# Patient Record
Sex: Female | Born: 1985 | Race: Black or African American | Hispanic: No | Marital: Single | Smoking: Current every day smoker
Health system: Southern US, Community
[De-identification: ages and names within clinical notes are randomized; demographics above are authoritative.]

## PROBLEM LIST (undated history)

## (undated) DIAGNOSIS — Z8619 Personal history of other infectious and parasitic diseases: Secondary | ICD-10-CM

## (undated) DIAGNOSIS — IMO0002 Reserved for concepts with insufficient information to code with codable children: Secondary | ICD-10-CM

## (undated) DIAGNOSIS — M549 Dorsalgia, unspecified: Secondary | ICD-10-CM

## (undated) DIAGNOSIS — F419 Anxiety disorder, unspecified: Secondary | ICD-10-CM

## (undated) DIAGNOSIS — F99 Mental disorder, not otherwise specified: Secondary | ICD-10-CM

## (undated) DIAGNOSIS — Z8742 Personal history of other diseases of the female genital tract: Secondary | ICD-10-CM

## (undated) DIAGNOSIS — N3941 Urge incontinence: Secondary | ICD-10-CM

## (undated) DIAGNOSIS — Z3490 Encounter for supervision of normal pregnancy, unspecified, unspecified trimester: Secondary | ICD-10-CM

## (undated) DIAGNOSIS — Z8709 Personal history of other diseases of the respiratory system: Secondary | ICD-10-CM

## (undated) DIAGNOSIS — O039 Complete or unspecified spontaneous abortion without complication: Secondary | ICD-10-CM

## (undated) DIAGNOSIS — R112 Nausea with vomiting, unspecified: Secondary | ICD-10-CM

## (undated) DIAGNOSIS — IMO0001 Reserved for inherently not codable concepts without codable children: Secondary | ICD-10-CM

## (undated) DIAGNOSIS — O219 Vomiting of pregnancy, unspecified: Secondary | ICD-10-CM

## (undated) DIAGNOSIS — J45909 Unspecified asthma, uncomplicated: Secondary | ICD-10-CM

## (undated) DIAGNOSIS — R87629 Unspecified abnormal cytological findings in specimens from vagina: Secondary | ICD-10-CM

## (undated) HISTORY — DX: Unspecified abnormal cytological findings in specimens from vagina: R87.629

## (undated) HISTORY — DX: Unspecified asthma, uncomplicated: J45.909

## (undated) HISTORY — PX: MOUTH SURGERY: SHX715

## (undated) HISTORY — DX: Personal history of other diseases of the respiratory system: Z87.09

## (undated) HISTORY — DX: Personal history of other diseases of the female genital tract: Z87.42

## (undated) HISTORY — DX: Reserved for concepts with insufficient information to code with codable children: IMO0002

## (undated) HISTORY — DX: Mental disorder, not otherwise specified: F99

## (undated) HISTORY — DX: Encounter for supervision of normal pregnancy, unspecified, unspecified trimester: Z34.90

## (undated) HISTORY — DX: Anxiety disorder, unspecified: F41.9

## (undated) HISTORY — DX: Personal history of other infectious and parasitic diseases: Z86.19

## (undated) HISTORY — DX: Urge incontinence: N39.41

## (undated) HISTORY — DX: Vomiting of pregnancy, unspecified: O21.9

## (undated) HISTORY — DX: Dorsalgia, unspecified: M54.9

## (undated) HISTORY — PX: OTHER SURGICAL HISTORY: SHX169

## (undated) HISTORY — DX: Nausea with vomiting, unspecified: R11.2

---
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
## 2006-10-15 ENCOUNTER — Emergency Department (HOSPITAL_COMMUNITY): Admission: EM | Admit: 2006-10-15 | Discharge: 2006-10-16 | Payer: Self-pay | Admitting: Emergency Medicine

## 2007-05-09 ENCOUNTER — Emergency Department (HOSPITAL_COMMUNITY): Admission: EM | Admit: 2007-05-09 | Discharge: 2007-05-09 | Payer: Self-pay | Admitting: Emergency Medicine

## 2007-05-11 ENCOUNTER — Emergency Department (HOSPITAL_COMMUNITY): Admission: EM | Admit: 2007-05-11 | Discharge: 2007-05-11 | Payer: Self-pay | Admitting: Emergency Medicine

## 2007-06-02 ENCOUNTER — Emergency Department (HOSPITAL_COMMUNITY): Admission: EM | Admit: 2007-06-02 | Discharge: 2007-06-02 | Payer: Self-pay | Admitting: Emergency Medicine

## 2007-07-31 ENCOUNTER — Other Ambulatory Visit: Admission: RE | Admit: 2007-07-31 | Discharge: 2007-07-31 | Payer: Self-pay | Admitting: Obstetrics and Gynecology

## 2007-11-24 ENCOUNTER — Emergency Department (HOSPITAL_COMMUNITY): Admission: EM | Admit: 2007-11-24 | Discharge: 2007-11-24 | Payer: Self-pay | Admitting: Emergency Medicine

## 2008-03-13 ENCOUNTER — Emergency Department (HOSPITAL_COMMUNITY): Admission: EM | Admit: 2008-03-13 | Discharge: 2008-03-13 | Payer: Self-pay | Admitting: Emergency Medicine

## 2009-03-30 ENCOUNTER — Other Ambulatory Visit: Admission: RE | Admit: 2009-03-30 | Discharge: 2009-03-30 | Payer: Self-pay | Admitting: Obstetrics and Gynecology

## 2010-03-01 ENCOUNTER — Emergency Department (HOSPITAL_COMMUNITY)
Admission: EM | Admit: 2010-03-01 | Discharge: 2010-03-01 | Disposition: A | Discharge status: Home / Self Care | Payer: Medicaid Other | Attending: Emergency Medicine | Admitting: Emergency Medicine

## 2010-03-01 ENCOUNTER — Emergency Department (HOSPITAL_COMMUNITY)
Admission: EM | Admit: 2010-03-01 | Discharge: 2010-03-02 | Disposition: A | Discharge status: Home / Self Care | Payer: Medicaid Other | Attending: Emergency Medicine | Admitting: Emergency Medicine

## 2010-03-01 DIAGNOSIS — R5381 Other malaise: Secondary | ICD-10-CM | POA: Insufficient documentation

## 2010-03-01 DIAGNOSIS — M545 Low back pain, unspecified: Secondary | ICD-10-CM | POA: Insufficient documentation

## 2010-03-01 DIAGNOSIS — R5383 Other fatigue: Secondary | ICD-10-CM | POA: Insufficient documentation

## 2010-03-01 LAB — URINALYSIS, ROUTINE W REFLEX MICROSCOPIC
Bilirubin Urine: NEGATIVE
Hgb urine dipstick: NEGATIVE
Ketones, ur: NEGATIVE mg/dL
Nitrite: NEGATIVE
Protein, ur: NEGATIVE mg/dL
Specific Gravity, Urine: >1.03 — ABNORMAL HIGH (ref 1.005–1.030)
Urine Glucose, Fasting: NEGATIVE mg/dL
Urobilinogen, UA: 0.2 mg/dL (ref 0.0–1.0)
pH: 6 (ref 5.0–8.0)

## 2010-03-01 LAB — PREGNANCY, URINE: Preg Test, Ur: NEGATIVE

## 2010-03-02 LAB — URINALYSIS, ROUTINE W REFLEX MICROSCOPIC
Bilirubin Urine: NEGATIVE
Nitrite: NEGATIVE
Protein, ur: NEGATIVE mg/dL
Urobilinogen, UA: 0.2 mg/dL (ref 0.0–1.0)

## 2010-03-02 LAB — PREGNANCY, URINE: Preg Test, Ur: NEGATIVE

## 2010-08-18 ENCOUNTER — Encounter: Payer: Self-pay | Admitting: *Deleted

## 2010-08-18 ENCOUNTER — Emergency Department (HOSPITAL_COMMUNITY)
Admission: EM | Admit: 2010-08-18 | Discharge: 2010-08-19 | Disposition: A | Discharge status: Home / Self Care | Payer: Self-pay | Attending: Emergency Medicine | Admitting: Emergency Medicine

## 2010-08-18 DIAGNOSIS — N939 Abnormal uterine and vaginal bleeding, unspecified: Secondary | ICD-10-CM

## 2010-08-18 DIAGNOSIS — N760 Acute vaginitis: Secondary | ICD-10-CM | POA: Insufficient documentation

## 2010-08-18 DIAGNOSIS — N898 Other specified noninflammatory disorders of vagina: Secondary | ICD-10-CM | POA: Insufficient documentation

## 2010-08-18 DIAGNOSIS — N390 Urinary tract infection, site not specified: Secondary | ICD-10-CM

## 2010-08-18 DIAGNOSIS — B9689 Other specified bacterial agents as the cause of diseases classified elsewhere: Secondary | ICD-10-CM

## 2010-08-18 DIAGNOSIS — F17200 Nicotine dependence, unspecified, uncomplicated: Secondary | ICD-10-CM | POA: Insufficient documentation

## 2010-08-18 DIAGNOSIS — A499 Bacterial infection, unspecified: Secondary | ICD-10-CM | POA: Insufficient documentation

## 2010-08-18 LAB — POCT I-STAT, CHEM 8
BUN: 4 mg/dL — ABNORMAL LOW (ref 6–23)
Creatinine, Ser: 0.6 mg/dL (ref 0.50–1.10)
Glucose, Bld: 88 mg/dL (ref 70–99)
Hemoglobin: 12.6 g/dL (ref 12.0–15.0)
Potassium: 3.4 mEq/L — ABNORMAL LOW (ref 3.5–5.1)

## 2010-08-18 LAB — URINE MICROSCOPIC-ADD ON

## 2010-08-18 LAB — URINALYSIS, ROUTINE W REFLEX MICROSCOPIC
Glucose, UA: NEGATIVE mg/dL
Leukocytes, UA: NEGATIVE
Specific Gravity, Urine: 1.02 (ref 1.005–1.030)
Urobilinogen, UA: 0.2 mg/dL (ref 0.0–1.0)

## 2010-08-18 LAB — PREGNANCY, URINE: Preg Test, Ur: NEGATIVE

## 2010-08-18 NOTE — ED Provider Notes (Signed)
History     Chief Complaint  Patient presents with  . Vaginal Bleeding   HPI  Pt was seen at 2250.  Per pt, c/o gradual onset and persistence of constant vaginal bleeding x2 weeks.  Has been assoc with lower pelvic "cramping" pain.  Has been "sometimes" taking OTC motrin.  Has not seen her usual OB/GYN at [facility] for same.  Denies N/V/D, no rash, no back pain, no fevers, no syncope/near syncope.   History reviewed. No pertinent past medical history.  History reviewed. No pertinent past surgical history.  No family history on file.  History  Substance Use Topics  . Smoking status: Current Everyday Smoker -- 0.5 packs/day    Types: Cigarettes  . Smokeless tobacco: Not on file  . Alcohol Use: No    OB History    Grav Para Term Preterm Abortions TAB SAB Ect Mult Living                  Review of Systems ROS: Statement: All systems negative except as marked or noted in the HPI; Constitutional: Negative for fever and chills. ; ; Eyes: Negative for eye pain and discharge. ; ; ENMT: Negative for ear pain, hoarseness, nasal congestion, sinus pressure and sore throat. ; ; Cardiovascular: Negative for chest pain, palpitations, diaphoresis, dyspnea and peripheral edema. ; ; Respiratory: Negative for cough, wheezing and stridor. ; ; Gastrointestinal: Negative for nausea, vomiting, diarrhea and abdominal pain. ; ; Genitourinary: Negative for dysuria, flank pain and hematuria. +vaginal bleeding and pelvic cramping ; ; Musculoskeletal: Negative for back pain and neck pain. ; ; Skin: Negative for rash and skin lesion. ; ; Neuro: Negative for headache, lightheadedness and neck stiffness. ;    Physical Exam  BP 125/65  Pulse 82  Temp(Src) 98.5 F (36.9 C) (Oral)  Resp 18  Ht 5\' 2"  (1.575 m)  Wt 165 lb (74.844 kg)  BMI 30.18 kg/m2  SpO2 100%  LMP 08/04/2010  Physical Exam 2300: Physical examination:  Nursing notes reviewed; Vital signs and O2 SAT reviewed;  Constitutional: Well  developed, Well nourished, Well hydrated, In no acute distress; Head:  Normocephalic, atraumatic; Eyes: EOMI, PERRL, No scleral icterus; ENMT: Mouth and pharynx normal, Mucous membranes moist; Neck: Supple, Full range of motion, No lymphadenopathy; Cardiovascular: Regular rate and rhythm, No murmur, rub, or gallop; Respiratory: Breath sounds clear & equal bilaterally, No rales, rhonchi, wheezes, or rub, Normal respiratory effort/excursion; Chest: Nontender, Movement normal; Abdomen: Soft, Nontender, Nondistended, Normal bowel sounds; Genitourinary: No CVA tenderness; Pelvic exam performed with permission of pt and female ED tech assist during exam.  External genitalia w/o lesions. Vaginal vault without discharge, +dark blood in vault.  Cervix w/o lesions, not friable, GC/chlam and wet prep obtained and sent to lab.  Bimanual exam w/o CMT, uterine or adenexal tenderness. Extremities: Pulses normal, No tenderness, No edema, No calf edema or asymmetry.; Neuro: AA&Ox3, Major CN grossly intact.  No gross focal motor or sensory deficits in extremities.; Skin: Color normal, Warm, Dry.   ED Course  Procedures  MDM MDM Reviewed: vitals and nursing note Interpretation: labs   Results for orders placed during the hospital encounter of 08/18/10  URINALYSIS, ROUTINE W REFLEX MICROSCOPIC      Component Value Range   Color, Urine YELLOW  YELLOW    Appearance HAZY (*) CLEAR    Specific Gravity, Urine 1.020  1.005 - 1.030    pH 7.0  5.0 - 8.0    Glucose, UA NEGATIVE  NEGATIVE (mg/dL)   Hgb urine dipstick TRACE (*) NEGATIVE    Bilirubin Urine NEGATIVE  NEGATIVE    Ketones, ur NEGATIVE  NEGATIVE (mg/dL)   Protein, ur NEGATIVE  NEGATIVE (mg/dL)   Urobilinogen, UA 0.2  0.0 - 1.0 (mg/dL)   Nitrite NEGATIVE  NEGATIVE    Leukocytes, UA NEGATIVE  NEGATIVE   PREGNANCY, URINE      Component Value Range   Preg Test, Ur NEGATIVE    WET PREP, GENITAL      Component Value Range   Yeast, Wet Prep NONE SEEN  NONE  SEEN    Trich, Wet Prep NONE SEEN  NONE SEEN    Clue Cells, Wet Prep FEW (*) NONE SEEN    WBC, Wet Prep HPF POC FEW (*) NONE SEEN   POCT I-STAT, CHEM 8      Component Value Range   Sodium 141  135 - 145 (mEq/L)   Potassium 3.4 (*) 3.5 - 5.1 (mEq/L)   Chloride 107  96 - 112 (mEq/L)   BUN 4 (*) 6 - 23 (mg/dL)   Creatinine, Ser 6.44  0.50 - 1.10 (mg/dL)   Glucose, Bld 88  70 - 99 (mg/dL)   Calcium, Ion 0.34  7.42 - 1.32 (mmol/L)   TCO2 23  0 - 100 (mmol/L)   Hemoglobin 12.6  12.0 - 15.0 (g/dL)   HCT 59.5  63.8 - 75.6 (%)  URINE MICROSCOPIC-ADD ON      Component Value Range   Squamous Epithelial / LPF FEW (*) RARE    WBC, UA 0-2  <3 (WBC/hpf)   RBC / HPF 3-6  <3 (RBC/hpf)   Bacteria, UA MANY (*) RARE    Urine-Other MUCOUS PRESENT       1:50 AM:  +BV, UTI.  GC/chlam pending.  Wants to go home now.  Dx testing d/w pt.  Questions answered.  Verb understanding, agreeable to d/c home with outpt f/u.    Aseneth Hack Allison Quarry, DO 08/19/10 1656

## 2010-08-18 NOTE — ED Notes (Signed)
C/o vaginal bleeding and lower abd cramping; reports bleeding heavy x 2 weeks; states is using a pad q56min

## 2010-08-19 LAB — WET PREP, GENITAL: Yeast Wet Prep HPF POC: NONE SEEN

## 2010-08-19 MED ORDER — METRONIDAZOLE 500 MG PO TABS: 500.0000 mg | ORAL_TABLET | Freq: Two times a day (BID) | ORAL | Status: AC

## 2010-08-19 MED ORDER — IBUPROFEN 400 MG PO TABS
400.0000 mg | ORAL_TABLET | Freq: Once | ORAL | Status: AC
  Administered 2010-08-19: 400 mg via ORAL
  Filled 2010-08-19 (×2): qty 1

## 2010-08-19 MED ORDER — CEPHALEXIN 500 MG PO CAPS: 500.0000 mg | ORAL_CAPSULE | Freq: Four times a day (QID) | ORAL | Status: AC

## 2010-08-20 LAB — GC/CHLAMYDIA PROBE AMP, GENITAL
Chlamydia, DNA Probe: NEGATIVE
GC Probe Amp, Genital: NEGATIVE

## 2010-10-18 LAB — DIFFERENTIAL
Basophils Relative: 1
Eosinophils Absolute: 0.2
Eosinophils Relative: 3
Lymphs Abs: 2.3
Neutrophils Relative %: 49

## 2010-10-18 LAB — URINALYSIS, ROUTINE W REFLEX MICROSCOPIC
Bilirubin Urine: NEGATIVE
Ketones, ur: 40 — AB
Nitrite: NEGATIVE
Protein, ur: NEGATIVE
Urobilinogen, UA: 0.2
pH: 5.5

## 2010-10-18 LAB — CBC
HCT: 35.9 — ABNORMAL LOW
MCHC: 34.9
MCV: 87.2
Platelets: 234
RDW: 13.4
WBC: 6.2

## 2010-10-18 LAB — BASIC METABOLIC PANEL
BUN: 5 — ABNORMAL LOW
CO2: 22
Chloride: 103
Creatinine, Ser: 0.52
Glucose, Bld: 92
Potassium: 3.6

## 2010-10-18 LAB — PREGNANCY, URINE: Preg Test, Ur: POSITIVE

## 2010-10-18 LAB — URINE MICROSCOPIC-ADD ON

## 2010-11-25 ENCOUNTER — Encounter (HOSPITAL_COMMUNITY): Payer: Self-pay | Admitting: *Deleted

## 2010-11-25 ENCOUNTER — Emergency Department (HOSPITAL_COMMUNITY)
Admission: EM | Admit: 2010-11-25 | Discharge: 2010-11-25 | Disposition: A | Discharge status: Home / Self Care | Payer: Medicaid Other | Attending: Emergency Medicine | Admitting: Emergency Medicine

## 2010-11-25 DIAGNOSIS — J3489 Other specified disorders of nose and nasal sinuses: Secondary | ICD-10-CM | POA: Insufficient documentation

## 2010-11-25 DIAGNOSIS — S46919A Strain of unspecified muscle, fascia and tendon at shoulder and upper arm level, unspecified arm, initial encounter: Secondary | ICD-10-CM

## 2010-11-25 DIAGNOSIS — F17200 Nicotine dependence, unspecified, uncomplicated: Secondary | ICD-10-CM | POA: Insufficient documentation

## 2010-11-25 DIAGNOSIS — IMO0002 Reserved for concepts with insufficient information to code with codable children: Secondary | ICD-10-CM | POA: Insufficient documentation

## 2010-11-25 MED ORDER — CARISOPRODOL 350 MG PO TABS: 350.0000 mg | ORAL_TABLET | Freq: Three times a day (TID) | ORAL | Status: AC

## 2010-11-25 MED ORDER — HYDROCODONE-ACETAMINOPHEN 5-325 MG PO TABS: 1.0000 | ORAL_TABLET | ORAL | Status: AC | PRN

## 2010-11-25 NOTE — ED Notes (Signed)
Pt a/ox4. Resp even and unlabored. NAD at this time. D/C instructions and Rx x 2 reviewed with pt. Pt verbalized understanding. Sling applied to left arm/shoulder. Pt ambulated to lobby to wait for her ride. Pt ambulated with steady gate.
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
## 2010-11-25 NOTE — ED Provider Notes (Signed)
Evaluation and management procedures were performed by the mid-level provider (PA/NP/CNM) under my supervision/collaboration. I was present and available during the ED course. Lynita Groseclose Y.   Gavin Pound. Oletta Lamas, MD 11/25/10 [phone]
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
## 2011-04-15 ENCOUNTER — Encounter (HOSPITAL_COMMUNITY): Payer: Self-pay | Admitting: Emergency Medicine

## 2011-04-15 ENCOUNTER — Emergency Department (HOSPITAL_COMMUNITY)
Admission: EM | Admit: 2011-04-15 | Discharge: 2011-04-15 | Discharge status: Against Medical Advice | Payer: Self-pay | Attending: Emergency Medicine | Admitting: Emergency Medicine

## 2011-04-15 ENCOUNTER — Emergency Department (HOSPITAL_COMMUNITY)
Admission: EM | Admit: 2011-04-15 | Discharge: 2011-04-15 | Disposition: A | Discharge status: Home / Self Care | Payer: Medicaid Other | Attending: Emergency Medicine | Admitting: Emergency Medicine

## 2011-04-15 ENCOUNTER — Encounter (HOSPITAL_COMMUNITY): Payer: Self-pay | Admitting: *Deleted

## 2011-04-15 DIAGNOSIS — K029 Dental caries, unspecified: Secondary | ICD-10-CM | POA: Insufficient documentation

## 2011-04-15 DIAGNOSIS — F17200 Nicotine dependence, unspecified, uncomplicated: Secondary | ICD-10-CM | POA: Insufficient documentation

## 2011-04-15 DIAGNOSIS — K0889 Other specified disorders of teeth and supporting structures: Secondary | ICD-10-CM

## 2011-04-15 DIAGNOSIS — K047 Periapical abscess without sinus: Secondary | ICD-10-CM | POA: Insufficient documentation

## 2011-04-15 MED ORDER — HYDROCODONE-ACETAMINOPHEN 5-325 MG PO TABS: ORAL_TABLET | ORAL | Status: AC

## 2011-04-15 MED ORDER — PENICILLIN V POTASSIUM 500 MG PO TABS: 500.0000 mg | ORAL_TABLET | Freq: Four times a day (QID) | ORAL | Status: AC

## 2011-04-15 NOTE — ED Notes (Signed)
Pt states left for work purposes however is here now to be treated for her tooth ache. Pt has noted swelling in rt lower jaw with several dental caries noted. Pt denies fever.

## 2011-04-15 NOTE — ED Provider Notes (Signed)
History     CSN: [id number]  Arrival date & time 04/15/11  1518   First MD Initiated Contact with Patient 04/15/11 1524      Chief Complaint  Patient presents with  . Dental Pain    (Consider location/radiation/quality/duration/timing/severity/associated sxs/prior treatment) HPI Comments: Patient comes emergency department with complaint of pain and swelling to her right lower bicuspids and surrounding gums. She states that she woke this morning with swelling to her face. She is concerned that she is developing a dental abscess. She came to the emergency department earlier today but left prior to being seen by the physician. She states she does have a dentist to followup with. She denies fever vomiting difficulty swallowing or breathing.  Patient is a 26 y.o. female presenting with tooth pain. The history is provided by the patient. No language interpreter was used.  Dental PainThe primary symptoms include mouth pain. Primary symptoms do not include dental injury, oral bleeding, oral lesions, headaches, fever, sore throat, angioedema or cough. The symptoms began 6 to 12 hours ago. The symptoms are worsening. The symptoms are new. The symptoms occur constantly.  Additional symptoms include: dental sensitivity to temperature, gum swelling, gum tenderness and facial swelling. Additional symptoms do not include: purulent gums, trismus, jaw pain, trouble swallowing, drooling and swollen glands. Medical issues include: smoking and periodontal disease.    History reviewed. No pertinent past medical history.  Past Surgical History  Procedure Date  . Mouth surgery     History reviewed. No pertinent family history.  History  Substance Use Topics  . Smoking status: Current Everyday Smoker -- 0.5 packs/day    Types: Cigarettes  . Smokeless tobacco: Not on file  . Alcohol Use: Yes    OB History    Grav Para Term Preterm Abortions TAB SAB Ect Mult Living                  Review of  Systems  Constitutional: Negative for fever.  HENT: Positive for facial swelling and dental problem. Negative for sore throat, drooling, mouth sores, trouble swallowing and neck pain.   Respiratory: Negative for cough.   Skin: Negative.   Neurological: Negative for headaches.  Hematological: Negative for adenopathy.  All other systems reviewed and are negative.    Allergies  Review of patient's allergies indicates no known allergies.  Home Medications  No current outpatient prescriptions on file.  LMP 04/14/2011  Physical Exam  Nursing note and vitals reviewed. Constitutional: She is oriented to person, place, and time. She appears well-developed and well-nourished. No distress.  HENT:  Head: Normocephalic and atraumatic. No trismus in the jaw.  Mouth/Throat: Uvula is midline, oropharynx is clear and moist and mucous membranes are normal. No oral lesions. Dental abscesses and dental caries present. No uvula swelling or lacerations.         Dental caries with mild swelling and tenderness of the surrounding gums. Mild adjacent swelling of the face. No trismus, airway is patent.  Neck: Normal range of motion. Neck supple.  Cardiovascular: Normal rate, regular rhythm and normal heart sounds.   Pulmonary/Chest: Effort normal and breath sounds normal. No respiratory distress.  Lymphadenopathy:    She has no cervical adenopathy.  Neurological: She is alert and oriented to person, place, and time. She exhibits normal muscle tone. Coordination normal.  Skin: Skin is warm and dry.    ED Course  Procedures (including critical care time)       MDM    Vital signs  are stable. Patient is nontoxic appearing. Mild facial swelling with probable early dental abscess formation. I will begin patient on antibiotics and a short course of pain medication she agrees to close followup with her dentist  Patient / Family / Caregiver understand and agree with initial ED impression and plan with  expectations set for ED visit. Pt stable in ED with no significant deterioration in condition. Pt feels improved after observation and/or treatment in ED.        [name], [place] 04/15/11 1538

## 2011-04-15 NOTE — ED Notes (Signed)
Pt returned to Ed for dental abscess pain to rt lower front jaw.

## 2011-04-15 NOTE — Discharge Instructions (Signed)
Dental Pain  A tooth ache may be caused by cavities (tooth decay). Cavities expose the nerve of the tooth to air and hot or cold temperatures. It may come from an infection or abscess (also called a boil or furuncle) around your tooth. It is also often caused by dental caries (tooth decay). This causes the pain you are having.  DIAGNOSIS   Your caregiver can diagnose this problem by exam.  TREATMENT   · If caused by an infection, it may be treated with medications which kill germs (antibiotics) and pain medications as prescribed by your caregiver. Take medications as directed.  · Only take over-the-counter or prescription medicines for pain, discomfort, or fever as directed by your caregiver.  · Whether the tooth ache today is caused by infection or dental disease, you should see your dentist as soon as possible for further care.  SEEK MEDICAL CARE IF:  The exam and treatment you received today has been provided on an emergency basis only. This is not a substitute for complete medical or dental care. If your problem worsens or new problems (symptoms) appear, and you are unable to meet with your dentist, call or return to this location.  SEEK IMMEDIATE MEDICAL CARE IF:   · You have a fever.  · You develop redness and swelling of your face, jaw, or neck.  · You are unable to open your mouth.  · You have severe pain uncontrolled by pain medicine.  MAKE SURE YOU:   · Understand these instructions.  · Will watch your condition.  · Will get help right away if you are not doing well or get worse.  Document Released: 01/09/2005 Document Revised: 12/29/2010 Document Reviewed: 08/28/2007  [employer]® Patient Information ©2012 [employer].

## 2011-04-15 NOTE — ED Notes (Signed)
Pt left without being seen stating she needed to get to work.

## 2011-04-15 NOTE — ED Notes (Signed)
Pt c/o mouth pain since this morning.

## 2011-04-17 NOTE — ED Provider Notes (Signed)
Medical screening examination/treatment/procedure(s) were performed by non-physician practitioner and as supervising physician I was immediately available for consultation/collaboration.  [name], MD 04/17/11 [phone]

## 2011-04-18 ENCOUNTER — Emergency Department (HOSPITAL_COMMUNITY)
Admission: EM | Admit: 2011-04-18 | Discharge: 2011-04-19 | Disposition: A | Discharge status: Home / Self Care | Payer: Medicaid Other | Attending: Emergency Medicine | Admitting: Emergency Medicine

## 2011-04-18 ENCOUNTER — Encounter (HOSPITAL_COMMUNITY): Payer: Self-pay | Admitting: *Deleted

## 2011-04-18 DIAGNOSIS — K0889 Other specified disorders of teeth and supporting structures: Secondary | ICD-10-CM

## 2011-04-18 DIAGNOSIS — K047 Periapical abscess without sinus: Secondary | ICD-10-CM | POA: Insufficient documentation

## 2011-04-18 DIAGNOSIS — F17200 Nicotine dependence, unspecified, uncomplicated: Secondary | ICD-10-CM | POA: Insufficient documentation

## 2011-04-18 NOTE — ED Notes (Signed)
Pt reports her dental abcess broke open earlier tonight.  States that "I don't know if I swallowed some or not, but now I'm feeling nauseated."  Reports that she had been taking her antibiotics as directed.

## 2011-04-19 MED ORDER — ONDANSETRON 4 MG PO TBDP
4.0000 mg | ORAL_TABLET | Freq: Once | ORAL | Status: AC
  Administered 2011-04-19: 4 mg via ORAL
  Filled 2011-04-19: qty 1

## 2011-04-19 MED ORDER — OXYCODONE-ACETAMINOPHEN 5-325 MG PO TABS
2.0000 | ORAL_TABLET | Freq: Once | ORAL | Status: AC
  Administered 2011-04-19: 2 via ORAL
  Filled 2011-04-19: qty 2

## 2011-04-19 MED ORDER — PROMETHAZINE HCL 25 MG PO TABS: 25.0000 mg | ORAL_TABLET | Freq: Four times a day (QID) | ORAL | Status: DC | PRN

## 2011-04-19 NOTE — ED Notes (Signed)
Left in c/o mother for transport home; a&ox4; in no distress. 

## 2011-04-19 NOTE — Discharge Instructions (Signed)
Follow up with the dentist as planned °

## 2011-04-20 NOTE — ED Provider Notes (Signed)
History     CSN: [id number]  Arrival date & time 04/18/11  2310   First MD Initiated Contact with Patient 04/19/11 0125      No chief complaint on file.   (Consider location/radiation/quality/duration/timing/severity/associated sxs/prior treatment) Patient is a 26 y.o. female presenting with tooth pain. The history is provided by the patient (pt complains of painful right lower tooth). No language interpreter was used.  Dental PainThe primary symptoms include mouth pain. Primary symptoms do not include headaches or cough. The symptoms began more than 1 week ago. The symptoms are worsening. The symptoms are chronic. The symptoms occur constantly.  Additional symptoms include: gum swelling. Additional symptoms do not include: fatigue. Medical issues do not include: alcohol problem.    History reviewed. No pertinent past medical history.  Past Surgical History  Procedure Date  . Mouth surgery     History reviewed. No pertinent family history.  History  Substance Use Topics  . Smoking status: Current Everyday Smoker -- 0.5 packs/day    Types: Cigarettes  . Smokeless tobacco: Not on file  . Alcohol Use: Yes    OB History    Grav Para Term Preterm Abortions TAB SAB Ect Mult Living                  Review of Systems  Constitutional: Negative for fatigue.  HENT: Negative for congestion, sinus pressure and ear discharge.        Toothache  Eyes: Negative for discharge.  Respiratory: Negative for cough.   Cardiovascular: Negative for chest pain.  Gastrointestinal: Negative for abdominal pain and diarrhea.  Genitourinary: Negative for frequency and hematuria.  Musculoskeletal: Negative for back pain.  Skin: Negative for rash.  Neurological: Negative for seizures and headaches.  Hematological: Negative.   Psychiatric/Behavioral: Negative for hallucinations.    Allergies  Review of patient's allergies indicates no known allergies.  Home Medications   Current Outpatient  Rx  Name Route Sig Dispense Refill  . HYDROCODONE-ACETAMINOPHEN 5-325 MG PO TABS  Take one-two tabs po q 4-6 hrs prn pain 20 tablet 0  . PENICILLIN V POTASSIUM 500 MG PO TABS Oral Take 1 tablet (500 mg total) by mouth 4 (four) times daily. 40 tablet 0  . PROMETHAZINE HCL 25 MG PO TABS Oral Take 1 tablet (25 mg total) by mouth every 6 (six) hours as needed for nausea. 15 tablet 0    BP 111/73  Pulse 84  Temp 97.5 F (36.4 C)  Resp 18  Ht 5\' 2"  (1.575 m)  Wt 156 lb (70.761 kg)  BMI 28.53 kg/m2  SpO2 100%  LMP 04/14/2011  Physical Exam  Constitutional: She is oriented to person, place, and time. She appears well-developed.  HENT:  Head: Normocephalic.       Tender abscess tooth right lower  Eyes: Conjunctivae are normal.  Neck: No tracheal deviation present.  Cardiovascular:  No murmur heard. Musculoskeletal: Normal range of motion.  Neurological: She is oriented to person, place, and time.  Skin: Skin is warm.  Psychiatric: She has a normal mood and affect.    ED Course  Procedures (including critical care time)  Labs Reviewed - No data to display No results found.   1. Toothache       MDM  Abscess tooth,  Pt to follow up with dentist and continue her antibiotic        Benny Lennert, MD 04/20/11 [phone]

## 2011-06-05 ENCOUNTER — Encounter (HOSPITAL_COMMUNITY): Payer: Self-pay | Admitting: *Deleted

## 2011-06-05 ENCOUNTER — Emergency Department (HOSPITAL_COMMUNITY)
Admission: EM | Admit: 2011-06-05 | Discharge: 2011-06-05 | Disposition: A | Discharge status: Home / Self Care | Payer: Medicaid Other | Attending: Emergency Medicine | Admitting: Emergency Medicine

## 2011-06-05 DIAGNOSIS — F17200 Nicotine dependence, unspecified, uncomplicated: Secondary | ICD-10-CM | POA: Insufficient documentation

## 2011-06-05 DIAGNOSIS — H6690 Otitis media, unspecified, unspecified ear: Secondary | ICD-10-CM | POA: Insufficient documentation

## 2011-06-05 DIAGNOSIS — H6691 Otitis media, unspecified, right ear: Secondary | ICD-10-CM

## 2011-06-05 DIAGNOSIS — J029 Acute pharyngitis, unspecified: Secondary | ICD-10-CM

## 2011-06-05 MED ORDER — ANTIPYRINE-BENZOCAINE 5.4-1.4 % OT SOLN
3.0000 [drp] | Freq: Once | OTIC | Status: AC
  Administered 2011-06-05: 3 [drp] via OTIC
  Filled 2011-06-05: qty 10

## 2011-06-05 MED ORDER — IBUPROFEN 800 MG PO TABS
800.0000 mg | ORAL_TABLET | Freq: Once | ORAL | Status: AC
  Administered 2011-06-05: 800 mg via ORAL
  Filled 2011-06-05: qty 1

## 2011-06-05 MED ORDER — AMOXICILLIN 500 MG PO CAPS: 500.0000 mg | ORAL_CAPSULE | Freq: Three times a day (TID) | ORAL | Status: AC

## 2011-06-05 MED ORDER — HYDROCODONE-ACETAMINOPHEN 5-325 MG PO TABS
1.0000 | ORAL_TABLET | Freq: Once | ORAL | Status: AC
  Administered 2011-06-05: 1 via ORAL
  Filled 2011-06-05: qty 1

## 2011-06-05 MED ORDER — HYDROCODONE-ACETAMINOPHEN 5-325 MG PO TABS: 1.0000 | ORAL_TABLET | Freq: Four times a day (QID) | ORAL | Status: AC | PRN

## 2011-06-05 MED ORDER — AMOXICILLIN 250 MG PO CAPS
500.0000 mg | ORAL_CAPSULE | Freq: Once | ORAL | Status: AC
Start: 2011-06-05 — End: 2011-06-05
  Administered 2011-06-05: 500 mg via ORAL
  Filled 2011-06-05: qty 2

## 2011-06-05 NOTE — ED Notes (Signed)
Right earache with sore throat and fever since Thursday per pt, denies any hx of ear infection

## 2011-06-05 NOTE — Discharge Instructions (Signed)
Otitis Media, Adult A middle ear infection is an infection in the space behind the eardrum. It often happens along with a cold. It is caused by a germ that starts growing in that space. Your neck may feel puffy (swollen) on the side of the ear infection. HOME CARE  Take your medicine as told. Finish it even if you start to feel better.   Nose medicine (nasal decongestant) may help the tube that connects the ear and throat (eustachian tube) drain better. It may also help with discomfort.   Follow up with your doctor in 10 to 14 days or as told by your doctor. This is to make sure the infection is gone.  GET HELP RIGHT AWAY IF:   You do not start to feel better in 2 to 3 days.   You have pain that is not helped with medicine.   You cannot use the medicine as told.   You feel worse instead of better.   You develop puffiness, redness, or pain around the ear.   You get a stiff neck.  MAKE SURE YOU:   Understand these instructions.   Will watch your condition.   Will get help right away if you are not doing well or get worse.  Document Released: 06/28/2007 Document Revised: 12/29/2010 Document Reviewed: 06/28/2007 [facility] Patient Information 2012 [place], [place].   Take the antibiotic and pain medicine as directed.  Take ibuprofen 800 mg every 8 hrs with food.  Follow up with your MD at the health dept.

## 2011-06-05 NOTE — ED Notes (Signed)
Rt earache with sore throat.

## 2011-06-05 NOTE — ED Provider Notes (Signed)
History     CSN: [id number]  Arrival date & time 06/05/11  1732   First MD Initiated Contact with Patient 06/05/11 2015      Chief Complaint  Patient presents with  . Otalgia    (Consider location/radiation/quality/duration/timing/severity/associated sxs/prior treatment) Patient is a 26 y.o. female presenting with ear pain. The history is provided by the patient. No language interpreter was used.  Otalgia This is a new problem. Episode onset: 4 days ago. There is pain in the right ear. The problem occurs constantly. The problem has not changed since onset.There has been no fever. The pain is severe. Associated symptoms include sore throat. Pertinent negatives include no ear discharge, no hearing loss, no vomiting and no cough. Her past medical history does not include chronic ear infection, hearing loss or tympanostomy tube.    History reviewed. No pertinent past medical history.  Past Surgical History  Procedure Date  . Mouth surgery     History reviewed. No pertinent family history.  History  Substance Use Topics  . Smoking status: Current Everyday Smoker -- 0.5 packs/day    Types: Cigarettes  . Smokeless tobacco: Not on file  . Alcohol Use: Yes    OB History    Grav Para Term Preterm Abortions TAB SAB Ect Mult Living                  Review of Systems  HENT: Positive for ear pain and sore throat. Negative for hearing loss and ear discharge.   Respiratory: Negative for cough.   Gastrointestinal: Negative for vomiting.  All other systems reviewed and are negative.    Allergies  Review of patient's allergies indicates no known allergies.  Home Medications   Current Outpatient Rx  Name Route Sig Dispense Refill  . AMOXICILLIN 500 MG PO CAPS Oral Take 1 capsule (500 mg total) by mouth 3 (three) times daily. 30 capsule 0  . HYDROCODONE-ACETAMINOPHEN 5-325 MG PO TABS Oral Take 1 tablet by mouth every 6 (six) hours as needed for pain. 20 tablet 0    BP 130/82   Pulse 86  Temp(Src) 99.2 F (37.3 C) (Oral)  Resp 20  Ht 5\' 2"  (1.575 m)  Wt 152 lb 6.4 oz (69.128 kg)  BMI 27.87 kg/m2  SpO2 100%  LMP 06/05/2011  Physical Exam  Nursing note and vitals reviewed. Constitutional: She is oriented to person, place, and time. She appears well-developed and well-nourished. No distress.  HENT:  Head: Normocephalic and atraumatic. No trismus in the jaw.  Right Ear: Hearing, external ear and ear canal normal. No drainage. Tympanic membrane is erythematous and bulging. Tympanic membrane is not perforated. No hemotympanum. No decreased hearing is noted.  Left Ear: Hearing, tympanic membrane, external ear and ear canal normal.  Nose: Nose normal.  Mouth/Throat: Uvula is midline and mucous membranes are normal. No uvula swelling. Posterior oropharyngeal erythema present. No oropharyngeal exudate, posterior oropharyngeal edema or tonsillar abscesses.  Eyes: EOM are normal. Right eye exhibits no discharge. Left eye exhibits no discharge.  Neck: Normal range of motion.  Cardiovascular: Normal rate, regular rhythm and normal heart sounds.   Pulmonary/Chest: Effort normal and breath sounds normal.  Abdominal: Soft. She exhibits no distension. There is no tenderness.  Musculoskeletal: Normal range of motion.  Lymphadenopathy:    She has cervical adenopathy.  Neurological: She is alert and oriented to person, place, and time.  Skin: Skin is warm and dry.  Psychiatric: She has a normal mood and affect. Judgment normal.  ED Course  Procedures (including critical care time)  Labs Reviewed - No data to display No results found.   1. Right otitis media   2. Pharyngitis       MDM  rx-amoxicillin 500 mg TID, 30 rx-hydrocodone, 12 Ibuprofen 800 mg TID   auralgan otic        Worthy Rancher, PA 06/05/11 2122

## 2011-06-08 NOTE — ED Provider Notes (Signed)
Medical screening examination/treatment/procedure(s) were performed by non-physician practitioner and as supervising physician I was immediately available for consultation/collaboration.  Alistair Senft, MD 06/08/11 0716 

## 2011-08-24 ENCOUNTER — Encounter (HOSPITAL_COMMUNITY): Payer: Self-pay | Admitting: *Deleted

## 2011-08-24 ENCOUNTER — Emergency Department (HOSPITAL_COMMUNITY)
Admission: EM | Admit: 2011-08-24 | Discharge: 2011-08-24 | Disposition: A | Discharge status: Home / Self Care | Payer: Medicaid Other | Attending: Emergency Medicine | Admitting: Emergency Medicine

## 2011-08-24 DIAGNOSIS — Z711 Person with feared health complaint in whom no diagnosis is made: Secondary | ICD-10-CM

## 2011-08-24 DIAGNOSIS — R350 Frequency of micturition: Secondary | ICD-10-CM | POA: Insufficient documentation

## 2011-08-24 DIAGNOSIS — F17200 Nicotine dependence, unspecified, uncomplicated: Secondary | ICD-10-CM | POA: Insufficient documentation

## 2011-08-24 LAB — URINALYSIS, ROUTINE W REFLEX MICROSCOPIC
Ketones, ur: NEGATIVE mg/dL
Leukocytes, UA: NEGATIVE
Nitrite: NEGATIVE
Protein, ur: NEGATIVE mg/dL
pH: 6.5 (ref 5.0–8.0)

## 2011-08-24 NOTE — ED Notes (Signed)
Pt here for urinary freq, several days, also wants pregnancy test, also may have ear infection and yeast infection

## 2011-08-24 NOTE — ED Notes (Addendum)
Urinary frequency for 2 weeks, no dysruia, no vag d/c.  No fever or chills, no N/V.  Thinks she may have a yeast infection.also.  Pain lt ear.  Pt only spotted in June,  LMP in May, Has not taken a preg test.
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
## 2011-08-26 NOTE — ED Provider Notes (Signed)
Medical screening examination/treatment/procedure(s) were performed by non-physician practitioner and as supervising physician I was immediately available for consultation/collaboration.  Cayenne Breault T Jencarlos Nicolson, MD 08/26/11 1542 

## 2011-09-17 ENCOUNTER — Emergency Department (HOSPITAL_COMMUNITY): Payer: Medicaid Other

## 2011-09-17 ENCOUNTER — Emergency Department (HOSPITAL_COMMUNITY)
Admission: EM | Admit: 2011-09-17 | Discharge: 2011-09-17 | Disposition: A | Discharge status: Home / Self Care | Payer: Medicaid Other | Attending: Emergency Medicine | Admitting: Emergency Medicine

## 2011-09-17 ENCOUNTER — Encounter (HOSPITAL_COMMUNITY): Payer: Self-pay | Admitting: Emergency Medicine

## 2011-09-17 DIAGNOSIS — B349 Viral infection, unspecified: Secondary | ICD-10-CM

## 2011-09-17 DIAGNOSIS — R05 Cough: Secondary | ICD-10-CM | POA: Insufficient documentation

## 2011-09-17 DIAGNOSIS — S1093XA Contusion of unspecified part of neck, initial encounter: Secondary | ICD-10-CM | POA: Insufficient documentation

## 2011-09-17 DIAGNOSIS — J342 Deviated nasal septum: Secondary | ICD-10-CM

## 2011-09-17 DIAGNOSIS — K047 Periapical abscess without sinus: Secondary | ICD-10-CM | POA: Insufficient documentation

## 2011-09-17 DIAGNOSIS — B9789 Other viral agents as the cause of diseases classified elsewhere: Secondary | ICD-10-CM | POA: Insufficient documentation

## 2011-09-17 DIAGNOSIS — S0003XA Contusion of scalp, initial encounter: Secondary | ICD-10-CM | POA: Insufficient documentation

## 2011-09-17 DIAGNOSIS — R51 Headache: Secondary | ICD-10-CM | POA: Insufficient documentation

## 2011-09-17 DIAGNOSIS — F17200 Nicotine dependence, unspecified, uncomplicated: Secondary | ICD-10-CM | POA: Insufficient documentation

## 2011-09-17 DIAGNOSIS — R059 Cough, unspecified: Secondary | ICD-10-CM | POA: Insufficient documentation

## 2011-09-17 DIAGNOSIS — S0083XA Contusion of other part of head, initial encounter: Secondary | ICD-10-CM

## 2011-09-17 LAB — COMPREHENSIVE METABOLIC PANEL WITH GFR
ALT: 11 U/L (ref 0–35)
AST: 14 U/L (ref 0–37)
Albumin: 3.8 g/dL (ref 3.5–5.2)
Alkaline Phosphatase: 60 U/L (ref 39–117)
BUN: 5 mg/dL — ABNORMAL LOW (ref 6–23)
CO2: 26 meq/L (ref 19–32)
Calcium: 9.2 mg/dL (ref 8.4–10.5)
Chloride: 99 meq/L (ref 96–112)
Creatinine, Ser: 0.65 mg/dL (ref 0.50–1.10)
GFR calc Af Amer: >90 mL/min
GFR calc non Af Amer: >90 mL/min
Glucose, Bld: 97 mg/dL (ref 70–99)
Potassium: 3.4 meq/L — ABNORMAL LOW (ref 3.5–5.1)
Sodium: 133 meq/L — ABNORMAL LOW (ref 135–145)
Total Bilirubin: 0.2 mg/dL — ABNORMAL LOW (ref 0.3–1.2)
Total Protein: 7.1 g/dL (ref 6.0–8.3)

## 2011-09-17 LAB — URINALYSIS, ROUTINE W REFLEX MICROSCOPIC
Bilirubin Urine: NEGATIVE
Hgb urine dipstick: NEGATIVE
Protein, ur: NEGATIVE mg/dL
Urobilinogen, UA: 0.2 mg/dL (ref 0.0–1.0)

## 2011-09-17 LAB — CBC WITH DIFFERENTIAL/PLATELET
Basophils Absolute: 0 10*3/uL (ref 0.0–0.1)
Eosinophils Absolute: 0.5 10*3/uL (ref 0.0–0.7)
Eosinophils Relative: 8 % — ABNORMAL HIGH (ref 0–5)
MCH: 29.8 pg (ref 26.0–34.0)
MCV: 88.6 fL (ref 78.0–100.0)
Platelets: 195 10*3/uL (ref 150–400)
RDW: 13.1 % (ref 11.5–15.5)

## 2011-09-17 MED ORDER — SODIUM CHLORIDE 0.9 % IV SOLN: INTRAVENOUS | Status: DC

## 2011-09-17 MED ORDER — SODIUM CHLORIDE 0.9 % IV BOLUS (SEPSIS)
1000.0000 mL | Freq: Once | INTRAVENOUS | Status: AC
  Administered 2011-09-17: 1000 mL via INTRAVENOUS

## 2011-09-17 MED ORDER — ONDANSETRON HCL 4 MG/2ML IJ SOLN
4.0000 mg | Freq: Once | INTRAMUSCULAR | Status: AC
  Administered 2011-09-17: 4 mg via INTRAVENOUS
  Filled 2011-09-17: qty 2

## 2011-09-17 MED ORDER — HYDROMORPHONE HCL PF 1 MG/ML IJ SOLN
1.0000 mg | Freq: Once | INTRAMUSCULAR | Status: AC
  Administered 2011-09-17: 1 mg via INTRAVENOUS
  Filled 2011-09-17: qty 1

## 2011-09-17 MED ORDER — PENICILLIN V POTASSIUM 500 MG PO TABS: 500.0000 mg | ORAL_TABLET | Freq: Four times a day (QID) | ORAL | Status: AC

## 2011-09-17 MED ORDER — HYDROCODONE-ACETAMINOPHEN 5-325 MG PO TABS: 1.0000 | ORAL_TABLET | Freq: Four times a day (QID) | ORAL | Status: AC | PRN

## 2011-09-17 MED ORDER — NAPROXEN 500 MG PO TABS: 500.0000 mg | ORAL_TABLET | Freq: Two times a day (BID) | ORAL | Status: DC

## 2011-09-17 MED ORDER — PROMETHAZINE HCL 25 MG PO TABS: 25.0000 mg | ORAL_TABLET | Freq: Four times a day (QID) | ORAL | Status: DC | PRN

## 2011-09-17 NOTE — ED Notes (Signed)
Patient with c/o generalized body aches, fever. C/o feeling weak. +cough/nasal congestion
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
## 2011-10-24 ENCOUNTER — Other Ambulatory Visit: Payer: Self-pay | Admitting: Obstetrics & Gynecology

## 2011-10-24 ENCOUNTER — Other Ambulatory Visit (HOSPITAL_COMMUNITY)
Admission: RE | Admit: 2011-10-24 | Discharge: 2011-10-24 | Disposition: A | Discharge status: Home / Self Care | Payer: Medicaid Other | Source: Ambulatory Visit | Attending: Obstetrics & Gynecology | Admitting: Obstetrics & Gynecology

## 2011-10-24 DIAGNOSIS — IMO0002 Reserved for concepts with insufficient information to code with codable children: Secondary | ICD-10-CM

## 2011-10-24 DIAGNOSIS — Z01419 Encounter for gynecological examination (general) (routine) without abnormal findings: Secondary | ICD-10-CM | POA: Insufficient documentation

## 2011-10-24 HISTORY — DX: Reserved for concepts with insufficient information to code with codable children: IMO0002

## 2011-12-30 ENCOUNTER — Emergency Department (HOSPITAL_COMMUNITY)
Admission: EM | Admit: 2011-12-30 | Discharge: 2011-12-30 | Disposition: A | Discharge status: Home / Self Care | Payer: Medicaid Other | Attending: Emergency Medicine | Admitting: Emergency Medicine

## 2011-12-30 ENCOUNTER — Emergency Department (HOSPITAL_COMMUNITY): Payer: Medicaid Other

## 2011-12-30 ENCOUNTER — Encounter (HOSPITAL_COMMUNITY): Payer: Self-pay | Admitting: *Deleted

## 2011-12-30 DIAGNOSIS — M2550 Pain in unspecified joint: Secondary | ICD-10-CM | POA: Insufficient documentation

## 2011-12-30 DIAGNOSIS — IMO0002 Reserved for concepts with insufficient information to code with codable children: Secondary | ICD-10-CM | POA: Insufficient documentation

## 2011-12-30 DIAGNOSIS — S92909A Unspecified fracture of unspecified foot, initial encounter for closed fracture: Secondary | ICD-10-CM

## 2011-12-30 DIAGNOSIS — S92309A Fracture of unspecified metatarsal bone(s), unspecified foot, initial encounter for closed fracture: Secondary | ICD-10-CM | POA: Insufficient documentation

## 2011-12-30 DIAGNOSIS — Y9289 Other specified places as the place of occurrence of the external cause: Secondary | ICD-10-CM | POA: Insufficient documentation

## 2011-12-30 DIAGNOSIS — F17200 Nicotine dependence, unspecified, uncomplicated: Secondary | ICD-10-CM | POA: Insufficient documentation

## 2011-12-30 DIAGNOSIS — Y9389 Activity, other specified: Secondary | ICD-10-CM | POA: Insufficient documentation

## 2011-12-30 MED ORDER — HYDROCODONE-ACETAMINOPHEN 5-325 MG PO TABS
2.0000 | ORAL_TABLET | Freq: Once | ORAL | Status: AC
  Administered 2011-12-30: 1 via ORAL
  Filled 2011-12-30: qty 2

## 2011-12-30 MED ORDER — ONDANSETRON HCL 4 MG PO TABS
4.0000 mg | ORAL_TABLET | Freq: Once | ORAL | Status: AC
  Administered 2011-12-30: 4 mg via ORAL
  Filled 2011-12-30: qty 1

## 2011-12-30 MED ORDER — MELOXICAM 7.5 MG PO TABS: ORAL_TABLET | ORAL | Status: DC

## 2011-12-30 MED ORDER — HYDROCODONE-ACETAMINOPHEN 7.5-325 MG PO TABS: 1.0000 | ORAL_TABLET | ORAL | Status: DC | PRN

## 2011-12-30 MED ORDER — KETOROLAC TROMETHAMINE 10 MG PO TABS
10.0000 mg | ORAL_TABLET | Freq: Once | ORAL | Status: AC
  Administered 2011-12-30: 10 mg via ORAL
  Filled 2011-12-30: qty 1

## 2011-12-30 NOTE — ED Notes (Signed)
Pt continues to talk on cell phone while nurse in room to review d/c instructions
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
## 2011-12-30 NOTE — ED Notes (Signed)
Pt states that she stepped on porch wrong last night, pain started today and not last night, no swelling noted to top of left foot where pt points to where pain is at

## 2011-12-30 NOTE — ED Notes (Signed)
H. Bryant, PA at bedside. 

## 2011-12-30 NOTE — ED Provider Notes (Signed)
Medical screening examination/treatment/procedure(s) were performed by non-physician practitioner and as supervising physician I was immediately available for consultation/collaboration.   Shequilla Goodgame, MD 12/30/11 2319 
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
## 2012-02-21 ENCOUNTER — Emergency Department (HOSPITAL_COMMUNITY)
Admission: EM | Admit: 2012-02-21 | Discharge: 2012-02-21 | Disposition: A | Discharge status: Home / Self Care | Payer: Medicaid Other | Attending: Emergency Medicine | Admitting: Emergency Medicine

## 2012-02-21 ENCOUNTER — Encounter (HOSPITAL_COMMUNITY): Payer: Self-pay | Admitting: Emergency Medicine

## 2012-02-21 DIAGNOSIS — F17200 Nicotine dependence, unspecified, uncomplicated: Secondary | ICD-10-CM | POA: Insufficient documentation

## 2012-02-21 DIAGNOSIS — L02419 Cutaneous abscess of limb, unspecified: Secondary | ICD-10-CM | POA: Insufficient documentation

## 2012-02-21 DIAGNOSIS — L02416 Cutaneous abscess of left lower limb: Secondary | ICD-10-CM

## 2012-02-21 DIAGNOSIS — L509 Urticaria, unspecified: Secondary | ICD-10-CM | POA: Insufficient documentation

## 2012-02-21 MED ORDER — SULFAMETHOXAZOLE-TRIMETHOPRIM 800-160 MG PO TABS: 1.0000 | ORAL_TABLET | Freq: Two times a day (BID) | ORAL | Status: DC

## 2012-02-21 MED ORDER — SULFAMETHOXAZOLE-TMP DS 800-160 MG PO TABS
1.0000 | ORAL_TABLET | Freq: Once | ORAL | Status: AC
  Administered 2012-02-21: 1 via ORAL
  Filled 2012-02-21: qty 1

## 2012-02-21 MED ORDER — DIPHENHYDRAMINE HCL 25 MG PO CAPS
50.0000 mg | ORAL_CAPSULE | Freq: Once | ORAL | Status: AC
  Administered 2012-02-21: 50 mg via ORAL
  Filled 2012-02-21: qty 2

## 2012-02-21 MED ORDER — PREDNISONE 20 MG PO TABS: 60.0000 mg | ORAL_TABLET | Freq: Every day | ORAL | Status: DC

## 2012-02-21 MED ORDER — FAMOTIDINE 20 MG PO TABS
20.0000 mg | ORAL_TABLET | Freq: Once | ORAL | Status: AC
  Administered 2012-02-21: 20 mg via ORAL
  Filled 2012-02-21: qty 1

## 2012-02-21 MED ORDER — PREDNISONE 50 MG PO TABS
60.0000 mg | ORAL_TABLET | Freq: Once | ORAL | Status: AC
  Administered 2012-02-21: 60 mg via ORAL
  Filled 2012-02-21: qty 1

## 2012-02-21 MED ORDER — DIPHENHYDRAMINE HCL 25 MG PO TABS: 50.0000 mg | ORAL_TABLET | Freq: Four times a day (QID) | ORAL | Status: DC

## 2012-02-21 MED ORDER — HYDROCODONE-ACETAMINOPHEN 5-325 MG PO TABS: 1.0000 | ORAL_TABLET | ORAL | Status: AC | PRN

## 2012-02-21 MED ORDER — FAMOTIDINE 40 MG PO TABS: 40.0000 mg | ORAL_TABLET | Freq: Two times a day (BID) | ORAL | Status: DC

## 2012-02-21 NOTE — ED Notes (Signed)
Pt abscess on left calf dressed with telfa and gauze wrap. Pt tolerated well. nad noted. No active bleeding noted to site.

## 2012-02-21 NOTE — Discharge Instructions (Signed)
Abscess An abscess is an infected area that contains a collection of pus and debris.It can occur in almost any part of the body. An abscess is also known as a furuncle or boil. CAUSES  An abscess occurs when tissue gets infected. This can occur from blockage of oil or sweat glands, infection of hair follicles, or a minor injury to the skin. As the body tries to fight the infection, pus collects in the area and creates pressure under the skin. This pressure causes pain. People with weakened immune systems have difficulty fighting infections and get certain abscesses more often.  SYMPTOMS Usually an abscess develops on the skin and becomes a painful mass that is red, warm, and tender. If the abscess forms under the skin, you may feel a moveable soft area under the skin. Some abscesses break open (rupture) on their own, but most will continue to get worse without care. The infection can spread deeper into the body and eventually into the bloodstream, causing you to feel ill.  DIAGNOSIS  Your caregiver will take your medical history and perform a physical exam. A sample of fluid may also be taken from the abscess to determine what is causing your infection. TREATMENT  Your caregiver may prescribe antibiotic medicines to fight the infection. However, taking antibiotics alone usually does not cure an abscess. Your caregiver may need to make a small cut (incision) in the abscess to drain the pus. In some cases, gauze is packed into the abscess to reduce pain and to continue draining the area. HOME CARE INSTRUCTIONS   Only take over-the-counter or prescription medicines for pain, discomfort, or fever as directed by your caregiver.  If you were prescribed antibiotics, take them as directed. Finish them even if you start to feel better.  If gauze is used, follow your caregiver's directions for changing the gauze.  To avoid spreading the infection:  Keep your draining abscess covered with a  bandage.  Wash your hands well.  Do not share personal care items, towels, or whirlpools with others.  Avoid skin contact with others.  Keep your skin and clothes clean around the abscess.  Keep all follow-up appointments as directed by your caregiver. SEEK MEDICAL CARE IF:   You have increased pain, swelling, redness, fluid drainage, or bleeding.  You have muscle aches, chills, or a general ill feeling.  You have a fever. MAKE SURE YOU:   Understand these instructions.  Will watch your condition.  Will get help right away if you are not doing well or get worse. Document Released: 10/19/2004 Document Revised: 07/11/2011 Document Reviewed: 03/24/2011 [facility] Patient Information 2013 [place], [place]. Hives Hives are itchy, red, puffy (swollen) areas of the skin. Hives can change in size and location on your body. Hives can come and go for hours, days, or weeks. Hives do not spread from person to person (noncontagious). Scratching, exercise, and stress can make your hives worse. HOME CARE  Avoid things that cause your hives (triggers).  Take antihistamine medicines as told by your doctor. Do not drive while taking an antihistamine.  Take any other medicines for itching as told by your doctor.  Wear loose-fitting clothing.  Keep all doctor visits as told. GET HELP RIGHT AWAY IF:   You have a fever.  Your tongue or lips are puffy.  You have trouble breathing or swallowing.  You feel tightness in the throat or chest.  You have belly (abdominal) pain.  You have lasting or severe itching that is not helped  by medicine.  You have painful or puffy joints. These problems may be the first sign of a life-threatening allergic reaction. Call your local emergency services (911 in [place] MAKE SURE YOU:   Understand these instructions.  Will watch your condition.  Will get help right away if you are not doing well or get worse. Document Released: 10/19/2007 Document  Revised: 07/11/2011 Document Reviewed: 04/04/2011 [facility] Patient Information 2013 [place], [place].   Take one more dose of Benadryl and Pepcid this evening.  Take your next dose of Bactrim and your next dose of prednisone tomorrow morning.  Stop using hydrogen peroxide on your abscess wound, instead use mild soap and water wash twice daily and Pat dry and keep this wound covered.  You should have his wound rechecked in one week, return here for this recheck if you are unable to be seen by another provider.  Also as discussed return immediately for any signs of worsening hives including shortness of breath, throat, or lip swelling.

## 2012-02-21 NOTE — ED Provider Notes (Signed)
History     CSN: [id number]  Arrival date & time 02/21/12  1501   First MD Initiated Contact with Patient 02/21/12 1527      Chief Complaint  Patient presents with  . Rash    (Consider location/radiation/quality/duration/timing/severity/associated sxs/prior treatment) HPI Comments: [name] is a 26 y.o. Female presenting with a 24-hour history of generalized pruritic rash.  She is concerned about possible reaction to strawberries as she has been eating a lot of strawberries over the past several days, that has eaten strawberries on other occasions as frequently as one week ago without similar response.  She describes raised "welts" which are itchy and come and go in the areas locations.  She denies shortness of breath, wheezing or swelling of her lips or,.  However she does have a burning sensation on the inside of her lower lip. She has taken Benadryl with temporary relief of itching, last dose taken last night.  Additionally, concerned with an abscess on her left lower leg which spontaneously drained a week ago but continues to be open, oozing and tender.  She is using hydrogen peroxide to keep the area clean.  She denies fevers and chills and has had no red streaking, swelling or radiation of pain and has used no other medications for this infection.     The history is provided by the patient.    History reviewed. No pertinent past medical history.  Past Surgical History  Procedure Date  . Mouth surgery     History reviewed. No pertinent family history.  History  Substance Use Topics  . Smoking status: Current Every Day Smoker -- 0.5 packs/day    Types: Cigarettes  . Smokeless tobacco: Not on file  . Alcohol Use: Yes     Comment: occ    OB History    Grav Para Term Preterm Abortions TAB SAB Ect Mult Living                  Review of Systems  Constitutional: Negative for fever.  HENT: Negative for congestion, sore throat, trouble swallowing, neck pain and  voice change.   Eyes: Negative.   Respiratory: Negative for chest tightness, shortness of breath, wheezing and stridor.   Cardiovascular: Negative for chest pain.  Gastrointestinal: Negative for nausea and abdominal pain.  Genitourinary: Negative.   Musculoskeletal: Negative for joint swelling and arthralgias.  Skin: Positive for rash and wound.  Neurological: Negative for dizziness, weakness, light-headedness, numbness and headaches.  Hematological: Negative.   Psychiatric/Behavioral: Negative.     Allergies  Latex  Home Medications   Current Outpatient Rx  Name  Route  Sig  Dispense  Refill  . DIPHENHYDRAMINE HCL 25 MG PO TABS   Oral   Take 25 mg by mouth once as needed. *Taken at bedtime once as needed for rash/sleep*           BP 130/84  Pulse 94  Temp 98 F (36.7 C) (Oral)  Resp 20  Ht 5\' 2"  (1.575 m)  Wt 161 lb 2 oz (73.086 kg)  BMI 29.47 kg/m2  SpO2 100%  LMP 02/14/2012  Physical Exam  Constitutional: She appears well-developed and well-nourished.  Non-toxic appearance. No distress.  HENT:  Head: Normocephalic.  Neck: Neck supple.  Cardiovascular: Normal rate.   Pulmonary/Chest: Effort normal. She has no wheezes.  Musculoskeletal: Normal range of motion. She exhibits no edema.  Skin: Rash noted. Rash is urticarial.        2 cm ulceration left  lateral lower leg with granulation tissue present .  Tender to palpation surrounding this ulceration, there is no surrounding erythema, fluctuance, but there is mild induration about 0.5 cm immediately surrounding the ulcer site.      ED Course  Procedures (including critical care time)  Labs Reviewed - No data to display No results found.   1. Hives   2. Abscess of leg, left     3:53 PM Patient was medicated with prednisone 60 mg, Benadryl 50 mg and Pepcid 20 mg by mouth.  She was also given Bactrim as her first dose of antibiotic for her healing abscess site.  MDM  Patient with urticaria, possibly  allergic reaction to strawberries as patient can identify no other new exposures either food or chemical.  No wheezing, stridor facial or mouth edema concerning for angioedema.  Patient placed on Benadryl Pepcid and prednisone for the next 3 days.  She was also prescribed Bactrim for the abscess and encouraged mild soap and water wash and 2 DC using peroxide.  Patient to get rechecked in one week regarding the abscess, return here immediately for any worsening rash including facial mouth tongue or lip swelling or shortness of breath which patient understands to return immediately 4.        Burgess Amor, [place] 02/21/12 1555

## 2012-02-21 NOTE — ED Notes (Signed)
Pt c/o rash since yesterday. All over. Pt has redness/welts generalized over body. Pt c/o pain/itching. Denies new foods, meds, soaps

## 2012-02-21 NOTE — ED Provider Notes (Signed)
Medical screening examination/treatment/procedure(s) were performed by non-physician practitioner and as supervising physician I was immediately available for consultation/collaboration.   Josephina Melcher L Uzziel Russey, MD 02/21/12 2037 

## 2012-03-01 ENCOUNTER — Encounter (HOSPITAL_COMMUNITY): Payer: Self-pay | Admitting: *Deleted

## 2012-03-01 ENCOUNTER — Emergency Department (HOSPITAL_COMMUNITY)
Admission: EM | Admit: 2012-03-01 | Discharge: 2012-03-01 | Disposition: A | Discharge status: Home / Self Care | Payer: Medicaid Other | Attending: Emergency Medicine | Admitting: Emergency Medicine

## 2012-03-01 DIAGNOSIS — W19XXXA Unspecified fall, initial encounter: Secondary | ICD-10-CM

## 2012-03-01 DIAGNOSIS — W108XXA Fall (on) (from) other stairs and steps, initial encounter: Secondary | ICD-10-CM | POA: Insufficient documentation

## 2012-03-01 DIAGNOSIS — S0990XA Unspecified injury of head, initial encounter: Secondary | ICD-10-CM | POA: Insufficient documentation

## 2012-03-01 DIAGNOSIS — S3981XA Other specified injuries of abdomen, initial encounter: Secondary | ICD-10-CM | POA: Insufficient documentation

## 2012-03-01 DIAGNOSIS — S5010XA Contusion of unspecified forearm, initial encounter: Secondary | ICD-10-CM | POA: Insufficient documentation

## 2012-03-01 DIAGNOSIS — T07XXXA Unspecified multiple injuries, initial encounter: Secondary | ICD-10-CM

## 2012-03-01 DIAGNOSIS — Y939 Activity, unspecified: Secondary | ICD-10-CM | POA: Insufficient documentation

## 2012-03-01 DIAGNOSIS — Y929 Unspecified place or not applicable: Secondary | ICD-10-CM | POA: Insufficient documentation

## 2012-03-01 DIAGNOSIS — IMO0002 Reserved for concepts with insufficient information to code with codable children: Secondary | ICD-10-CM | POA: Insufficient documentation

## 2012-03-01 DIAGNOSIS — F17200 Nicotine dependence, unspecified, uncomplicated: Secondary | ICD-10-CM | POA: Insufficient documentation

## 2012-03-01 MED ORDER — TETANUS-DIPHTH-ACELL PERTUSSIS 5-2.5-18.5 LF-MCG/0.5 IM SUSP
0.5000 mL | Freq: Once | INTRAMUSCULAR | Status: AC
  Administered 2012-03-01: 0.5 mL via INTRAMUSCULAR
  Filled 2012-03-01: qty 0.5

## 2012-03-01 NOTE — ED Provider Notes (Signed)
History     CSN: [id number]  Arrival date & time 03/01/12  1903   First MD Initiated Contact with Patient 03/01/12 1926      Chief Complaint  Patient presents with  . Fall    (Consider location/radiation/quality/duration/timing/severity/associated sxs/prior treatment) HPI Comments: [name] is a 27 y.o. Female who was injured earlier this morning, when she tripped down some steps. She injured both knees, her left arm, and her upper abdomen. Since then, she has not taken any medicines. She took a nap, and decided to come in later. She denies head injury, neck pain, back pain, nausea, vomiting, weakness, or dizziness. She has increased pain, with movement and walking. There are no other modifying factors  Patient is a 27 y.o. female presenting with fall. The history is provided by the patient.  Fall    History reviewed. No pertinent past medical history.  Past Surgical History  Procedure Date  . Mouth surgery     History reviewed. No pertinent family history.  History  Substance Use Topics  . Smoking status: Current Every Day Smoker -- 0.5 packs/day    Types: Cigarettes  . Smokeless tobacco: Not on file  . Alcohol Use: Yes     Comment: occ    OB History    Grav Para Term Preterm Abortions TAB SAB Ect Mult Living                  Review of Systems  All other systems reviewed and are negative.    Allergies  Latex  Home Medications   Current Outpatient Rx  Name  Route  Sig  Dispense  Refill  . HYDROCODONE-ACETAMINOPHEN 5-325 MG PO TABS   Oral   Take 1 tablet by mouth every 4 (four) hours as needed for pain.   15 tablet   0   . SULFAMETHOXAZOLE-TRIMETHOPRIM 800-160 MG PO TABS   Oral   Take 1 tablet by mouth 2 (two) times daily.   20 tablet   0     BP 120/71  Pulse 96  Temp 98.2 F (36.8 C) (Oral)  Resp 20  Ht 5\' 4"  (1.626 m)  Wt 161 lb 9 oz (73.284 kg)  BMI 27.73 kg/m2  SpO2 100%  LMP 02/14/2012  Physical Exam  Nursing note and  vitals reviewed. Constitutional: She is oriented to person, place, and time. She appears well-developed and well-nourished.  HENT:  Head: Normocephalic and atraumatic.  Eyes: Conjunctivae normal and EOM are normal. Pupils are equal, round, and reactive to light.  Neck: Normal range of motion and phonation normal. Neck supple.  Cardiovascular: Normal rate, regular rhythm and intact distal pulses.   Pulmonary/Chest: Effort normal and breath sounds normal. She exhibits no tenderness.       No crepitation chest wall  Abdominal: Soft. She exhibits no distension. There is no tenderness. There is no guarding.       No bruising of the abdominal wall  Musculoskeletal: She exhibits no edema.       Bruising both anterior knees, left upper anterior shin, left medial forearm. There are mild areas of swelling, with the bruising. She has somewhat diminished range of motion in the left knee, but normal range of motion left elbow, and right knee. There are no large joint deformities. No spine tenderness.  Neurological: She is alert and oriented to person, place, and time. She has normal strength. She exhibits normal muscle tone.  Skin: Skin is warm and dry.  Superficial abrasions, nonbleeding, both anterior knees. There are no skin lacerations.  Psychiatric: She has a normal mood and affect. Her behavior is normal. Judgment and thought content normal.    ED Course  Procedures (including critical care time)  Tdap given.      1. Fall   2. Contusion, multiple sites       MDM  Contusions and abrasions, secondary to accidental fall. Patient stable for discharge. Imaging is not indicated.      Plan: Home Medications- Advil; Home Treatments- ICE, wound care; Recommended follow up- PCP of choice prn  Flint Melter, MD 03/01/12 2023

## 2012-03-01 NOTE — ED Notes (Signed)
Fell down 3 steps, contusion to lt arm , abrasions to both knees, abd pain,, says she struck her head also.  No LOC No neck pain

## 2012-03-01 NOTE — ED Notes (Signed)
In with Dr Effie Shy for exam.

## 2012-05-16 ENCOUNTER — Telehealth: Payer: Self-pay | Admitting: Adult Health

## 2012-05-16 NOTE — Telephone Encounter (Signed)
Called pt. And informed her the pap results from 10/24/11, her pap came back LSIL. She was sent 2 letters with out response. An appointment was made for May 28, 2012 with Dr. Despina Hidden for colposcopy.

## 2012-05-27 ENCOUNTER — Encounter: Payer: Self-pay | Admitting: *Deleted

## 2012-05-27 DIAGNOSIS — IMO0002 Reserved for concepts with insufficient information to code with codable children: Secondary | ICD-10-CM

## 2012-05-27 DIAGNOSIS — Z8619 Personal history of other infectious and parasitic diseases: Secondary | ICD-10-CM | POA: Insufficient documentation

## 2012-05-27 DIAGNOSIS — Z8709 Personal history of other diseases of the respiratory system: Secondary | ICD-10-CM

## 2012-05-28 ENCOUNTER — Encounter: Payer: Self-pay | Admitting: Obstetrics & Gynecology

## 2012-06-07 ENCOUNTER — Encounter: Payer: Self-pay | Admitting: Obstetrics & Gynecology

## 2012-06-27 ENCOUNTER — Ambulatory Visit (INDEPENDENT_AMBULATORY_CARE_PROVIDER_SITE_OTHER): Payer: Medicaid Other | Admitting: Obstetrics & Gynecology

## 2012-06-27 ENCOUNTER — Encounter: Payer: Self-pay | Admitting: Obstetrics & Gynecology

## 2012-06-27 VITALS — BP 120/80 | Wt 170.0 lb

## 2012-06-27 DIAGNOSIS — N870 Mild cervical dysplasia: Secondary | ICD-10-CM

## 2012-06-27 NOTE — Progress Notes (Signed)
Patient ID: [name], female   DOB: April 28, 1985, 27 y.o.   MRN: [id number] Pap LSIL  Colposcopy HPV atypia No mosaicism No punctation  Recommend Pap and HPV in November

## 2012-07-11 ENCOUNTER — Encounter (HOSPITAL_COMMUNITY): Payer: Self-pay

## 2012-07-11 ENCOUNTER — Emergency Department (HOSPITAL_COMMUNITY)
Admission: EM | Admit: 2012-07-11 | Discharge: 2012-07-11 | Disposition: A | Discharge status: Home / Self Care | Payer: Medicaid Other | Attending: Emergency Medicine | Admitting: Emergency Medicine

## 2012-07-11 DIAGNOSIS — L0231 Cutaneous abscess of buttock: Secondary | ICD-10-CM | POA: Insufficient documentation

## 2012-07-11 DIAGNOSIS — Z8709 Personal history of other diseases of the respiratory system: Secondary | ICD-10-CM | POA: Insufficient documentation

## 2012-07-11 DIAGNOSIS — Z91040 Latex allergy status: Secondary | ICD-10-CM | POA: Insufficient documentation

## 2012-07-11 DIAGNOSIS — F17200 Nicotine dependence, unspecified, uncomplicated: Secondary | ICD-10-CM | POA: Insufficient documentation

## 2012-07-11 DIAGNOSIS — Z8619 Personal history of other infectious and parasitic diseases: Secondary | ICD-10-CM | POA: Insufficient documentation

## 2012-07-11 MED ORDER — LIDOCAINE HCL (PF) 2 % IJ SOLN
10.0000 mL | Freq: Once | INTRAMUSCULAR | Status: DC
  Filled 2012-07-11: qty 10

## 2012-07-11 MED ORDER — HYDROCODONE-ACETAMINOPHEN 5-325 MG PO TABS: 1.0000 | ORAL_TABLET | ORAL | Status: DC | PRN

## 2012-07-11 MED ORDER — SULFAMETHOXAZOLE-TRIMETHOPRIM 800-160 MG PO TABS: 1.0000 | ORAL_TABLET | Freq: Two times a day (BID) | ORAL | Status: AC

## 2012-07-11 NOTE — ED Provider Notes (Signed)
History     CSN: [id number]  Arrival date & time 07/11/12  1512   First MD Initiated Contact with Patient 07/11/12 1519      No chief complaint on file.   (Consider location/radiation/quality/duration/timing/severity/associated sxs/prior treatment) HPI Comments: [name] is a 27 y.o. Female presenting with abscess to her right buttock which has been present for the past several days.  She has pain with palpation and with sitting.  There has been no drainage from the site and she denies nausea, vomiting,  Fevers or chills.  She has taken ibuprofen without relief of pain.  She has used warm compresses without relief and has found no alleviators.     The history is provided by the patient.    Past Medical History  Diagnosis Date  . History of chlamydia   . History of bronchitis   . Abnormal pap 10/2011  . History of trichomoniasis     Past Surgical History  Procedure Laterality Date  . Mouth surgery      Family History  Problem Relation Age of Onset  . Cancer Maternal Aunt     breast  . Hypertension Other   . Diabetes Other     History  Substance Use Topics  . Smoking status: Current Every Day Smoker -- 0.50 packs/day    Types: Cigarettes  . Smokeless tobacco: Not on file  . Alcohol Use: Yes     Comment: occ    OB History   Grav Para Term Preterm Abortions TAB SAB Ect Mult Living   1 1 1              Review of Systems  Constitutional: Negative for fever and chills.  HENT: Negative for facial swelling.   Respiratory: Negative for shortness of breath and wheezing.   Skin: Positive for color change and wound.  Neurological: Negative for numbness.    Allergies  Latex  Home Medications   Current Outpatient Rx  Name  Route  Sig  Dispense  Refill  . ibuprofen (ADVIL,MOTRIN) 200 MG tablet   Oral   Take 200 mg by mouth every 6 (six) hours as needed for pain.         Marland Kitchen HYDROcodone-acetaminophen (NORCO/VICODIN) 5-325 MG per tablet   Oral  Take 1 tablet by mouth every 4 (four) hours as needed for pain.   15 tablet   0   . sulfamethoxazole-trimethoprim (BACTRIM DS,SEPTRA DS) 800-160 MG per tablet   Oral   Take 1 tablet by mouth 2 (two) times daily.   20 tablet   0     BP 118/72  Pulse 105  Temp(Src) 98 F (36.7 C) (Oral)  Resp 20  Ht 5\' 2"  (1.575 m)  Wt 164 lb 1 oz (74.418 kg)  BMI 30 kg/m2  SpO2 99%  LMP 12/03/2011  Physical Exam  Constitutional: She appears well-developed and well-nourished. No distress.  HENT:  Head: Normocephalic.  Neck: Neck supple.  Cardiovascular: Normal rate.   Pulmonary/Chest: Effort normal. She has no wheezes.  Musculoskeletal: Normal range of motion. She exhibits no edema.  Skin: There is erythema.  2 cm induration right medial buttock with central yellow eschar,  No drainage,  No fluctuance.      ED Course  Procedures (including critical care time)  INCISION AND DRAINAGE Performed by: Burgess Amor Consent: Verbal consent obtained. Risks and benefits: risks, benefits and alternatives were discussed Type: abscess  Body area: right buttock  Anesthesia: local infiltration  Incision was made  with a scalpel.  Local anesthetic: lidocaine 2% without epinephrine  Anesthetic total: 2 ml  Complexity: complex Unable to blunt dissect - pt did not tolerate procedure well.    Drainage: purulent  Drainage amount: moderate  Packing material: none Patient tolerance: Patient did not tolerate the numbing or the attempts to open the abscess, she kept moving off the bed.  After the second lidocaine injection,  There was spontaneous drainage from the central punctum.  Pt deferred any further opening of the site.  Gentle pressure applied with moderate drainage from the abscess site.   Labs Reviewed - No data to display No results found.   1. Abscess of buttock, right       MDM  Bactrim,  Hydrocodone prescribed. Pt to start warm soaks tonight.  Encouraged recheck if not  improving over the next few days.  Pt understands abscess is not opened adequately, but warm soaks and abx may resolve this.  She understands and will return if needed.        Burgess Amor, PA-C 07/11/12 1757

## 2012-07-11 NOTE — ED Notes (Signed)
Left in c/o family for transport home; a&ox4; in no distress.  Instructions reviewed and f/u information provided - verbalizes understanding.

## 2012-07-11 NOTE — ED Notes (Signed)
Pt reports abscess to her upper rt thigh areas since yesterday.  Pt denies any drainage from the area but does report pain, swelling, and redness.

## 2012-07-11 NOTE — ED Notes (Signed)
Urine pregnancy negative  

## 2012-07-12 NOTE — ED Provider Notes (Signed)
Medical screening examination/treatment/procedure(s) were performed by non-physician practitioner and as supervising physician I was immediately available for consultation/collaboration.    Ahni Bradwell D Andreyah Natividad, MD 07/12/12 0953 

## 2012-10-25 ENCOUNTER — Telehealth: Payer: Self-pay | Admitting: Adult Health

## 2012-10-28 NOTE — Telephone Encounter (Signed)
No answer

## 2012-10-30 ENCOUNTER — Telehealth: Payer: Self-pay | Admitting: *Deleted

## 2012-10-30 ENCOUNTER — Other Ambulatory Visit: Payer: Self-pay | Admitting: Obstetrics & Gynecology

## 2012-10-30 ENCOUNTER — Ambulatory Visit (INDEPENDENT_AMBULATORY_CARE_PROVIDER_SITE_OTHER): Payer: Medicaid Other | Admitting: Adult Health

## 2012-10-30 VITALS — BP 114/64 | Wt 166.0 lb

## 2012-10-30 DIAGNOSIS — Z3202 Encounter for pregnancy test, result negative: Secondary | ICD-10-CM

## 2012-10-30 DIAGNOSIS — O3680X0 Pregnancy with inconclusive fetal viability, not applicable or unspecified: Secondary | ICD-10-CM

## 2012-10-30 DIAGNOSIS — Z3201 Encounter for pregnancy test, result positive: Secondary | ICD-10-CM

## 2012-10-30 MED ORDER — PROMETHAZINE HCL 25 MG PO TABS: 25.0000 mg | ORAL_TABLET | Freq: Four times a day (QID) | ORAL | Status: DC | PRN

## 2012-10-30 NOTE — Telephone Encounter (Signed)
Pt positive pregnancy test today, requesting meds for nausea and vomiting.

## 2012-10-30 NOTE — Telephone Encounter (Signed)
Left message I called phenergan in to [facility] [employer]

## 2012-10-30 NOTE — Progress Notes (Signed)
Patient ID: [name], female   DOB: 01/10/1986, 27 y.o.   MRN: [id number] Positive pregnancy test today, pt requesting meds for nausea and vomiting. Appointment made for tomorrow for New OB ultrasound.

## 2012-10-31 ENCOUNTER — Other Ambulatory Visit: Payer: Medicaid Other

## 2012-11-01 ENCOUNTER — Encounter (INDEPENDENT_AMBULATORY_CARE_PROVIDER_SITE_OTHER): Payer: Self-pay

## 2012-11-01 ENCOUNTER — Ambulatory Visit (INDEPENDENT_AMBULATORY_CARE_PROVIDER_SITE_OTHER): Payer: Medicaid Other

## 2012-11-01 DIAGNOSIS — O3680X0 Pregnancy with inconclusive fetal viability, not applicable or unspecified: Secondary | ICD-10-CM

## 2012-11-01 NOTE — Progress Notes (Signed)
U/S(10+0wks)-single IUP with +FCA noted, CRL c/w LMP dates, cx long and closed, Rt ovarian cyst noted-3.9cm, simple, Lt ovary appears wnl, no free fluid noted, FHR-164 bpm

## 2012-11-03 ENCOUNTER — Encounter (HOSPITAL_COMMUNITY): Payer: Self-pay | Admitting: Emergency Medicine

## 2012-11-03 ENCOUNTER — Emergency Department (HOSPITAL_COMMUNITY)
Admission: EM | Admit: 2012-11-03 | Discharge: 2012-11-03 | Disposition: A | Discharge status: Home / Self Care | Payer: Medicaid Other | Attending: Emergency Medicine | Admitting: Emergency Medicine

## 2012-11-03 DIAGNOSIS — M545 Low back pain, unspecified: Secondary | ICD-10-CM | POA: Insufficient documentation

## 2012-11-03 DIAGNOSIS — Z8709 Personal history of other diseases of the respiratory system: Secondary | ICD-10-CM | POA: Insufficient documentation

## 2012-11-03 DIAGNOSIS — Z8619 Personal history of other infectious and parasitic diseases: Secondary | ICD-10-CM | POA: Insufficient documentation

## 2012-11-03 DIAGNOSIS — R109 Unspecified abdominal pain: Secondary | ICD-10-CM

## 2012-11-03 DIAGNOSIS — Z91040 Latex allergy status: Secondary | ICD-10-CM | POA: Insufficient documentation

## 2012-11-03 DIAGNOSIS — R110 Nausea: Secondary | ICD-10-CM | POA: Insufficient documentation

## 2012-11-03 DIAGNOSIS — F17200 Nicotine dependence, unspecified, uncomplicated: Secondary | ICD-10-CM | POA: Insufficient documentation

## 2012-11-03 HISTORY — DX: Reserved for inherently not codable concepts without codable children: IMO0001

## 2012-11-03 HISTORY — DX: Complete or unspecified spontaneous abortion without complication: O03.9

## 2012-11-03 LAB — CBC
HCT: 33.4 % — ABNORMAL LOW (ref 36.0–46.0)
Hemoglobin: 11.4 g/dL — ABNORMAL LOW (ref 12.0–15.0)
MCV: 88.8 fL (ref 78.0–100.0)
RBC: 3.76 MIL/uL — ABNORMAL LOW (ref 3.87–5.11)
WBC: 9.7 10*3/uL (ref 4.0–10.5)

## 2012-11-03 LAB — URINALYSIS, ROUTINE W REFLEX MICROSCOPIC
Bilirubin Urine: NEGATIVE
Hgb urine dipstick: NEGATIVE
Nitrite: NEGATIVE
Specific Gravity, Urine: >1.03 — ABNORMAL HIGH (ref 1.005–1.030)
pH: 6.5 (ref 5.0–8.0)

## 2012-11-03 MED ORDER — OXYCODONE-ACETAMINOPHEN 5-325 MG PO TABS: 1.0000 | ORAL_TABLET | ORAL | Status: DC | PRN

## 2012-11-03 MED ORDER — ONDANSETRON 8 MG PO TBDP
8.0000 mg | ORAL_TABLET | Freq: Once | ORAL | Status: AC
  Administered 2012-11-03: 8 mg via ORAL
  Filled 2012-11-03: qty 1

## 2012-11-03 MED ORDER — OXYCODONE-ACETAMINOPHEN 5-325 MG PO TABS
1.0000 | ORAL_TABLET | Freq: Once | ORAL | Status: AC
  Administered 2012-11-03: 1 via ORAL
  Filled 2012-11-03: qty 1

## 2012-11-03 NOTE — ED Notes (Signed)
Pt reporting lower back and abdominal pain.  Denies bleeding at this time.

## 2012-11-03 NOTE — ED Notes (Signed)
Pt had an abortion on Friday. Pt c/o lower back pain and lower abdominal pain. Pt denies any urinary symptoms, vaginal odor, n/v or vaginal bleeding. Pt was given an antibiotic but says she wasn't given any pain meds.

## 2012-11-03 NOTE — Discharge Instructions (Signed)
Abdominal Pain °Many things can cause belly (abdominal) pain. Most times, the belly pain is not dangerous. The amount of belly pain does not tell how serious the problem may be. Many cases of belly pain can be watched and treated at home. °HOME CARE  °· Do not take medicines that help you go poop (laxatives) unless told to by your doctor. °· Only take medicine as told by your doctor. °· Eat or drink as told by your doctor. Your doctor will tell you if you should be on a special diet. °GET HELP RIGHT AWAY IF:  °· The pain does not go away. °· You have a fever. °· You keep throwing up (vomiting). °· The pain changes and is only in the right or left part of the belly. °· You have bloody or tarry looking poop. °MAKE SURE YOU:  °· Understand these instructions. °· Will watch your condition. °· Will get help right away if you are not doing well or get worse. °Document Released: 06/28/2007 Document Revised: 04/03/2011 Document Reviewed: 01/25/2009 °[employer]® Patient Information ©2014 [employer]. ° °

## 2012-11-03 NOTE — ED Provider Notes (Signed)
CSN: [id number]     Arrival date & time 11/03/12  2138 History   First MD Initiated Contact with Patient 11/03/12 2146    This chart was scribed for Mckennon Zwart Rowe Robert, a non-physician practitioner working with Vida Roller, MD by Lewanda Rife, ED Scribe. This patient was seen in room APA04/APA04 and the patient's care was started at 9:50 PM     Chief Complaint  Patient presents with  . Back Pain  . Abdominal Pain   (Consider location/radiation/quality/duration/timing/severity/associated sxs/prior Treatment) The history is provided by the patient. No language interpreter was used.   HPI Comments: [name] is a 27 y.o. female who presents to the Emergency Department complaining of constant lower abdominal pain and low back onset since D& C procedure 2 days ago. Reports having procedure at [facility] medical with Dr. Tenny Craw. Reports she was [redacted] weeks pregnant at the time. Describes pain as cramping similar to menses. Reports associated mild nausea, which she attributes to the Cipro. Denies any aggravating or alleviating factors. Denies associated fever, emesis, decreased appetite, vaginal bleeding, vaginal spotting, chills, vaginal discharge, diarrhea, and dysuria. Reports taking Cipro and has one tablet left to take tomorrow. Reports she has a follow up appointment in 4-6 weeks with [place]. Denies PMHx of tubal pregnancies, miscarriages or previous abortions  G0 P1 A1     Past Medical History  Diagnosis Date  . History of chlamydia   . History of bronchitis   . Abnormal pap 10/2011  . History of trichomoniasis   . Abortion    Past Surgical History  Procedure Laterality Date  . Mouth surgery     Family History  Problem Relation Age of Onset  . Cancer Maternal Aunt     breast  . Hypertension Other   . Diabetes Other    History  Substance Use Topics  . Smoking status: Current Every Day Smoker -- 0.50 packs/day    Types: Cigarettes  . Smokeless  tobacco: Not on file  . Alcohol Use: Yes     Comment: occ   OB History   Grav Para Term Preterm Abortions TAB SAB Ect Mult Living   2 1 1             Review of Systems  Constitutional: Negative for fever, chills, activity change and appetite change.  Gastrointestinal: Positive for nausea and abdominal pain. Negative for vomiting and abdominal distention.  Genitourinary: Negative for dysuria, frequency, flank pain, vaginal bleeding, vaginal discharge, difficulty urinating and vaginal pain.  Musculoskeletal: Positive for back pain.  Skin: Negative for rash.  Neurological: Negative for syncope and weakness.  All other systems reviewed and are negative.  A complete 10 system review of systems was obtained and all systems are negative except as noted in the HPI and PMHx.     Allergies  Latex  Home Medications   Current Outpatient Rx  Name  Route  Sig  Dispense  Refill  . HYDROcodone-acetaminophen (NORCO/VICODIN) 5-325 MG per tablet   Oral   Take 1 tablet by mouth every 4 (four) hours as needed for pain.   15 tablet   0   . ibuprofen (ADVIL,MOTRIN) 200 MG tablet   Oral   Take 200 mg by mouth every 6 (six) hours as needed for pain.         . promethazine (PHENERGAN) 25 MG tablet   Oral   Take 1 tablet (25 mg total) by mouth every 6 (six) hours as needed for nausea.  30 tablet   1    BP 131/72  Pulse 96  Temp(Src) 98 F (36.7 C) (Oral)  Resp 24  Ht 5\' 3"  (1.6 m)  Wt 172 lb 6.4 oz (78.2 kg)  BMI 30.55 kg/m2  SpO2 100%  LMP 08/23/2012  Breastfeeding? Unknown Physical Exam  Nursing note and vitals reviewed. Constitutional: She is oriented to person, place, and time. She appears well-developed and well-nourished. No distress.  HENT:  Head: Normocephalic and atraumatic.  Mouth/Throat: Oropharynx is clear and moist.  Neck: Normal range of motion. Neck supple.  Cardiovascular: Normal rate, regular rhythm, normal heart sounds and intact distal pulses.   No murmur  heard. Pulmonary/Chest: Effort normal and breath sounds normal. No respiratory distress. She exhibits no tenderness.  Abdominal: Soft. She exhibits no distension and no mass. There is tenderness. There is no rebound and no guarding.  Diffuse mild TTP of lower abdomen. No guarding or rebound tenderness.  No CVA tenderness   Genitourinary: Uterus normal. Uterus is not enlarged and not tender. Right adnexum displays no mass and no tenderness. Left adnexum displays no mass and no tenderness. No tenderness or bleeding around the vagina. No vaginal discharge found.  No adnexal tenderness or masses on bimanual exam, no vaginal bleeding or CMT.  Uterus is non-tender   Musculoskeletal: Normal range of motion. She exhibits tenderness.       Lumbar back: She exhibits tenderness. She exhibits normal range of motion, no bony tenderness and no swelling.       Back:  Diffuse ttp of the lumbar paraspinal muscles.  No spinal tenderness on exam.  Dp pulses brisk, distal sensation intact.  No LE edema.  Lymphadenopathy:    She has no cervical adenopathy.  Neurological: She is alert and oriented to person, place, and time. No sensory deficit. She exhibits normal muscle tone. Coordination and gait normal.  Reflex Scores:      Patellar reflexes are 2+ on the right side and 2+ on the left side.      Achilles reflexes are 2+ on the right side and 2+ on the left side. Skin: Skin is warm and dry.    ED Course  Procedures (including critical care time) COORDINATION OF CARE:  Nursing notes reviewed. Vital signs reviewed. Initial pt interview and examination performed.   9:58 PM-Discussed work up plan with pt at bedside, which includes UA and CBC . Pt agrees with plan.   Treatment plan initiated: Medications  oxyCODONE-acetaminophen (PERCOCET/ROXICET) 5-325 MG per tablet 1 tablet (not administered)  ondansetron (ZOFRAN-ODT) disintegrating tablet 8 mg (not administered)     Initial diagnostic testing ordered.     Labs Review Results for orders placed during the hospital encounter of 11/03/12  CBC      Result Value Range   WBC 9.7  4.0 - 10.5 K/uL   RBC 3.76 (*) 3.87 - 5.11 MIL/uL   Hemoglobin 11.4 (*) 12.0 - 15.0 g/dL   HCT 16.1 (*) 09.6 - 04.5 %   MCV 88.8  78.0 - 100.0 fL   MCH 30.3  26.0 - 34.0 pg   MCHC 34.1  30.0 - 36.0 g/dL   RDW 40.9  81.1 - 91.4 %   Platelets 208  150 - 400 K/uL  URINALYSIS, ROUTINE W REFLEX MICROSCOPIC      Result Value Range   Color, Urine YELLOW  YELLOW   APPearance CLEAR  CLEAR   Specific Gravity, Urine >1.030 (*) 1.005 - 1.030   pH 6.5  5.0 -  8.0   Glucose, UA NEGATIVE  NEGATIVE mg/dL   Hgb urine dipstick NEGATIVE  NEGATIVE   Bilirubin Urine NEGATIVE  NEGATIVE   Ketones, ur NEGATIVE  NEGATIVE mg/dL   Protein, ur NEGATIVE  NEGATIVE mg/dL   Urobilinogen, UA 1.0  0.0 - 1.0 mg/dL   Nitrite NEGATIVE  NEGATIVE   Leukocytes, UA NEGATIVE  NEGATIVE   Imaging Review No results found.    MDM   On recheck , patient is feeling better, reading a book.  I have discussed patient hx and labs with the EDP, Dr. Fleet Contras and with the patient.  Pt agrees to 48 hr f/u with [facility]. Low clinical suspicion for retained products or possible infectious process at this time.  Pt agrees to return here if sx's worsen.  VSS.  She appears stable for discharge.    I personally performed the services described in this documentation, which was scribed in my presence. The recorded information has been reviewed and is accurate.    Margrit Minner L. Trisha Mangle, PA-C 11/03/12 2333

## 2012-11-04 NOTE — ED Provider Notes (Signed)
Medical screening examination/treatment/procedure(s) were performed by non-physician practitioner and as supervising physician I was immediately available for consultation/collaboration.    Kayla Weekes D Mykelle Cockerell, MD 11/04/12 1514 

## 2012-11-05 ENCOUNTER — Ambulatory Visit (INDEPENDENT_AMBULATORY_CARE_PROVIDER_SITE_OTHER): Payer: Medicaid Other | Admitting: Obstetrics & Gynecology

## 2012-11-05 ENCOUNTER — Encounter: Payer: Self-pay | Admitting: Obstetrics & Gynecology

## 2012-11-05 VITALS — BP 108/60 | Temp 98.0°F | Wt 166.0 lb

## 2012-11-05 DIAGNOSIS — O0380 Unspecified complication following complete or unspecified spontaneous abortion: Secondary | ICD-10-CM

## 2012-11-05 DIAGNOSIS — M545 Low back pain: Secondary | ICD-10-CM

## 2012-11-05 DIAGNOSIS — R109 Unspecified abdominal pain: Secondary | ICD-10-CM

## 2012-11-05 MED ORDER — OXYCODONE-ACETAMINOPHEN 5-325 MG PO TABS: 1.0000 | ORAL_TABLET | ORAL | Status: DC | PRN

## 2012-11-05 MED ORDER — CIPROFLOXACIN HCL 500 MG PO TABS: 500.0000 mg | ORAL_TABLET | Freq: Two times a day (BID) | ORAL | Status: DC

## 2012-11-05 MED ORDER — CYCLOBENZAPRINE HCL 10 MG PO TABS: 10.0000 mg | ORAL_TABLET | Freq: Three times a day (TID) | ORAL | Status: DC | PRN

## 2012-11-05 NOTE — Progress Notes (Addendum)
Subjective:     Patient ID: [name], female   DOB: [birth year], 27 y.o.   MRN: [id number]  HPI 27 year female presents for follow up.  Patient had an elective D&C on 10/10 (she was [redacted] weeks pregnant at the time).  Following the procedure, she was placed on cipro.  On 10/12, she developed severe lower abdominal pain and lower back pain.  She then went to the ED for further evaluation.  CBC and UA were unremarkable at that time.  She was discharged home to complete her course of cipro and was given Percocet for pain.  Today, patient reports continued lower abdominal pain and low back pain, 10/10 in severity.  She reports subjective fever and chills at home.  She also notes vaginal bleeding which began last night.  Additionally, she notes constipation. She has been treating her pain with Percocet and NSAID's.  Review of Systems Per HPI    Objective:   Physical Exam Filed Vitals:   11/05/12 1438  BP: 108/60  Temp: 98 F (36.7 C)  Exam: General: appears in mild distress secondary to pain. Abdomen: soft, nondistended. Tender to palpation in the lower abdomen below the umbilicus.  No guarding or rebound.  Back: Lumbar region very tender to palpation bilaterally.  Pelvic Exam:        External: normal female genitalia without lesions or masses        Vagina: normal without lesions or masses. Blood noted in vaginal vault.        Cervix: normal without lesions or masses.  No cervical motion tenderness.         Adnexa: normal bimanual exam without masses or fullness        Uterus: normal by palpation. Extremities: no LE edema.     Assessment/Plan     27 year old [id number] presents for ED follow up regarding abdominal and back pain that developed after undergoing D&C. - Patient has significant back pain (greater than abdominal) - No clear evidence of underlying infection. - Likely lumbar muscle spasm.  Will give additional percocet, Doxycycline, and Flexeril.

## 2012-11-06 ENCOUNTER — Emergency Department (HOSPITAL_COMMUNITY)
Admission: EM | Admit: 2012-11-06 | Discharge: 2012-11-06 | Disposition: A | Discharge status: Home / Self Care | Payer: Medicaid Other | Attending: Emergency Medicine | Admitting: Emergency Medicine

## 2012-11-06 ENCOUNTER — Emergency Department (HOSPITAL_COMMUNITY): Payer: Medicaid Other

## 2012-11-06 ENCOUNTER — Encounter (HOSPITAL_COMMUNITY): Payer: Self-pay | Admitting: Emergency Medicine

## 2012-11-06 DIAGNOSIS — N719 Inflammatory disease of uterus, unspecified: Secondary | ICD-10-CM

## 2012-11-06 DIAGNOSIS — Z8709 Personal history of other diseases of the respiratory system: Secondary | ICD-10-CM | POA: Insufficient documentation

## 2012-11-06 DIAGNOSIS — N898 Other specified noninflammatory disorders of vagina: Secondary | ICD-10-CM | POA: Insufficient documentation

## 2012-11-06 DIAGNOSIS — F17200 Nicotine dependence, unspecified, uncomplicated: Secondary | ICD-10-CM | POA: Insufficient documentation

## 2012-11-06 DIAGNOSIS — N809 Endometriosis, unspecified: Secondary | ICD-10-CM | POA: Insufficient documentation

## 2012-11-06 DIAGNOSIS — Z8619 Personal history of other infectious and parasitic diseases: Secondary | ICD-10-CM | POA: Insufficient documentation

## 2012-11-06 DIAGNOSIS — R109 Unspecified abdominal pain: Secondary | ICD-10-CM | POA: Insufficient documentation

## 2012-11-06 LAB — CBC WITH DIFFERENTIAL/PLATELET
Basophils Absolute: 0 K/uL (ref 0.0–0.1)
Basophils Relative: 0 % (ref 0–1)
Eosinophils Absolute: 0.4 K/uL (ref 0.0–0.7)
Eosinophils Relative: 6 % — ABNORMAL HIGH (ref 0–5)
HCT: 32.5 % — ABNORMAL LOW (ref 36.0–46.0)
Hemoglobin: 10.9 g/dL — ABNORMAL LOW (ref 12.0–15.0)
Lymphocytes Relative: 31 % (ref 12–46)
Lymphs Abs: 2.1 K/uL (ref 0.7–4.0)
MCH: 30.1 pg (ref 26.0–34.0)
MCHC: 33.5 g/dL (ref 30.0–36.0)
MCV: 89.8 fL (ref 78.0–100.0)
Monocytes Absolute: 0.7 K/uL (ref 0.1–1.0)
Monocytes Relative: 10 % (ref 3–12)
Neutro Abs: 3.7 K/uL (ref 1.7–7.7)
Neutrophils Relative %: 53 % (ref 43–77)
Platelets: 222 K/uL (ref 150–400)
RBC: 3.62 MIL/uL — ABNORMAL LOW (ref 3.87–5.11)
RDW: 12.9 % (ref 11.5–15.5)
WBC: 7 K/uL (ref 4.0–10.5)

## 2012-11-06 LAB — URINALYSIS, ROUTINE W REFLEX MICROSCOPIC
Bilirubin Urine: NEGATIVE
Glucose, UA: NEGATIVE mg/dL
Ketones, ur: NEGATIVE mg/dL
Nitrite: NEGATIVE
Protein, ur: NEGATIVE mg/dL
Specific Gravity, Urine: 1.01 (ref 1.005–1.030)
Urobilinogen, UA: 0.2 mg/dL (ref 0.0–1.0)
pH: 7.5 (ref 5.0–8.0)

## 2012-11-06 LAB — COMPREHENSIVE METABOLIC PANEL WITH GFR
ALT: 13 U/L (ref 0–35)
AST: 18 U/L (ref 0–37)
Albumin: 3.1 g/dL — ABNORMAL LOW (ref 3.5–5.2)
Alkaline Phosphatase: 40 U/L (ref 39–117)
BUN: 4 mg/dL — ABNORMAL LOW (ref 6–23)
CO2: 24 meq/L (ref 19–32)
Calcium: 8.9 mg/dL (ref 8.4–10.5)
Chloride: 102 meq/L (ref 96–112)
Creatinine, Ser: 0.49 mg/dL — ABNORMAL LOW (ref 0.50–1.10)
GFR calc Af Amer: >90 mL/min
GFR calc non Af Amer: >90 mL/min
Glucose, Bld: 120 mg/dL — ABNORMAL HIGH (ref 70–99)
Potassium: 3.4 meq/L — ABNORMAL LOW (ref 3.5–5.1)
Sodium: 135 meq/L (ref 135–145)
Total Bilirubin: <0.1 mg/dL — ABNORMAL LOW (ref 0.3–1.2)
Total Protein: 6.7 g/dL (ref 6.0–8.3)

## 2012-11-06 LAB — WET PREP, GENITAL
Clue Cells Wet Prep HPF POC: NONE SEEN
Trich, Wet Prep: NONE SEEN
Yeast Wet Prep HPF POC: NONE SEEN

## 2012-11-06 LAB — URINE MICROSCOPIC-ADD ON

## 2012-11-06 MED ORDER — IOHEXOL 300 MG/ML  SOLN
50.0000 mL | Freq: Once | INTRAMUSCULAR | Status: AC | PRN
  Administered 2012-11-06: 50 mL via ORAL

## 2012-11-06 MED ORDER — HYDROMORPHONE HCL PF 1 MG/ML IJ SOLN
1.0000 mg | Freq: Once | INTRAMUSCULAR | Status: AC
  Administered 2012-11-06: 1 mg via INTRAVENOUS
  Filled 2012-11-06: qty 1

## 2012-11-06 MED ORDER — HYDROMORPHONE HCL PF 1 MG/ML IJ SOLN: 1.0000 mg | Freq: Once | INTRAMUSCULAR | Status: DC

## 2012-11-06 MED ORDER — SODIUM CHLORIDE 0.9 % IV BOLUS (SEPSIS)
1000.0000 mL | Freq: Once | INTRAVENOUS | Status: AC
  Administered 2012-11-06: 1000 mL via INTRAVENOUS

## 2012-11-06 MED ORDER — SODIUM CHLORIDE 0.9 % IV SOLN
INTRAVENOUS | Status: DC
  Administered 2012-11-06: 20:00:00 via INTRAVENOUS

## 2012-11-06 MED ORDER — CIPROFLOXACIN HCL 500 MG PO TABS: 500.0000 mg | ORAL_TABLET | Freq: Two times a day (BID) | ORAL | Status: DC

## 2012-11-06 MED ORDER — ONDANSETRON HCL 4 MG/2ML IJ SOLN
4.0000 mg | Freq: Once | INTRAMUSCULAR | Status: AC
Start: 2012-11-06 — End: 2012-11-06
  Administered 2012-11-06: 4 mg via INTRAVENOUS
  Filled 2012-11-06: qty 2

## 2012-11-06 MED ORDER — METRONIDAZOLE 500 MG PO TABS: 500.0000 mg | ORAL_TABLET | Freq: Two times a day (BID) | ORAL | Status: DC

## 2012-11-06 MED ORDER — HYDROCODONE-ACETAMINOPHEN 5-325 MG PO TABS
ORAL_TABLET | ORAL | Status: AC
  Administered 2012-11-06: 1
  Filled 2012-11-06: qty 1

## 2012-11-06 MED ORDER — HYDROCODONE-ACETAMINOPHEN 5-325 MG PO TABS: 1.0000 | ORAL_TABLET | Freq: Once | ORAL | Status: DC

## 2012-11-06 MED ORDER — HYDROCODONE-ACETAMINOPHEN 5-325 MG PO TABS: 1.0000 | ORAL_TABLET | Freq: Four times a day (QID) | ORAL | Status: DC | PRN

## 2012-11-06 MED ORDER — IOHEXOL 300 MG/ML  SOLN
100.0000 mL | Freq: Once | INTRAMUSCULAR | Status: AC | PRN
  Administered 2012-11-06: 100 mL via INTRAVENOUS

## 2012-11-06 NOTE — ED Notes (Signed)
Advised patient that a urine sample is needed. Patient is asleep and snoring.

## 2012-11-06 NOTE — ED Notes (Signed)
Pt presents with persistent abdominal pain after elective abortion on 10/10. Pt reports vaginal bleeding with no clots, lower back pain and severe lower pelvic/abdominal pain.  Pt denies fever, N/V/D at this time. Denies discharge at this time. NAD noted

## 2012-11-06 NOTE — ED Notes (Addendum)
Pt reports had an abortion Friday and started having pain in lower back radiating into abdomen.  Denies any n/v/d.  Pt says went to appt with Dr. Emelda Fear yesterday but says was told her prescriptions won't be ready until Friday.

## 2012-11-06 NOTE — ED Notes (Signed)
Pt assisted to restroom. Pt ambulated well with steady gait once out of bed.  Slightly dizzy upon standing per pt.

## 2012-11-06 NOTE — ED Notes (Signed)
Patient calling for something to drink and pain medication for her back. Gave patient Sprite to drink,m states that she does not drink Sprite, Ginger ale given at this time.

## 2012-11-06 NOTE — ED Provider Notes (Signed)
CSN: [id number]     Arrival date & time 11/06/12  1730 History  This chart was scribed for Kayla Jakes, MD by Leone Payor, ED Scribe. This patient was seen in room APA09/APA09 and the patient's care was started 6:46 PM.    Chief Complaint  Patient presents with  . Back Pain    The history is provided by the patient. No language interpreter was used.    HPI Comments: [name] is a 27 y.o. female who presents to the Emergency Department complaining of constant, gradually worsening, severe lower back pain that began 3 days ago. Pt describes this pain as shooting and rates it as 10/10 currently. She also has constant, unchanged lower abdominal pain and vaginal bleeding that has been ongoing since having a D&C on 11/01/12 at [facility]. She rates her abdominal pain as 8/10 currently. Pt was seen here on 11/03/12 and had a benign pelvic exam. She was referred to Dr. Emelda Fear and has an appointment set for 11/12/12. She denies fever, nausea, vomiting.    Past Medical History  Diagnosis Date  . History of chlamydia   . History of bronchitis   . Abnormal pap 10/2011  . History of trichomoniasis   . Abortion    Past Surgical History  Procedure Laterality Date  . Mouth surgery    . Elective abortion     Family History  Problem Relation Age of Onset  . Cancer Maternal Aunt     breast  . Hypertension Other   . Diabetes Other    History  Substance Use Topics  . Smoking status: Current Every Day Smoker -- 0.50 packs/day    Types: Cigarettes  . Smokeless tobacco: Not on file  . Alcohol Use: Yes     Comment: occ   OB History   Grav Para Term Preterm Abortions TAB SAB Ect Mult Living   2 1 1             Review of Systems  HENT: Negative for congestion, rhinorrhea and sore throat.   Eyes: Negative for visual disturbance.  Respiratory: Negative for cough and shortness of breath.   Cardiovascular: Negative for leg swelling.  Gastrointestinal: Positive  for abdominal pain (lower quadrant). Negative for nausea, vomiting and diarrhea.  Genitourinary: Positive for vaginal bleeding.  Musculoskeletal: Positive for back pain.  Skin: Negative for rash.  Hematological: Does not bruise/bleed easily.  Psychiatric/Behavioral: Negative for confusion.  All other systems reviewed and are negative.    Allergies  Latex  Home Medications   Current Outpatient Rx  Name  Route  Sig  Dispense  Refill  . ciprofloxacin (CIPRO) 500 MG tablet   Oral   Take 1 tablet (500 mg total) by mouth 2 (two) times daily. Started on 11/01/12   14 tablet   0   . ciprofloxacin (CIPRO) 500 MG tablet   Oral   Take 1 tablet (500 mg total) by mouth 2 (two) times daily.   20 tablet   0   . cyclobenzaprine (FLEXERIL) 10 MG tablet   Oral   Take 1 tablet (10 mg total) by mouth 3 (three) times daily as needed for muscle spasms.   30 tablet   1   . HYDROcodone-acetaminophen (NORCO/VICODIN) 5-325 MG per tablet   Oral   Take 1-2 tablets by mouth every 6 (six) hours as needed for pain.   20 tablet   0   . ibuprofen (ADVIL,MOTRIN) 200 MG tablet   Oral  Take 200 mg by mouth every 6 (six) hours as needed for pain.         . metroNIDAZOLE (FLAGYL) 500 MG tablet   Oral   Take 1 tablet (500 mg total) by mouth 2 (two) times daily.   20 tablet   0   . oxyCODONE-acetaminophen (PERCOCET/ROXICET) 5-325 MG per tablet   Oral   Take 1 tablet by mouth every 4 (four) hours as needed for pain.   30 tablet   0   . promethazine (PHENERGAN) 25 MG tablet   Oral   Take 1 tablet (25 mg total) by mouth every 6 (six) hours as needed for nausea.   30 tablet   1    BP 89/47  Pulse 77  Temp(Src) 98.1 F (36.7 C) (Oral)  Resp 16  Ht 5\' 3"  (1.6 m)  Wt 166 lb (75.297 kg)  BMI 29.41 kg/m2  SpO2 98%  LMP 08/23/2012 Physical Exam  Nursing note and vitals reviewed. Constitutional: She is oriented to person, place, and time. She appears well-developed and well-nourished.   HENT:  Head: Normocephalic and atraumatic.  Mouth/Throat: Oropharynx is clear and moist.  Eyes: Conjunctivae and EOM are normal. Pupils are equal, round, and reactive to light.  Neck: Normal range of motion. Neck supple.  Cardiovascular: Normal rate, regular rhythm and normal heart sounds.  Exam reveals no gallop and no friction rub.   No murmur heard. Pulmonary/Chest: Effort normal and breath sounds normal. No respiratory distress. She has no wheezes. She has no rales. She exhibits no tenderness.  Abdominal: Soft. Bowel sounds are normal. She exhibits no distension. There is tenderness (tenderness in the lower quadrants).  Genitourinary: Uterus is tender. Cervix exhibits motion tenderness and discharge (mucous/bloody ). Right adnexum displays no tenderness. Left adnexum displays no tenderness.  External genitalia normal. Mucousy, bloody discharge noted. No frank pus. Speculum exam: cervix is normal, no laceration. Mild CMT. Uterine tenderness . No adnexal tenderness.   Musculoskeletal: Normal range of motion. She exhibits tenderness (back is very tender). She exhibits no edema.  Neurological: She is alert and oriented to person, place, and time.  Skin: Skin is warm and dry.  Psychiatric: She has a normal mood and affect.    ED Course  Procedures   DIAGNOSTIC STUDIES: Oxygen Saturation is 100% on RA, normal by my interpretation.    COORDINATION OF CARE: 6:49 PM Will order CT of abdomen, UAm CBC, CMP, lipase. Discussed treatment plan with pt at bedside and pt agreed to plan.   7:23 PM Pt is refusing CT of the abdomen.   9:42 PM Pt is agreeing to go through with CT exam.   Labs Review Labs Reviewed  WET PREP, GENITAL - Abnormal; Notable for the following:    WBC, Wet Prep HPF POC TOO NUMEROUS TO COUNT (*)    All other components within normal limits  URINALYSIS, ROUTINE W REFLEX MICROSCOPIC - Abnormal; Notable for the following:    Hgb urine dipstick LARGE (*)    Leukocytes, UA  SMALL (*)    All other components within normal limits  CBC WITH DIFFERENTIAL - Abnormal; Notable for the following:    RBC 3.62 (*)    Hemoglobin 10.9 (*)    HCT 32.5 (*)    Eosinophils Relative 6 (*)    All other components within normal limits  COMPREHENSIVE METABOLIC PANEL - Abnormal; Notable for the following:    Potassium 3.4 (*)    Glucose, Bld 120 (*)  BUN 4 (*)    Creatinine, Ser 0.49 (*)    Albumin 3.1 (*)    Total Bilirubin <0.1 (*)    All other components within normal limits  HCG, QUANTITATIVE, PREGNANCY - Abnormal; Notable for the following:    hCG, Beta Chain, Quant, S 1601 (*)    All other components within normal limits  URINE MICROSCOPIC-ADD ON - Abnormal; Notable for the following:    Squamous Epithelial / LPF FEW (*)    All other components within normal limits  GC/CHLAMYDIA PROBE AMP  LIPASE, BLOOD   Results for orders placed during the hospital encounter of 11/06/12  WET PREP, GENITAL      Result Value Range   Yeast Wet Prep HPF POC NONE SEEN  NONE SEEN   Trich, Wet Prep NONE SEEN  NONE SEEN   Clue Cells Wet Prep HPF POC NONE SEEN  NONE SEEN   WBC, Wet Prep HPF POC TOO NUMEROUS TO COUNT (*) NONE SEEN  URINALYSIS, ROUTINE W REFLEX MICROSCOPIC      Result Value Range   Color, Urine YELLOW  YELLOW   APPearance CLEAR  CLEAR   Specific Gravity, Urine 1.010  1.005 - 1.030   pH 7.5  5.0 - 8.0   Glucose, UA NEGATIVE  NEGATIVE mg/dL   Hgb urine dipstick LARGE (*) NEGATIVE   Bilirubin Urine NEGATIVE  NEGATIVE   Ketones, ur NEGATIVE  NEGATIVE mg/dL   Protein, ur NEGATIVE  NEGATIVE mg/dL   Urobilinogen, UA 0.2  0.0 - 1.0 mg/dL   Nitrite NEGATIVE  NEGATIVE   Leukocytes, UA SMALL (*) NEGATIVE  CBC WITH DIFFERENTIAL      Result Value Range   WBC 7.0  4.0 - 10.5 K/uL   RBC 3.62 (*) 3.87 - 5.11 MIL/uL   Hemoglobin 10.9 (*) 12.0 - 15.0 g/dL   HCT 40.9 (*) 81.1 - 91.4 %   MCV 89.8  78.0 - 100.0 fL   MCH 30.1  26.0 - 34.0 pg   MCHC 33.5  30.0 - 36.0 g/dL    RDW 78.2  95.6 - 21.3 %   Platelets 222  150 - 400 K/uL   Neutrophils Relative % 53  43 - 77 %   Neutro Abs 3.7  1.7 - 7.7 K/uL   Lymphocytes Relative 31  12 - 46 %   Lymphs Abs 2.1  0.7 - 4.0 K/uL   Monocytes Relative 10  3 - 12 %   Monocytes Absolute 0.7  0.1 - 1.0 K/uL   Eosinophils Relative 6 (*) 0 - 5 %   Eosinophils Absolute 0.4  0.0 - 0.7 K/uL   Basophils Relative 0  0 - 1 %   Basophils Absolute 0.0  0.0 - 0.1 K/uL  COMPREHENSIVE METABOLIC PANEL      Result Value Range   Sodium 135  135 - 145 mEq/L   Potassium 3.4 (*) 3.5 - 5.1 mEq/L   Chloride 102  96 - 112 mEq/L   CO2 24  19 - 32 mEq/L   Glucose, Bld 120 (*) 70 - 99 mg/dL   BUN 4 (*) 6 - 23 mg/dL   Creatinine, Ser 0.86 (*) 0.50 - 1.10 mg/dL   Calcium 8.9  8.4 - 57.8 mg/dL   Total Protein 6.7  6.0 - 8.3 g/dL   Albumin 3.1 (*) 3.5 - 5.2 g/dL   AST 18  0 - 37 U/L   ALT 13  0 - 35 U/L   Alkaline Phosphatase 40  39 - 117 U/L   Total Bilirubin <0.1 (*) 0.3 - 1.2 mg/dL   GFR calc non Af Amer >90  >90 mL/min   GFR calc Af Amer >90  >90 mL/min  LIPASE, BLOOD      Result Value Range   Lipase 20  11 - 59 U/L  HCG, QUANTITATIVE, PREGNANCY      Result Value Range   hCG, Beta Chain, Quant, S 1601 (*) <5 mIU/mL  URINE MICROSCOPIC-ADD ON      Result Value Range   Squamous Epithelial / LPF FEW (*) RARE   WBC, UA 3-6  <3 WBC/hpf   Bacteria, UA RARE  RARE    Imaging Review Ct Abdomen Pelvis W Contrast  11/06/2012   CLINICAL DATA:  Recent D&C, back pain  EXAM: CT ABDOMEN AND PELVIS WITH CONTRAST  TECHNIQUE: Multidetector CT imaging of the abdomen and pelvis was performed using the standard protocol following bolus administration of intravenous contrast. Sagittal and coronal MPR images reconstructed from axial data set.  CONTRAST:  50mL OMNIPAQUE IOHEXOL 300 MG/ML SOLN, OMNIPAQUE IOHEXOL 300 MG/ML SOLN  COMPARISON:  None  FINDINGS: Lung bases clear.  Liver, spleen, pancreas, kidneys, and adrenal glands normal appearance.   Normal appendix.  Right ovarian cyst 2.5 x 2.7 cm image 64.  Uterus appears prominent in size with prominent central low attenuation which could represent blood or retained products.  No evidence of pelvic free blood or additional abnormal fluid collection.  Stomach and bowel loops normal appearance.  No mass, adenopathy, free fluid or free air.  Tiny umbilical hernia containing fat.  No acute osseous findings.  IMPRESSION: Enlarged uterus containing central low attenuation which could represent blood or retained products.  Small right ovarian cyst.  Tiny umbilical hernia containing fat.  Otherwise negative exam.   Electronically Signed   By: Ulyses Southward M.D.   On: 11/06/2012 22:05    EKG Interpretation   None       MDM   1. Endometritis     Clinical findings on exam concerning for retained fetal products following the elective abortion and/or uterine infection in the realm of endometrioid this. We'll discuss with GYN on call for recommendations. Patient at least needs and body aches may require admission.  Discussed with on-call OB/GYN. They feel that endometritis is unlikely do to the lack of a leukocytosis. However they do recommend a course of antibiotics we'll treat with Cipro and Flagyl. Patient given referral for followup to OB/GYN.   I personally performed the services described in this documentation, which was scribed in my presence. The recorded information has been reviewed and is accurate.      Kayla Jakes, MD 11/06/12 [phone]

## 2012-11-07 LAB — GC/CHLAMYDIA PROBE AMP
CT Probe RNA: NEGATIVE
GC Probe RNA: NEGATIVE

## 2012-11-18 ENCOUNTER — Encounter: Payer: Self-pay | Admitting: Adult Health

## 2012-11-18 ENCOUNTER — Ambulatory Visit (INDEPENDENT_AMBULATORY_CARE_PROVIDER_SITE_OTHER): Payer: Medicaid Other | Admitting: Adult Health

## 2012-11-18 VITALS — BP 100/60 | Ht 62.0 in | Wt 168.0 lb

## 2012-11-18 DIAGNOSIS — R112 Nausea with vomiting, unspecified: Secondary | ICD-10-CM

## 2012-11-18 DIAGNOSIS — Z3202 Encounter for pregnancy test, result negative: Secondary | ICD-10-CM

## 2012-11-18 DIAGNOSIS — M549 Dorsalgia, unspecified: Secondary | ICD-10-CM | POA: Insufficient documentation

## 2012-11-18 DIAGNOSIS — Z3200 Encounter for pregnancy test, result unknown: Secondary | ICD-10-CM

## 2012-11-18 HISTORY — DX: Dorsalgia, unspecified: M54.9

## 2012-11-18 HISTORY — DX: Nausea with vomiting, unspecified: R11.2

## 2012-11-18 MED ORDER — PROMETHAZINE HCL 25 MG PO TABS: 25.0000 mg | ORAL_TABLET | Freq: Four times a day (QID) | ORAL | Status: DC | PRN

## 2012-11-18 NOTE — Patient Instructions (Signed)
Contraception Choices  Contraception (birth control) is the use of any methods or devices to prevent pregnancy. Below are some methods to help avoid pregnancy.  HORMONAL METHODS   · Contraceptive implant. This is a thin, plastic tube containing progesterone hormone. It does not contain estrogen hormone. Your caregiver inserts the tube in the inner part of the upper arm. The tube can remain in place for up to 3 years. After 3 years, the implant must be removed. The implant prevents the ovaries from releasing an egg (ovulation), thickens the cervical mucus which prevents sperm from entering the uterus, and thins the lining of the inside of the uterus.  · Progesterone-only injections. These injections are given every 3 months by your caregiver to prevent pregnancy. This synthetic progesterone hormone stops the ovaries from releasing eggs. It also thickens cervical mucus and changes the uterine lining. This makes it harder for sperm to survive in the uterus.  · Birth control pills. These pills contain estrogen and progesterone hormone. They work by stopping the egg from forming in the ovary (ovulation). Birth control pills are prescribed by a caregiver. Birth control pills can also be used to treat heavy periods.  · Minipill. This type of birth control pill contains only the progesterone hormone. They are taken every day of each month and must be prescribed by your caregiver.  · Birth control patch. The patch contains hormones similar to those in birth control pills. It must be changed once a week and is prescribed by a caregiver.  · Vaginal ring. The ring contains hormones similar to those in birth control pills. It is left in the vagina for 3 weeks, removed for 1 week, and then a new one is put back in place. The patient must be comfortable inserting and removing the ring from the vagina. A caregiver's prescription is necessary.  · Emergency contraception. Emergency contraceptives prevent pregnancy after unprotected  sexual intercourse. This pill can be taken right after sex or up to 5 days after unprotected sex. It is most effective the sooner you take the pills after having sexual intercourse. Emergency contraceptive pills are available without a prescription. Check with your pharmacist. Do not use emergency contraception as your only form of birth control.  BARRIER METHODS   · Female condom. This is a thin sheath (latex or rubber) that is worn over the penis during sexual intercourse. It can be used with spermicide to increase effectiveness.  · Female condom. This is a soft, loose-fitting sheath that is put into the vagina before sexual intercourse.  · Diaphragm. This is a soft, latex, dome-shaped barrier that must be fitted by a caregiver. It is inserted into the vagina, along with a spermicidal jelly. It is inserted before intercourse. The diaphragm should be left in the vagina for 6 to 8 hours after intercourse.  · Cervical cap. This is a round, soft, latex or plastic cup that fits over the cervix and must be fitted by a caregiver. The cap can be left in place for up to 48 hours after intercourse.  · Sponge. This is a soft, circular piece of polyurethane foam. The sponge has spermicide in it. It is inserted into the vagina after wetting it and before sexual intercourse.  · Spermicides. These are chemicals that kill or block sperm from entering the cervix and uterus. They come in the form of creams, jellies, suppositories, foam, or tablets. They do not require a prescription. They are inserted into the vagina with an applicator before having sexual intercourse.   IUD). This is a T-shaped device that is put in a woman's uterus during a menstrual period to prevent pregnancy. There are 2 types:  Copper IUD. This type of IUD is wrapped in copper wire and is placed inside the uterus. Copper makes the uterus and  fallopian tubes produce a fluid that kills sperm. It can stay in place for 10 years.  Hormone IUD. This type of IUD contains the hormone progestin (synthetic progesterone). The hormone thickens the cervical mucus and prevents sperm from entering the uterus, and it also thins the uterine lining to prevent implantation of a fertilized egg. The hormone can weaken or kill the sperm that get into the uterus. It can stay in place for 5 years. PERMANENT METHODS OF CONTRACEPTION  Female tubal ligation. This is when the woman's fallopian tubes are surgically sealed, tied, or blocked to prevent the egg from traveling to the uterus.  Female sterilization. This is when the female has the tubes that carry sperm tied off (vasectomy).This blocks sperm from entering the vagina during sexual intercourse. After the procedure, the man can still ejaculate fluid (semen). NATURAL PLANNING METHODS  Natural family planning. This is not having sexual intercourse or using a barrier method (condom, diaphragm, cervical cap) on days the woman could become pregnant.  Calendar method. This is keeping track of the length of each menstrual cycle and identifying when you are fertile.  Ovulation method. This is avoiding sexual intercourse during ovulation.  Symptothermal method. This is avoiding sexual intercourse during ovulation, using a thermometer and ovulation symptoms.  Post-ovulation method. This is timing sexual intercourse after you have ovulated. Regardless of which type or method of contraception you choose, it is important that you use condoms to protect against the transmission of sexually transmitted diseases (STDs). Talk with your caregiver about which form of contraception is most appropriate for you. Document Released: 01/09/2005 Document Revised: 04/03/2011 Document Reviewed: 05/18/2010 [facility] Patient Information 2014 [place], [place]. follow up in 2 weeks

## 2012-11-18 NOTE — Progress Notes (Signed)
Subjective:     Patient ID: [name], female   DOB: 27-Aug-1985, 27 y.o.   MRN: [id number]  HPI [name] is a 27 year old black female in complaining of nausea and vomiting and still with back pain.Bleeding some sp elective ab 10/10, was seen here 10/14 by Dr Despina Hidden and was placed on antibiotics.No sex yet.  Review of Systems See HPI Reviewed past medical,surgical, social and family history. Reviewed medications and allergies.     Objective:   Physical Exam BP 100/60  Ht 5\' 2"  (1.575 m)  Wt 168 lb (76.204 kg)  BMI 30.72 kg/m2  LMP 08/23/2012   urine pregnancy test negative then get looked +, no CVAT, has some low back discomfort, no RUQ pain but a little over uterus, no pelvic today  Assessment:     Sp el abortion Nausea and vomiting Back pain    Plan:     Check CMP,CBC and QHCG and GC/CHL   Follow up in 2 weeks Rx phenergan 25 mg 1 every 6 hours prn N/V and continue other meds Review handout on contraception and decide what is best for you

## 2012-11-19 ENCOUNTER — Telehealth: Payer: Self-pay | Admitting: Adult Health

## 2012-11-19 LAB — COMPREHENSIVE METABOLIC PANEL
ALT: 22 U/L (ref 0–35)
AST: 19 U/L (ref 0–37)
Albumin: 4.1 g/dL (ref 3.5–5.2)
Alkaline Phosphatase: 44 U/L (ref 39–117)
Chloride: 107 mEq/L (ref 96–112)
Creat: 0.61 mg/dL (ref 0.50–1.10)
Potassium: 4.1 mEq/L (ref 3.5–5.3)
Sodium: 141 mEq/L (ref 135–145)
Total Bilirubin: 0.3 mg/dL (ref 0.3–1.2)

## 2012-11-19 LAB — CBC
MCH: 29.4 pg (ref 26.0–34.0)
MCV: 87.8 fL (ref 78.0–100.0)
Platelets: 313 10*3/uL (ref 150–400)
RDW: 14.8 % (ref 11.5–15.5)
WBC: 6.9 10*3/uL (ref 4.0–10.5)

## 2012-11-19 LAB — GC/CHLAMYDIA PROBE AMP: CT Probe RNA: NEGATIVE

## 2012-11-19 NOTE — Telephone Encounter (Signed)
Left message I called 

## 2012-12-02 ENCOUNTER — Ambulatory Visit: Payer: Medicaid Other | Admitting: Adult Health

## 2012-12-06 ENCOUNTER — Telehealth: Payer: Self-pay | Admitting: *Deleted

## 2012-12-06 ENCOUNTER — Emergency Department (HOSPITAL_COMMUNITY)
Admission: EM | Admit: 2012-12-06 | Discharge: 2012-12-06 | Disposition: A | Discharge status: Home / Self Care | Payer: Medicaid Other | Attending: Emergency Medicine | Admitting: Emergency Medicine

## 2012-12-06 ENCOUNTER — Encounter (HOSPITAL_COMMUNITY): Payer: Self-pay | Admitting: Emergency Medicine

## 2012-12-06 ENCOUNTER — Telehealth: Payer: Self-pay | Admitting: Obstetrics & Gynecology

## 2012-12-06 DIAGNOSIS — M549 Dorsalgia, unspecified: Secondary | ICD-10-CM | POA: Insufficient documentation

## 2012-12-06 DIAGNOSIS — Z8619 Personal history of other infectious and parasitic diseases: Secondary | ICD-10-CM | POA: Insufficient documentation

## 2012-12-06 DIAGNOSIS — G8929 Other chronic pain: Secondary | ICD-10-CM | POA: Insufficient documentation

## 2012-12-06 DIAGNOSIS — R42 Dizziness and giddiness: Secondary | ICD-10-CM | POA: Insufficient documentation

## 2012-12-06 DIAGNOSIS — N898 Other specified noninflammatory disorders of vagina: Secondary | ICD-10-CM | POA: Insufficient documentation

## 2012-12-06 DIAGNOSIS — F17200 Nicotine dependence, unspecified, uncomplicated: Secondary | ICD-10-CM | POA: Insufficient documentation

## 2012-12-06 DIAGNOSIS — Z8742 Personal history of other diseases of the female genital tract: Secondary | ICD-10-CM | POA: Insufficient documentation

## 2012-12-06 DIAGNOSIS — Z91040 Latex allergy status: Secondary | ICD-10-CM | POA: Insufficient documentation

## 2012-12-06 DIAGNOSIS — J40 Bronchitis, not specified as acute or chronic: Secondary | ICD-10-CM

## 2012-12-06 DIAGNOSIS — J209 Acute bronchitis, unspecified: Secondary | ICD-10-CM | POA: Insufficient documentation

## 2012-12-06 MED ORDER — PSEUDOEPHEDRINE HCL 30 MG PO TABS: 30.0000 mg | ORAL_TABLET | Freq: Four times a day (QID) | ORAL | Status: DC | PRN

## 2012-12-06 MED ORDER — AZITHROMYCIN 250 MG PO TABS: 250.0000 mg | ORAL_TABLET | Freq: Every day | ORAL | Status: DC

## 2012-12-06 MED ORDER — FLUCONAZOLE 100 MG PO TABS
150.0000 mg | ORAL_TABLET | Freq: Once | ORAL | Status: AC
  Administered 2012-12-06: 150 mg via ORAL
  Filled 2012-12-06: qty 2

## 2012-12-06 NOTE — ED Notes (Signed)
Pt c/o cough, fever, congestion x 3 days.

## 2012-12-06 NOTE — Telephone Encounter (Signed)
Pt requesting pain medication, percocet, for back pain. States see Dr. Despina Hidden. Informed pt last appt with Dr. Despina Hidden was in June and would need to make an appt to discuss back pain and pain management. Pt verbalized understanding. Call transferred to front staff for an appt to be made.

## 2012-12-06 NOTE — Telephone Encounter (Signed)
Pt requesting refill on Percocet.

## 2012-12-06 NOTE — Telephone Encounter (Signed)
No we are not going to be treating her back pain or chronic pain issues

## 2012-12-06 NOTE — ED Provider Notes (Signed)
CSN: [id number]     Arrival date & time 12/06/12  4098 History   First MD Initiated Contact with Patient 12/06/12 [id number]     Chief Complaint  Patient presents with  . URI   (Consider location/radiation/quality/duration/timing/severity/associated sxs/prior Treatment) Patient is a 27 y.o. female presenting with URI. The history is provided by the patient.  URI Presenting symptoms: congestion, cough, ear pain, facial pain, fever, rhinorrhea and sore throat   Severity:  Mild Duration:  3 days Timing:  Constant Progression:  Unchanged Chronicity:  New Relieved by:  None tried Worsened by:  Nothing tried Ineffective treatments:  None tried Associated symptoms: sinus pain and sneezing   Associated symptoms: no headaches   Risk factors comment:  Every day smoker  TUCKER STEEDLEY is a 27 y.o. female who presents to the ED with congestion and cough for the past 3 days. She is here with her 62 year old child who also has cough and congestion. Patient had TAB October 5th. But no vaginal bleeding or problems since then except for white discharge after taking the antibiotics. Request something for yeast infection.  Past Medical History  Diagnosis Date  . History of chlamydia   . History of bronchitis   . Abnormal pap 10/2011  . History of trichomoniasis   . Abortion   . Abnormal Pap smear   . Nausea and vomiting 11/18/2012  . Back pain 11/18/2012   Past Surgical History  Procedure Laterality Date  . Mouth surgery    . Elective abortion     Family History  Problem Relation Age of Onset  . Cancer Maternal Aunt     breast  . Hypertension Other   . Diabetes Other    History  Substance Use Topics  . Smoking status: Current Every Day Smoker -- 0.50 packs/day    Types: Cigarettes  . Smokeless tobacco: Not on file  . Alcohol Use: Yes     Comment: occ   OB History   Grav Para Term Preterm Abortions TAB SAB Ect Mult Living   2 1 1  1 1          Review of Systems  Constitutional:  Positive for fever.  HENT: Positive for congestion, ear pain, rhinorrhea, sneezing and sore throat.   Eyes: Negative for visual disturbance.  Respiratory: Positive for cough.   Gastrointestinal: Negative for nausea, vomiting and abdominal pain.  Genitourinary: Positive for vaginal discharge. Negative for dysuria, urgency, frequency and vaginal bleeding.  Musculoskeletal: Back pain: chronic.  Skin: Negative for rash.  Allergic/Immunologic: Negative for immunocompromised state.  Neurological: Positive for light-headedness. Negative for dizziness and headaches.  Psychiatric/Behavioral: The patient is not nervous/anxious.     Allergies  Latex  Home Medications   Current Outpatient Rx  Name  Route  Sig  Dispense  Refill  . cyclobenzaprine (FLEXERIL) 10 MG tablet   Oral   Take 1 tablet (10 mg total) by mouth 3 (three) times daily as needed for muscle spasms.   30 tablet   1   . HYDROcodone-acetaminophen (NORCO/VICODIN) 5-325 MG per tablet   Oral   Take 1-2 tablets by mouth every 6 (six) hours as needed for pain.   20 tablet   0   . ibuprofen (ADVIL,MOTRIN) 200 MG tablet   Oral   Take 200 mg by mouth every 6 (six) hours as needed for pain.         . metroNIDAZOLE (FLAGYL) 500 MG tablet   Oral   Take 1 tablet (  500 mg total) by mouth 2 (two) times daily.   20 tablet   0   . promethazine (PHENERGAN) 25 MG tablet   Oral   Take 1 tablet (25 mg total) by mouth every 6 (six) hours as needed for nausea.   30 tablet   1   . promethazine (PHENERGAN) 25 MG tablet   Oral   Take 1 tablet (25 mg total) by mouth every 6 (six) hours as needed for nausea.   30 tablet   1    BP 114/77  Pulse 89  Temp(Src) 97.9 F (36.6 C) (Oral)  Resp 20  Ht 5\' 2"  (1.575 m)  Wt 170 lb (77.111 kg)  BMI 31.09 kg/m2  SpO2 100%  LMP 11/11/2012 Physical Exam  Nursing note and vitals reviewed. Constitutional: She is oriented to person, place, and time. She appears well-developed and  well-nourished. No distress.  HENT:  Head: Normocephalic and atraumatic.  Right Ear: Tympanic membrane, external ear and ear canal normal.  Left Ear: Tympanic membrane, external ear and ear canal normal.  Nose: Rhinorrhea present. Right sinus exhibits maxillary sinus tenderness. Left sinus exhibits maxillary sinus tenderness.  Mouth/Throat: Uvula is midline, oropharynx is clear and moist and mucous membranes are normal.  Eyes: Conjunctivae and EOM are normal.  Neck: Neck supple.  Cardiovascular: Normal rate, regular rhythm and normal heart sounds.   Pulmonary/Chest: Effort normal. No respiratory distress. Rhonchi: occasional.  Musculoskeletal: Normal range of motion.  Lymphadenopathy:    She has no cervical adenopathy.  Neurological: She is alert and oriented to person, place, and time. No cranial nerve deficit.  Skin: Skin is warm and dry.  Psychiatric: She has a normal mood and affect. Her behavior is normal.    ED Course  Procedures  MDM  27 y.o. female with bronchitis. Will treat with cough medication and Z-Pak. She is to follow up with her PCP.  Discussed with the patient and all questioned fully answered. She will return if any problems arise.    Medication List    TAKE these medications       azithromycin 250 MG tablet  Commonly known as:  ZITHROMAX  Take 1 tablet (250 mg total) by mouth daily. Take first 2 tablets together, then 1 every day until finished.     pseudoephedrine 30 MG tablet  Commonly known as:  SUDAFED  Take 1 tablet (30 mg total) by mouth every 6 (six) hours as needed for congestion.      ASK your doctor about these medications       cyclobenzaprine 10 MG tablet  Commonly known as:  FLEXERIL  Take 1 tablet (10 mg total) by mouth 3 (three) times daily as needed for muscle spasms.     HYDROcodone-acetaminophen 5-325 MG per tablet  Commonly known as:  NORCO/VICODIN  Take 1-2 tablets by mouth every 6 (six) hours as needed for pain.     ibuprofen  200 MG tablet  Commonly known as:  ADVIL,MOTRIN  Take 200 mg by mouth every 6 (six) hours as needed for pain.     metroNIDAZOLE 500 MG tablet  Commonly known as:  FLAGYL  Take 1 tablet (500 mg total) by mouth 2 (two) times daily.     promethazine 25 MG tablet  Commonly known as:  PHENERGAN  Take 1 tablet (25 mg total) by mouth every 6 (six) hours as needed for nausea.     promethazine 25 MG tablet  Commonly known as:  PHENERGAN  Take 1  tablet (25 mg total) by mouth every 6 (six) hours as needed for nausea.           [facility] Orlene Och, [place] 12/06/12 [phone]

## 2012-12-09 ENCOUNTER — Encounter (INDEPENDENT_AMBULATORY_CARE_PROVIDER_SITE_OTHER): Payer: Self-pay

## 2012-12-09 ENCOUNTER — Encounter: Payer: Self-pay | Admitting: Women's Health

## 2012-12-09 ENCOUNTER — Ambulatory Visit (INDEPENDENT_AMBULATORY_CARE_PROVIDER_SITE_OTHER): Payer: Medicaid Other | Admitting: Women's Health

## 2012-12-09 VITALS — BP 120/78 | Ht 62.0 in | Wt 165.0 lb

## 2012-12-09 DIAGNOSIS — M545 Low back pain, unspecified: Secondary | ICD-10-CM

## 2012-12-09 NOTE — Progress Notes (Signed)
Patient ID: [name], female   DOB: February 05, 1985, 27 y.o.   MRN: [id number] [name] is a 27 y.o. [id number] female who is here w/ report of low back spasms/pain since EAB in [place] on 10/10. She has called requesting refill on percocet, and per [facility] we will not treat LBP or any chronic pain conditions. She has an appt w/ [facility] on 11/20 for post TAB exam and to discuss contraception. She states she never had any problems w/ back until after TAB.  Began period yesterday. Has been taking goody powder, BC, ibuprofen at home.  She does not have a PCP.  O: BP 120/78  Ht 5\' 2"  (1.575 m)  Wt 165 lb (74.844 kg)  BMI 30.17 kg/m2  LMP 12/08/2012    A: LBP/spasms post TAB on 10/10  P: Reviewed LBP relief measures including ibuprofen, apap, warm showers/baths, heating pad to lower back for no   longer than at a time, massage therapy, chiropractor therapy     Find and make appt w/ PCP if LBP continues  Cheral Marker, CNM, [facility] 12/09/2012 3:38 PM

## 2012-12-09 NOTE — Patient Instructions (Addendum)
You can try tylenol, or ibuprofen, warm showers (baths when able), heating pad for no longer than 20 minutes at a time, massage therapy, chiropractor therapy, consult a primary care doctor if continues.   Back Pain, Adult Low back pain is very common. About 1 in 5 people have back pain.The cause of low back pain is rarely dangerous. The pain often gets better over time.About half of people with a sudden onset of back pain feel better in just 2 weeks. About 8 in 10 people feel better by 6 weeks.  CAUSES Some common causes of back pain include:  Strain of the muscles or ligaments supporting the spine.  Wear and tear (degeneration) of the spinal discs.  Arthritis.  Direct injury to the back. DIAGNOSIS Most of the time, the direct cause of low back pain is not known.However, back pain can be treated effectively even when the exact cause of the pain is unknown.Answering your caregiver's questions about your overall health and symptoms is one of the most accurate ways to make sure the cause of your pain is not dangerous. If your caregiver needs more information, he or she may order lab work or imaging tests (X-rays or MRIs).However, even if imaging tests show changes in your back, this usually does not require surgery. HOME CARE INSTRUCTIONS For many people, back pain returns.Since low back pain is rarely dangerous, it is often a condition that people can learn to [facility] their own.   Remain active. It is stressful on the back to sit or stand in one place. Do not sit, drive, or stand in one place for more than 30 minutes at a time. Take short walks on level surfaces as soon as pain allows.Try to increase the length of time you walk each day.  Do not stay in bed.Resting more than 1 or 2 days can delay your recovery.  Do not avoid exercise or work.Your body is made to move.It is not dangerous to be active, even though your back may hurt.Your back will likely heal faster if you return to  being active before your pain is gone.  Pay attention to your body when you bend and lift. Many people have less discomfortwhen lifting if they bend their knees, keep the load close to their bodies,and avoid twisting. Often, the most comfortable positions are those that put less stress on your recovering back.  Find a comfortable position to sleep. Use a firm mattress and lie on your side with your knees slightly bent. If you lie on your back, put a pillow under your knees.  Only take over-the-counter or prescription medicines as directed by your caregiver. Over-the-counter medicines to reduce pain and inflammation are often the most helpful.Your caregiver may prescribe muscle relaxant drugs.These medicines help dull your pain so you can more quickly return to your normal activities and healthy exercise.  Put ice on the injured area.  Put ice in a plastic bag.  Place a towel between your skin and the bag.  Leave the ice on for 15-20 minutes, 03-04 times a day for the first 2 to 3 days. After that, ice and heat may be alternated to reduce pain and spasms.  Ask your caregiver about trying back exercises and gentle massage. This may be of some benefit.  Avoid feeling anxious or stressed.Stress increases muscle tension and can worsen back pain.It is important to recognize when you are anxious or stressed and learn ways to manage it.Exercise is a great option. SEEK MEDICAL CARE IF:  You have pain that is not relieved with rest or medicine.  You have pain that does not improve in 1 week.  You have new symptoms.  You are generally not feeling well. SEEK IMMEDIATE MEDICAL CARE IF:   You have pain that radiates from your back into your legs.  You develop new bowel or bladder control problems.  You have unusual weakness or numbness in your arms or legs.  You develop nausea or vomiting.  You develop abdominal pain.  You feel faint. Document Released: 01/09/2005 Document Revised:  07/11/2011 Document Reviewed: 05/30/2010 [facility] Patient Information 2014 [place], [place].

## 2012-12-10 NOTE — ED Provider Notes (Signed)
Medical screening examination/treatment/procedure(s) were performed by non-physician practitioner and as supervising physician I was immediately available for consultation/collaboration.  EKG Interpretation   None         Naythen Heikkila J Blakley Michna, MD 12/10/12 2219 

## 2012-12-12 ENCOUNTER — Ambulatory Visit: Payer: Medicaid Other | Admitting: Adult Health

## 2012-12-17 NOTE — Telephone Encounter (Signed)
Several attempts made to contact pt in regards to Dr. Despina Hidden not going to treat chronic pain issues.

## 2013-04-21 ENCOUNTER — Emergency Department (HOSPITAL_COMMUNITY)
Admission: EM | Admit: 2013-04-21 | Discharge: 2013-04-21 | Disposition: A | Discharge status: Home / Self Care | Payer: Medicaid Other | Attending: Emergency Medicine | Admitting: Emergency Medicine

## 2013-04-21 ENCOUNTER — Encounter (HOSPITAL_COMMUNITY): Payer: Self-pay | Admitting: Emergency Medicine

## 2013-04-21 ENCOUNTER — Emergency Department (HOSPITAL_COMMUNITY): Payer: Medicaid Other

## 2013-04-21 DIAGNOSIS — S4980XA Other specified injuries of shoulder and upper arm, unspecified arm, initial encounter: Secondary | ICD-10-CM | POA: Insufficient documentation

## 2013-04-21 DIAGNOSIS — S0993XA Unspecified injury of face, initial encounter: Secondary | ICD-10-CM | POA: Insufficient documentation

## 2013-04-21 DIAGNOSIS — Y9389 Activity, other specified: Secondary | ICD-10-CM | POA: Insufficient documentation

## 2013-04-21 DIAGNOSIS — S8000XA Contusion of unspecified knee, initial encounter: Secondary | ICD-10-CM | POA: Insufficient documentation

## 2013-04-21 DIAGNOSIS — Z8742 Personal history of other diseases of the female genital tract: Secondary | ICD-10-CM | POA: Insufficient documentation

## 2013-04-21 DIAGNOSIS — F17200 Nicotine dependence, unspecified, uncomplicated: Secondary | ICD-10-CM | POA: Insufficient documentation

## 2013-04-21 DIAGNOSIS — Z8619 Personal history of other infectious and parasitic diseases: Secondary | ICD-10-CM | POA: Insufficient documentation

## 2013-04-21 DIAGNOSIS — Z91040 Latex allergy status: Secondary | ICD-10-CM | POA: Insufficient documentation

## 2013-04-21 DIAGNOSIS — Y92410 Unspecified street and highway as the place of occurrence of the external cause: Secondary | ICD-10-CM | POA: Insufficient documentation

## 2013-04-21 DIAGNOSIS — S298XXA Other specified injuries of thorax, initial encounter: Secondary | ICD-10-CM | POA: Insufficient documentation

## 2013-04-21 DIAGNOSIS — Z8709 Personal history of other diseases of the respiratory system: Secondary | ICD-10-CM | POA: Insufficient documentation

## 2013-04-21 DIAGNOSIS — S199XXA Unspecified injury of neck, initial encounter: Secondary | ICD-10-CM

## 2013-04-21 DIAGNOSIS — S46909A Unspecified injury of unspecified muscle, fascia and tendon at shoulder and upper arm level, unspecified arm, initial encounter: Secondary | ICD-10-CM | POA: Insufficient documentation

## 2013-04-21 DIAGNOSIS — S8002XA Contusion of left knee, initial encounter: Secondary | ICD-10-CM

## 2013-04-21 DIAGNOSIS — Z79899 Other long term (current) drug therapy: Secondary | ICD-10-CM | POA: Insufficient documentation

## 2013-04-21 MED ORDER — ACETAMINOPHEN 325 MG PO TABS
650.0000 mg | ORAL_TABLET | Freq: Once | ORAL | Status: AC
  Administered 2013-04-21: 650 mg via ORAL
  Filled 2013-04-21: qty 2

## 2013-04-21 MED ORDER — IBUPROFEN 800 MG PO TABS: 800.0000 mg | ORAL_TABLET | Freq: Three times a day (TID) | ORAL | Status: DC

## 2013-04-21 MED ORDER — IBUPROFEN 800 MG PO TABS
800.0000 mg | ORAL_TABLET | Freq: Once | ORAL | Status: AC
  Administered 2013-04-21: 800 mg via ORAL
  Filled 2013-04-21: qty 1

## 2013-04-21 MED ORDER — CYCLOBENZAPRINE HCL 10 MG PO TABS: 10.0000 mg | ORAL_TABLET | Freq: Three times a day (TID) | ORAL | Status: DC | PRN

## 2013-04-21 NOTE — ED Notes (Signed)
C-collar removed by EDP.   
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
## 2013-04-26 ENCOUNTER — Emergency Department (HOSPITAL_COMMUNITY)
Admission: EM | Admit: 2013-04-26 | Discharge: 2013-04-27 | Disposition: A | Discharge status: Home / Self Care | Payer: Medicaid Other | Attending: Emergency Medicine | Admitting: Emergency Medicine

## 2013-04-26 ENCOUNTER — Encounter (HOSPITAL_COMMUNITY): Payer: Self-pay | Admitting: Emergency Medicine

## 2013-04-26 DIAGNOSIS — F411 Generalized anxiety disorder: Secondary | ICD-10-CM | POA: Insufficient documentation

## 2013-04-26 DIAGNOSIS — Z8709 Personal history of other diseases of the respiratory system: Secondary | ICD-10-CM | POA: Insufficient documentation

## 2013-04-26 DIAGNOSIS — Z8619 Personal history of other infectious and parasitic diseases: Secondary | ICD-10-CM | POA: Insufficient documentation

## 2013-04-26 DIAGNOSIS — R0602 Shortness of breath: Secondary | ICD-10-CM | POA: Insufficient documentation

## 2013-04-26 DIAGNOSIS — Z3202 Encounter for pregnancy test, result negative: Secondary | ICD-10-CM | POA: Insufficient documentation

## 2013-04-26 DIAGNOSIS — R209 Unspecified disturbances of skin sensation: Secondary | ICD-10-CM | POA: Insufficient documentation

## 2013-04-26 DIAGNOSIS — R002 Palpitations: Secondary | ICD-10-CM | POA: Insufficient documentation

## 2013-04-26 DIAGNOSIS — R51 Headache: Secondary | ICD-10-CM | POA: Insufficient documentation

## 2013-04-26 DIAGNOSIS — F17200 Nicotine dependence, unspecified, uncomplicated: Secondary | ICD-10-CM | POA: Insufficient documentation

## 2013-04-26 DIAGNOSIS — Z91040 Latex allergy status: Secondary | ICD-10-CM | POA: Insufficient documentation

## 2013-04-26 DIAGNOSIS — Z87828 Personal history of other (healed) physical injury and trauma: Secondary | ICD-10-CM | POA: Insufficient documentation

## 2013-04-26 DIAGNOSIS — Z79899 Other long term (current) drug therapy: Secondary | ICD-10-CM | POA: Insufficient documentation

## 2013-04-26 DIAGNOSIS — R5383 Other fatigue: Secondary | ICD-10-CM

## 2013-04-26 DIAGNOSIS — R42 Dizziness and giddiness: Secondary | ICD-10-CM | POA: Insufficient documentation

## 2013-04-26 DIAGNOSIS — R5381 Other malaise: Secondary | ICD-10-CM | POA: Insufficient documentation

## 2013-04-26 LAB — URINALYSIS, ROUTINE W REFLEX MICROSCOPIC
Bilirubin Urine: NEGATIVE
Glucose, UA: NEGATIVE mg/dL
Hgb urine dipstick: NEGATIVE
Ketones, ur: NEGATIVE mg/dL
Leukocytes, UA: NEGATIVE
NITRITE: NEGATIVE
PROTEIN: NEGATIVE mg/dL
Urobilinogen, UA: 0.2 mg/dL (ref 0.0–1.0)
pH: 6.5 (ref 5.0–8.0)

## 2013-04-26 LAB — CBC
HCT: 34.2 % — ABNORMAL LOW (ref 36.0–46.0)
Hemoglobin: 11.3 g/dL — ABNORMAL LOW (ref 12.0–15.0)
MCH: 29.8 pg (ref 26.0–34.0)
MCHC: 33 g/dL (ref 30.0–36.0)
MCV: 90.2 fL (ref 78.0–100.0)
PLATELETS: 218 10*3/uL (ref 150–400)
RBC: 3.79 MIL/uL — ABNORMAL LOW (ref 3.87–5.11)
RDW: 13.7 % (ref 11.5–15.5)
WBC: 4.4 10*3/uL (ref 4.0–10.5)

## 2013-04-26 LAB — BASIC METABOLIC PANEL
BUN: 7 mg/dL (ref 6–23)
CHLORIDE: 103 meq/L (ref 96–112)
CO2: 25 mEq/L (ref 19–32)
CREATININE: 0.71 mg/dL (ref 0.50–1.10)
Calcium: 9.1 mg/dL (ref 8.4–10.5)
Glucose, Bld: 112 mg/dL — ABNORMAL HIGH (ref 70–99)
POTASSIUM: 3.4 meq/L — AB (ref 3.7–5.3)
Sodium: 140 mEq/L (ref 137–147)

## 2013-04-26 LAB — CBG MONITORING, ED: GLUCOSE-CAPILLARY: 112 mg/dL — AB (ref 70–99)

## 2013-04-26 LAB — POC URINE PREG, ED: PREG TEST UR: NEGATIVE

## 2013-04-26 MED ORDER — SODIUM CHLORIDE 0.9 % IV SOLN
INTRAVENOUS | Status: DC
  Administered 2013-04-26: 23:00:00 via INTRAVENOUS

## 2013-04-26 MED ORDER — LORAZEPAM 1 MG PO TABS
1.0000 mg | ORAL_TABLET | Freq: Once | ORAL | Status: AC
  Administered 2013-04-26: 1 mg via ORAL
  Filled 2013-04-26: qty 1
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
## 2013-04-26 NOTE — ED Notes (Signed)
PT C/O FEELING LIKE SHE IS GOING TO PASS OUT X 30 MINS. PT ALSO C/O FEELING JITTERY.

## 2013-04-27 MED ORDER — POTASSIUM CHLORIDE CRYS ER 20 MEQ PO TBCR
40.0000 meq | EXTENDED_RELEASE_TABLET | Freq: Once | ORAL | Status: AC
  Administered 2013-04-27: 40 meq via ORAL
  Filled 2013-04-27: qty 2

## 2013-04-27 MED ORDER — HYDROXYZINE HCL 25 MG PO TABS: 25.0000 mg | ORAL_TABLET | Freq: Four times a day (QID) | ORAL | Status: DC

## 2013-04-27 NOTE — Discharge Instructions (Signed)

## 2013-07-31 ENCOUNTER — Encounter: Payer: Self-pay | Admitting: Obstetrics and Gynecology

## 2013-07-31 ENCOUNTER — Ambulatory Visit (INDEPENDENT_AMBULATORY_CARE_PROVIDER_SITE_OTHER): Payer: Medicaid Other | Admitting: Obstetrics and Gynecology

## 2013-07-31 VITALS — BP 122/88 | Ht 62.0 in | Wt 153.0 lb

## 2013-07-31 DIAGNOSIS — Z3009 Encounter for other general counseling and advice on contraception: Secondary | ICD-10-CM | POA: Insufficient documentation

## 2013-07-31 DIAGNOSIS — Z3202 Encounter for pregnancy test, result negative: Secondary | ICD-10-CM

## 2013-07-31 DIAGNOSIS — N760 Acute vaginitis: Secondary | ICD-10-CM

## 2013-07-31 DIAGNOSIS — B9689 Other specified bacterial agents as the cause of diseases classified elsewhere: Secondary | ICD-10-CM | POA: Insufficient documentation

## 2013-07-31 DIAGNOSIS — A499 Bacterial infection, unspecified: Secondary | ICD-10-CM

## 2013-07-31 DIAGNOSIS — Z3200 Encounter for pregnancy test, result unknown: Secondary | ICD-10-CM

## 2013-07-31 DIAGNOSIS — N898 Other specified noninflammatory disorders of vagina: Secondary | ICD-10-CM

## 2013-07-31 DIAGNOSIS — F4310 Post-traumatic stress disorder, unspecified: Secondary | ICD-10-CM | POA: Insufficient documentation

## 2013-07-31 LAB — POCT WET PREP WITH KOH
BACTERIA WET PREP HPF POC: NORMAL
KOH PREP POC: NEGATIVE
TRICHOMONAS UA: NEGATIVE

## 2013-07-31 LAB — POCT URINE PREGNANCY: Preg Test, Ur: NEGATIVE

## 2013-07-31 MED ORDER — METRONIDAZOLE 500 MG PO TABS: 500.0000 mg | ORAL_TABLET | Freq: Two times a day (BID) | ORAL | Status: DC

## 2013-07-31 MED ORDER — ALPRAZOLAM 1 MG PO TABS: 1.0000 mg | ORAL_TABLET | Freq: Once | ORAL | Status: DC

## 2013-07-31 MED ORDER — MEDROXYPROGESTERONE ACETATE 10 MG PO TABS: 10.0000 mg | ORAL_TABLET | Freq: Every day | ORAL | Status: DC

## 2013-07-31 NOTE — Addendum Note (Signed)
Addended by: Tilda BurrowFERGUSON, Euline Kimbler V on: 07/31/2013 05:04 PM   Modules accepted: Orders
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
## 2013-07-31 NOTE — Patient Instructions (Signed)
Bacterial Vaginosis Bacterial vaginosis is a vaginal infection that occurs when the normal balance of bacteria in the vagina is disrupted. It results from an overgrowth of certain bacteria. This is the most common vaginal infection in women of childbearing age. Treatment is important to prevent complications, especially in pregnant women, as it can cause a premature delivery. CAUSES  Bacterial vaginosis is caused by an increase in harmful bacteria that are normally present in smaller amounts in the vagina. Several different kinds of bacteria can cause bacterial vaginosis. However, the reason that the condition develops is not fully understood. RISK FACTORS Certain activities or behaviors can put you at an increased risk of developing bacterial vaginosis, including:  Having a new sex partner or multiple sex partners.  Douching.  Using an intrauterine device (IUD) for contraception. Women do not get bacterial vaginosis from toilet seats, bedding, swimming pools, or contact with objects around them. SIGNS AND SYMPTOMS  Some women with bacterial vaginosis have no signs or symptoms. Common symptoms include:  Grey vaginal discharge.  A fishlike odor with discharge, especially after sexual intercourse.  Itching or burning of the vagina and vulva.  Burning or pain with urination. DIAGNOSIS  Your health care provider will take a medical history and examine the vagina for signs of bacterial vaginosis. A sample of vaginal fluid may be taken. Your health care provider will look at this sample under a microscope to check for bacteria and abnormal cells. A vaginal pH test may also be done.  TREATMENT  Bacterial vaginosis may be treated with antibiotic medicines. These may be given in the form of a pill or a vaginal cream. A second round of antibiotics may be prescribed if the condition comes back after treatment.  HOME CARE INSTRUCTIONS   Only take over-the-counter or prescription medicines as  directed by your health care provider.  If antibiotic medicine was prescribed, take it as directed. Make sure you finish it even if you start to feel better.  Do not have sex until treatment is completed.  Tell all sexual partners that you have a vaginal infection. They should see their health care provider and be treated if they have problems, such as a mild rash or itching.  Practice safe sex by using condoms and only having one sex partner. SEEK MEDICAL CARE IF:   Your symptoms are not improving after 3 days of treatment.  You have increased discharge or pain.  You have a fever. MAKE SURE YOU:   Understand these instructions.  Will watch your condition.  Will get help right away if you are not doing well or get worse. FOR MORE INFORMATION  Centers for Disease Control and Prevention, Division of STD Prevention: www.cdc.gov/std American Sexual Health Association (ASHA): www.ashastd.org  Document Released: 01/09/2005 Document Revised: 10/30/2012 Document Reviewed: 08/21/2012 Exit[employer] Patient Information 2015 [employer]. This information is not intended to replace advice given to you by your health care provider. Make sure you discuss any questions you have with your health care provider.  

## 2013-08-01 ENCOUNTER — Telehealth: Payer: Self-pay | Admitting: *Deleted

## 2013-08-01 ENCOUNTER — Other Ambulatory Visit: Payer: Self-pay | Admitting: Obstetrics and Gynecology

## 2013-08-01 ENCOUNTER — Encounter: Payer: Self-pay | Admitting: Obstetrics and Gynecology

## 2013-08-01 DIAGNOSIS — A549 Gonococcal infection, unspecified: Secondary | ICD-10-CM | POA: Insufficient documentation

## 2013-08-01 LAB — GC/CHLAMYDIA PROBE AMP
CT Probe RNA: POSITIVE — AB
GC Probe RNA: POSITIVE — AB

## 2013-08-01 MED ORDER — AZITHROMYCIN 500 MG PO TABS: 1000.0000 mg | ORAL_TABLET | Freq: Once | ORAL | Status: DC

## 2013-08-04 NOTE — Telephone Encounter (Signed)
I have tried multiple times to contact the pt but have been unable to reach her. I spoke with her Grandmother the other day and got a different number, but I was still unable to reach pt at that number either.

## 2013-08-04 NOTE — Telephone Encounter (Signed)
Pt called and I informed her that she had both a positive GC and CHL. I advised the pt that she would have to make an appointment for a rocephin injection tomorrow and a Rx would be called into her pharmacy. Pt was advised to let her partner know of the positive test results and get him treated. Pt was also advised not to have sex until she was seen here for POC. Pt verbalized understanding and was switched to front office to make that appointment.

## 2013-08-05 ENCOUNTER — Ambulatory Visit: Payer: Medicaid Other

## 2013-08-06 ENCOUNTER — Encounter: Payer: Self-pay | Admitting: Adult Health

## 2013-08-06 ENCOUNTER — Ambulatory Visit (INDEPENDENT_AMBULATORY_CARE_PROVIDER_SITE_OTHER): Payer: Medicaid Other | Admitting: Adult Health

## 2013-08-06 DIAGNOSIS — A749 Chlamydial infection, unspecified: Secondary | ICD-10-CM

## 2013-08-06 DIAGNOSIS — A549 Gonococcal infection, unspecified: Secondary | ICD-10-CM

## 2013-08-06 DIAGNOSIS — A5400 Gonococcal infection of lower genitourinary tract, unspecified: Secondary | ICD-10-CM

## 2013-08-06 MED ORDER — CEFTRIAXONE SODIUM 1 G IJ SOLR
250.0000 mg | Freq: Once | INTRAMUSCULAR | Status: AC
  Administered 2013-08-06: 250 mg via INTRAMUSCULAR

## 2013-08-15 ENCOUNTER — Ambulatory Visit: Payer: Medicaid Other | Admitting: Obstetrics and Gynecology

## 2013-08-21 ENCOUNTER — Emergency Department (HOSPITAL_COMMUNITY)
Admission: EM | Admit: 2013-08-21 | Discharge: 2013-08-21 | Disposition: A | Discharge status: Home / Self Care | Payer: MEDICAID | Attending: Emergency Medicine | Admitting: Emergency Medicine

## 2013-08-21 ENCOUNTER — Encounter (HOSPITAL_COMMUNITY): Payer: Self-pay | Admitting: Emergency Medicine

## 2013-08-21 DIAGNOSIS — Z91040 Latex allergy status: Secondary | ICD-10-CM | POA: Diagnosis not present

## 2013-08-21 DIAGNOSIS — F411 Generalized anxiety disorder: Secondary | ICD-10-CM | POA: Insufficient documentation

## 2013-08-21 DIAGNOSIS — F17200 Nicotine dependence, unspecified, uncomplicated: Secondary | ICD-10-CM | POA: Diagnosis not present

## 2013-08-21 DIAGNOSIS — Z8739 Personal history of other diseases of the musculoskeletal system and connective tissue: Secondary | ICD-10-CM | POA: Insufficient documentation

## 2013-08-21 DIAGNOSIS — Z791 Long term (current) use of non-steroidal anti-inflammatories (NSAID): Secondary | ICD-10-CM | POA: Diagnosis not present

## 2013-08-21 DIAGNOSIS — Z8619 Personal history of other infectious and parasitic diseases: Secondary | ICD-10-CM | POA: Diagnosis not present

## 2013-08-21 DIAGNOSIS — F419 Anxiety disorder, unspecified: Secondary | ICD-10-CM

## 2013-08-21 DIAGNOSIS — Z87828 Personal history of other (healed) physical injury and trauma: Secondary | ICD-10-CM | POA: Insufficient documentation

## 2013-08-21 DIAGNOSIS — Z8709 Personal history of other diseases of the respiratory system: Secondary | ICD-10-CM | POA: Diagnosis not present

## 2013-08-21 MED ORDER — ALPRAZOLAM 1 MG PO TABS: 1.0000 mg | ORAL_TABLET | Freq: Three times a day (TID) | ORAL | Status: DC | PRN

## 2013-08-21 MED ORDER — LORAZEPAM 1 MG PO TABS
1.0000 mg | ORAL_TABLET | Freq: Once | ORAL | Status: AC
  Administered 2013-08-21: 1 mg via ORAL
  Filled 2013-08-21: qty 1

## 2013-08-21 NOTE — Discharge Instructions (Signed)
Follow up with [facility] for recheck
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
## 2013-08-21 NOTE — ED Notes (Signed)
NAD noted at time of d/c inst 
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
## 2013-10-04 ENCOUNTER — Emergency Department (HOSPITAL_COMMUNITY)
Admission: EM | Admit: 2013-10-04 | Discharge: 2013-10-04 | Disposition: A | Discharge status: Home / Self Care | Payer: Medicaid Other | Attending: Emergency Medicine | Admitting: Emergency Medicine

## 2013-10-04 ENCOUNTER — Encounter (HOSPITAL_COMMUNITY): Payer: Self-pay | Admitting: Emergency Medicine

## 2013-10-04 ENCOUNTER — Emergency Department (HOSPITAL_COMMUNITY): Payer: Medicaid Other

## 2013-10-04 DIAGNOSIS — F411 Generalized anxiety disorder: Secondary | ICD-10-CM | POA: Insufficient documentation

## 2013-10-04 DIAGNOSIS — G43909 Migraine, unspecified, not intractable, without status migrainosus: Secondary | ICD-10-CM | POA: Diagnosis not present

## 2013-10-04 DIAGNOSIS — R51 Headache: Secondary | ICD-10-CM | POA: Insufficient documentation

## 2013-10-04 DIAGNOSIS — Z3202 Encounter for pregnancy test, result negative: Secondary | ICD-10-CM | POA: Diagnosis not present

## 2013-10-04 DIAGNOSIS — Z91040 Latex allergy status: Secondary | ICD-10-CM | POA: Insufficient documentation

## 2013-10-04 DIAGNOSIS — Z79899 Other long term (current) drug therapy: Secondary | ICD-10-CM | POA: Insufficient documentation

## 2013-10-04 DIAGNOSIS — F17200 Nicotine dependence, unspecified, uncomplicated: Secondary | ICD-10-CM | POA: Diagnosis not present

## 2013-10-04 DIAGNOSIS — G43901 Migraine, unspecified, not intractable, with status migrainosus: Secondary | ICD-10-CM

## 2013-10-04 DIAGNOSIS — Z8709 Personal history of other diseases of the respiratory system: Secondary | ICD-10-CM | POA: Insufficient documentation

## 2013-10-04 DIAGNOSIS — Z8619 Personal history of other infectious and parasitic diseases: Secondary | ICD-10-CM | POA: Insufficient documentation

## 2013-10-04 LAB — RAPID URINE DRUG SCREEN, HOSP PERFORMED
Amphetamines: NOT DETECTED
BARBITURATES: NOT DETECTED
Benzodiazepines: POSITIVE — AB
COCAINE: POSITIVE — AB
Opiates: NOT DETECTED
Tetrahydrocannabinol: POSITIVE — AB

## 2013-10-04 LAB — URINALYSIS, ROUTINE W REFLEX MICROSCOPIC
BILIRUBIN URINE: NEGATIVE
Glucose, UA: NEGATIVE mg/dL
Hgb urine dipstick: NEGATIVE
Ketones, ur: NEGATIVE mg/dL
Leukocytes, UA: NEGATIVE
NITRITE: NEGATIVE
PH: 6 (ref 5.0–8.0)
SPECIFIC GRAVITY, URINE: 1.025 (ref 1.005–1.030)
Urobilinogen, UA: 0.2 mg/dL (ref 0.0–1.0)

## 2013-10-04 LAB — BASIC METABOLIC PANEL
ANION GAP: 12 (ref 5–15)
BUN: 4 mg/dL — ABNORMAL LOW (ref 6–23)
CHLORIDE: 104 meq/L (ref 96–112)
CO2: 23 meq/L (ref 19–32)
Calcium: 8.9 mg/dL (ref 8.4–10.5)
Creatinine, Ser: 0.6 mg/dL (ref 0.50–1.10)
GFR calc Af Amer: >90 mL/min (ref >90)
GFR calc non Af Amer: >90 mL/min (ref >90)
Glucose, Bld: 92 mg/dL (ref 70–99)
POTASSIUM: 4 meq/L (ref 3.7–5.3)
SODIUM: 139 meq/L (ref 137–147)

## 2013-10-04 LAB — URINE MICROSCOPIC-ADD ON

## 2013-10-04 LAB — CBC WITH DIFFERENTIAL/PLATELET
BASOS ABS: 0 10*3/uL (ref 0.0–0.1)
BASOS PCT: 1 % (ref 0–1)
Eosinophils Absolute: 0.8 10*3/uL — ABNORMAL HIGH (ref 0.0–0.7)
Eosinophils Relative: 12 % — ABNORMAL HIGH (ref 0–5)
HCT: 35.9 % — ABNORMAL LOW (ref 36.0–46.0)
Hemoglobin: 11.9 g/dL — ABNORMAL LOW (ref 12.0–15.0)
LYMPHS PCT: 30 % (ref 12–46)
Lymphs Abs: 2 10*3/uL (ref 0.7–4.0)
MCH: 28.7 pg (ref 26.0–34.0)
MCHC: 33.1 g/dL (ref 30.0–36.0)
MCV: 86.7 fL (ref 78.0–100.0)
Monocytes Absolute: 0.9 10*3/uL (ref 0.1–1.0)
Monocytes Relative: 14 % — ABNORMAL HIGH (ref 3–12)
NEUTROS ABS: 2.9 10*3/uL (ref 1.7–7.7)
NEUTROS PCT: 43 % (ref 43–77)
PLATELETS: 232 10*3/uL (ref 150–400)
RBC: 4.14 MIL/uL (ref 3.87–5.11)
RDW: 13.9 % (ref 11.5–15.5)
WBC: 6.6 10*3/uL (ref 4.0–10.5)

## 2013-10-04 LAB — POC URINE PREG, ED: PREG TEST UR: NEGATIVE

## 2013-10-04 MED ORDER — METHYLPREDNISOLONE SODIUM SUCC 125 MG IJ SOLR
125.0000 mg | Freq: Once | INTRAMUSCULAR | Status: AC
  Administered 2013-10-04: 125 mg via INTRAVENOUS
  Filled 2013-10-04: qty 2

## 2013-10-04 MED ORDER — KETOROLAC TROMETHAMINE 30 MG/ML IJ SOLN
30.0000 mg | Freq: Once | INTRAMUSCULAR | Status: AC
  Administered 2013-10-04: 30 mg via INTRAVENOUS
  Filled 2013-10-04: qty 1

## 2013-10-04 MED ORDER — SODIUM CHLORIDE 0.9 % IV SOLN
Freq: Once | INTRAVENOUS | Status: AC
  Administered 2013-10-04: 150 mL/h via INTRAVENOUS

## 2013-10-04 MED ORDER — DIPHENHYDRAMINE HCL 50 MG/ML IJ SOLN
25.0000 mg | Freq: Once | INTRAMUSCULAR | Status: AC
  Administered 2013-10-04: 25 mg via INTRAVENOUS
  Filled 2013-10-04: qty 1

## 2013-10-04 MED ORDER — TRAMADOL HCL 50 MG PO TABS: 50.0000 mg | ORAL_TABLET | Freq: Four times a day (QID) | ORAL | Status: DC | PRN

## 2013-10-04 MED ORDER — METOCLOPRAMIDE HCL 5 MG/ML IJ SOLN
10.0000 mg | Freq: Once | INTRAMUSCULAR | Status: AC
  Administered 2013-10-04: 10 mg via INTRAVENOUS
  Filled 2013-10-04: qty 2

## 2013-10-04 NOTE — ED Notes (Signed)
Pt reports a migraine that started this morning when she woke up, pt reports one episode of vomiting and sharp pain on the right side of her head.

## 2013-10-04 NOTE — Discharge Instructions (Signed)

## 2013-10-04 NOTE — ED Provider Notes (Signed)
CSN: [id number]     Arrival date & time 10/04/13  1247 History   First MD Initiated Contact with Patient 10/04/13 1258    This chart was scribed for Gilda Crease, * by Freida Busman, ED Scribe. This patient was seen in room APA18/APA18 and the patient's care was started 1:16 PM.  Chief Complaint  Patient presents with  . Migraine     The history is provided by the patient.   HPI Comments:  [name] is a 28 y.o. female who presents to the Emergency Department complaining of constant HA with a sharp pain to the right side of her head, since waking this morning. Pt reports h/o migraines with the same pain to the same side of her head but states pain today is a bit worse. Pt reports associated nausea and vomiting today. No alleviating factors noted. Pt has never been formally diagnosed with migraines.  Past Medical History  Diagnosis Date  . History of chlamydia   . History of bronchitis   . Abnormal pap 10/2011  . History of trichomoniasis   . Abortion   . Abnormal Pap smear   . Nausea and vomiting 11/18/2012  . Back pain 11/18/2012  . Anxiety   . History of gonorrhea    Past Surgical History  Procedure Laterality Date  . Mouth surgery    . Elective abortion     Family History  Problem Relation Age of Onset  . Cancer Maternal Aunt     breast  . Hypertension Other   . Diabetes Other    History  Substance Use Topics  . Smoking status: Current Every Day Smoker -- 0.50 packs/day for 10 years    Types: Cigarettes  . Smokeless tobacco: Never Used  . Alcohol Use: Yes     Comment: occ   OB History   Grav Para Term Preterm Abortions TAB SAB Ect Mult Living   Review of Systems  Constitutional: Negative for fever.  Gastrointestinal: Positive for nausea and vomiting.  Neurological: Positive for headaches.  All other systems reviewed and are negative.     Allergies  Latex  Home Medications   Prior to Admission medications    Medication Sig Start Date End Date Taking? Authorizing Provider  ALPRAZolam Prudy Feeler) 1 MG tablet Take 1 tablet (1 mg total) by mouth once. May take 1/2 tablet first, wait half hour. 07/31/13   Tilda Burrow, MD  ALPRAZolam Prudy Feeler) 1 MG tablet Take 1 tablet (1 mg total) by mouth 3 (three) times daily as needed for anxiety. 08/21/13   Benny Lennert, MD  Aspirin-Salicylamide-Caffeine (BC HEADACHE POWDER PO) Take 1 Package by mouth daily as needed (for pain/headache.).     Historical Provider, MD  hydrOXYzine (ATARAX/VISTARIL) 25 MG tablet Take 1 tablet (25 mg total) by mouth every 6 (six) hours. 04/27/13   Sunnie Nielsen, MD  ibuprofen (ADVIL,MOTRIN) 800 MG tablet Take 1 tablet (800 mg total) by mouth 3 (three) times daily. 04/21/13   Charles B. Bernette Mayers, MD   BP 148/89  Pulse 106  Temp(Src) 98.5 F (36.9 C) (Oral)  Resp 18  Ht  (1.575 m)  Wt 168 lb (76.204 kg)  BMI 30.72 kg/m2  SpO2 98%  LMP 09/20/2013 Physical Exam  Constitutional: She is oriented to person, place, and time. She appears well-developed and well-nourished. No distress.  HENT:  Head: Normocephalic and atraumatic.  Right Ear: Hearing  normal.  Left Ear: Hearing normal.  Nose: Nose normal.  Mouth/Throat: Oropharynx is clear and moist and mucous membranes are normal.  Eyes: Conjunctivae and EOM are normal. Pupils are equal, round, and reactive to light.  Neck: Normal range of motion. Neck supple.  Cardiovascular: Regular rhythm, S1 normal and S2 normal.  Exam reveals no gallop and no friction rub.   No murmur heard. Pulmonary/Chest: Effort normal and breath sounds normal. No respiratory distress. She exhibits no tenderness.  Abdominal: Soft. Normal appearance and bowel sounds are normal. There is no hepatosplenomegaly. There is no tenderness. There is no rebound, no guarding, no tenderness at McBurney's point and negative Murphy's sign. No hernia.  Musculoskeletal: Normal range of motion.  Neurological: She is alert and  oriented to person, place, and time. She has normal strength. No cranial nerve deficit or sensory deficit. Coordination normal. GCS eye subscore is 4. GCS verbal subscore is 5. GCS motor subscore is 6.  Skin: Skin is warm, dry and intact. No rash noted. No cyanosis.  Psychiatric: She has a normal mood and affect. Her speech is normal and behavior is normal. Thought content normal.    ED Course  Procedures (including critical care time)  DIAGNOSTIC STUDIES:  Oxygen Saturation is 98% on RA, normal by my interpretation.    COORDINATION OF CARE:  1:21 PM Discussed treatment plan with pt at bedside and pt agreed to plan.  Labs Review Labs Reviewed - No data to display  Imaging Review No results found.   EKG Interpretation None      MDM   Final diagnoses:  None   migraine headache  Patient presents to the ER for evaluation of right-sided headache. Patient reports symptoms were present this morning upon awakening. She describes it as a sharp stabbing pain. Patient has had frequent headaches in the past, but has not had a workup for them. She calls them migraines, but has never had a CT scan according to her. Patient does not have any medications he is at home, has never seen neurology. She uses BC powder for the headaches, but it did not work today.  Recent neurologic exam is unremarkable. Her workup was entirely unremarkable including CT scan. Patient does not have any evidence of meningismus. I have low suspicion for subarachnoid hemorrhage with her presentation early in the onset of a headache and a negative CT. Patient will be treated with a migraine cocktail, discharge to home.   I personally performed the services described in this documentation, which was scribed in my presence. The recorded information has been reviewed and is accurate.       ChristophGilda Crease9/12/15 [phone]

## 2013-11-24 ENCOUNTER — Encounter (HOSPITAL_COMMUNITY): Payer: Self-pay | Admitting: Emergency Medicine

## 2013-12-04 ENCOUNTER — Encounter: Payer: Medicaid Other | Admitting: Adult Health

## 2013-12-05 ENCOUNTER — Ambulatory Visit (INDEPENDENT_AMBULATORY_CARE_PROVIDER_SITE_OTHER): Payer: Medicaid Other | Admitting: Adult Health

## 2013-12-05 ENCOUNTER — Encounter: Payer: Self-pay | Admitting: Adult Health

## 2013-12-05 VITALS — BP 138/80 | Ht 62.0 in | Wt 160.5 lb

## 2013-12-05 DIAGNOSIS — Z3202 Encounter for pregnancy test, result negative: Secondary | ICD-10-CM

## 2013-12-05 DIAGNOSIS — R110 Nausea: Secondary | ICD-10-CM

## 2013-12-05 LAB — POCT URINE PREGNANCY: PREG TEST UR: NEGATIVE

## 2013-12-05 NOTE — Patient Instructions (Signed)
Use condoms Return in 3 weeks for pap and physical

## 2013-12-05 NOTE — Progress Notes (Signed)
Subjective:     Patient ID: [name], female   DOB: April 03, 1985, 28 y.o.   MRN: [id number]  HPI [name] is a 28 year old black female in for UPT.  Review of Systems See HPI Reviewed past medical,surgical, social and family history. Reviewed medications and allergies.     Objective:   Physical Exam BP 138/80 mmHg  Ht 5\' 2"  (1.575 m)  Wt 160 lb 8 oz (72.802 kg)  BMI 29.35 kg/m2  LMP 11/09/2013   UPT negative, has changed shifts and feels nauseated, had +HPT last week  Assessment:     Nausea  Negative UPT    Plan:     Check QHCG Return in 3 weeks for pap and physical and talk birth control. Use condoms

## 2013-12-06 LAB — HCG, QUANTITATIVE, PREGNANCY

## 2013-12-16 ENCOUNTER — Telehealth: Payer: Self-pay | Admitting: Adult Health

## 2013-12-16 NOTE — Telephone Encounter (Signed)
Pt aware QHCG negative from last visit, has F/U appt

## 2013-12-16 NOTE — Telephone Encounter (Signed)
Left message I called 

## 2013-12-29 ENCOUNTER — Other Ambulatory Visit: Payer: Medicaid Other | Admitting: Adult Health

## 2014-01-13 ENCOUNTER — Encounter: Payer: Self-pay | Admitting: Adult Health

## 2014-01-13 ENCOUNTER — Ambulatory Visit (INDEPENDENT_AMBULATORY_CARE_PROVIDER_SITE_OTHER): Payer: Medicaid Other | Admitting: Adult Health

## 2014-01-13 ENCOUNTER — Other Ambulatory Visit (HOSPITAL_COMMUNITY)
Admission: RE | Admit: 2014-01-13 | Discharge: 2014-01-13 | Disposition: A | Discharge status: Home / Self Care | Payer: Medicaid Other | Source: Ambulatory Visit | Attending: Adult Health | Admitting: Adult Health

## 2014-01-13 VITALS — BP 140/78 | HR 76 | Ht 62.0 in | Wt 163.0 lb

## 2014-01-13 DIAGNOSIS — Z113 Encounter for screening for infections with a predominantly sexual mode of transmission: Secondary | ICD-10-CM | POA: Insufficient documentation

## 2014-01-13 DIAGNOSIS — Z8742 Personal history of other diseases of the female genital tract: Secondary | ICD-10-CM | POA: Insufficient documentation

## 2014-01-13 DIAGNOSIS — Z0000 Encounter for general adult medical examination without abnormal findings: Secondary | ICD-10-CM

## 2014-01-13 DIAGNOSIS — Z01411 Encounter for gynecological examination (general) (routine) with abnormal findings: Secondary | ICD-10-CM | POA: Insufficient documentation

## 2014-01-13 DIAGNOSIS — R809 Proteinuria, unspecified: Secondary | ICD-10-CM

## 2014-01-13 DIAGNOSIS — Z01419 Encounter for gynecological examination (general) (routine) without abnormal findings: Secondary | ICD-10-CM

## 2014-01-13 DIAGNOSIS — Z1389 Encounter for screening for other disorder: Secondary | ICD-10-CM

## 2014-01-13 DIAGNOSIS — Z3202 Encounter for pregnancy test, result negative: Secondary | ICD-10-CM

## 2014-01-13 DIAGNOSIS — N3941 Urge incontinence: Secondary | ICD-10-CM

## 2014-01-13 HISTORY — DX: Urge incontinence: N39.41

## 2014-01-13 HISTORY — DX: Personal history of other diseases of the female genital tract: Z87.42

## 2014-01-13 LAB — POCT URINALYSIS DIPSTICK
Glucose, UA: NEGATIVE
Leukocytes, UA: NEGATIVE
Nitrite, UA: NEGATIVE
RBC UA: NEGATIVE

## 2014-01-13 LAB — POCT URINE PREGNANCY: Preg Test, Ur: NEGATIVE

## 2014-01-13 NOTE — Progress Notes (Signed)
Patient ID: [name], female   DOB: 31-Oct-1985, 28 y.o.   MRN: [id number] History of Present Illness: [name] is a 28 year old black female in for pap and physical.She had a LGSIL pap 10/24/11.She is complaining of urge incontinence at times.She is using condoms at present and is not interested in birth control.Her son just turned 6.   Current Medications, Allergies, Past Medical History, Past Surgical History, Family History and Social History were reviewed in [name] electronic medical record.     Review of Systems: Patient denies any headaches, blurred vision, shortness of breath, chest pain, abdominal pain, problems with bowel movements, or intercourse. No joint pain, moods Ok on paxil, see HPI.    Physical Exam:BP 140/78 mmHg  Pulse 76  Ht 5\' 2"  (1.575 m)  Wt 163 lb (73.936 kg)  BMI 29.81 kg/m2  LMP 12/13/2015UPT negative, urine 2+ protein General:  Well developed, well nourished, no acute distress Skin:  Warm and dry Neck:  Midline trachea, normal thyroid Lungs; Clear to auscultation bilaterally Breast:  No dominant palpable mass, retraction, or nipple discharge Cardiovascular: Regular rate and rhythm Abdomen:  Soft, non tender, no hepatosplenomegaly Pelvic:  External genitalia is normal in appearance.  The vagina is normal in appearance.  The cervix is bulbous.Pap with GC/CHL performed.  Uterus is felt to be normal size, shape, and contour.  No                adnexal masses or tenderness noted. Extremities:  No swelling or varicosities noted Psych:  No mood changes,alert and cooperative,seems happy, will continue using condoms   Impression: Well woman gyn exam with pap UI Protein in urine History of abnormal pap    Plan: Physical in 1 year UA C&S Increase water Use condoms

## 2014-01-13 NOTE — Patient Instructions (Signed)
Physical in 1 year Increase water Will call when urine back Use condoms

## 2014-01-14 LAB — URINALYSIS
GLUCOSE, UA: NEGATIVE mg/dL
Hgb urine dipstick: NEGATIVE
Nitrite: NEGATIVE
PH: 6.5 (ref 5.0–8.0)
PROTEIN: NEGATIVE mg/dL
Specific Gravity, Urine: >1.03 — ABNORMAL HIGH (ref 1.005–1.030)
Urobilinogen, UA: 1 mg/dL (ref 0.0–1.0)

## 2014-01-15 LAB — URINE CULTURE: Colony Count: 35000

## 2014-01-15 LAB — CYTOLOGY - PAP

## 2014-01-21 ENCOUNTER — Telehealth: Payer: Self-pay | Admitting: Adult Health

## 2014-01-21 NOTE — Telephone Encounter (Signed)
Left message to call about pap,if she calls needs colpo 

## 2014-01-22 ENCOUNTER — Telehealth: Payer: Self-pay | Admitting: Adult Health

## 2014-01-22 NOTE — Telephone Encounter (Signed)
Pt aware pap abnormal will schedule colpo appt

## 2014-02-02 ENCOUNTER — Encounter: Payer: Self-pay | Admitting: Obstetrics & Gynecology

## 2014-02-02 ENCOUNTER — Ambulatory Visit (INDEPENDENT_AMBULATORY_CARE_PROVIDER_SITE_OTHER): Payer: Medicaid Other | Admitting: Obstetrics & Gynecology

## 2014-02-02 VITALS — BP 130/80 | Wt 169.0 lb

## 2014-02-02 DIAGNOSIS — Z3201 Encounter for pregnancy test, result positive: Secondary | ICD-10-CM

## 2014-02-02 DIAGNOSIS — IMO0002 Reserved for concepts with insufficient information to code with codable children: Secondary | ICD-10-CM

## 2014-02-02 LAB — POCT URINE PREGNANCY: Preg Test, Ur: POSITIVE

## 2014-02-02 NOTE — Progress Notes (Signed)
Patient ID: Kayla RainwaterQuinta L Nitschke, female   DOB: [birth year], 29 y.o.   MRN: [id number] Pt had LSIL on Pap However late  For her menses and UPT + Will delay colposcopy until 16 weeks or so  Follow up with her sono and initial prenatal in 2 weeks Patient's last menstrual period was 01/04/2014.

## 2014-02-16 ENCOUNTER — Other Ambulatory Visit: Payer: Self-pay | Admitting: Obstetrics & Gynecology

## 2014-02-16 DIAGNOSIS — O3680X0 Pregnancy with inconclusive fetal viability, not applicable or unspecified: Secondary | ICD-10-CM

## 2014-02-17 ENCOUNTER — Other Ambulatory Visit: Payer: Medicaid Other

## 2014-02-24 ENCOUNTER — Telehealth: Payer: Self-pay | Admitting: Adult Health

## 2014-02-24 ENCOUNTER — Other Ambulatory Visit: Payer: Self-pay | Admitting: Obstetrics & Gynecology

## 2014-02-24 ENCOUNTER — Ambulatory Visit (INDEPENDENT_AMBULATORY_CARE_PROVIDER_SITE_OTHER): Payer: Medicaid Other | Admitting: Women's Health

## 2014-02-24 ENCOUNTER — Encounter: Payer: Self-pay | Admitting: Women's Health

## 2014-02-24 ENCOUNTER — Ambulatory Visit (INDEPENDENT_AMBULATORY_CARE_PROVIDER_SITE_OTHER): Payer: Medicaid Other

## 2014-02-24 VITALS — BP 120/56 | Ht 62.0 in | Wt 173.0 lb

## 2014-02-24 DIAGNOSIS — O209 Hemorrhage in early pregnancy, unspecified: Secondary | ICD-10-CM

## 2014-02-24 DIAGNOSIS — O4691 Antepartum hemorrhage, unspecified, first trimester: Secondary | ICD-10-CM

## 2014-02-24 DIAGNOSIS — O3680X0 Pregnancy with inconclusive fetal viability, not applicable or unspecified: Secondary | ICD-10-CM

## 2014-02-24 DIAGNOSIS — O200 Threatened abortion: Secondary | ICD-10-CM

## 2014-02-24 NOTE — Progress Notes (Signed)
Patient ID: [name], female   DOB: 11/17/1985, 29 y.o.   MRN: [id number]   [facility]  Patient name: [name] MRN [id number]  Date of birth: 11/17/1985  CC & HPI:  [name] is a 29 y.o. African American [phone] female at 7wks by LMP presenting today for report of heavy bleeding and cramping that began yesterday. U/S today reveals no GS or IUP noted, bilateral adnexal w/o free fluid or masses.   Pertinent History Reviewed:  Medical & Surgical Hx:   Past Medical History  Diagnosis Date  . History of chlamydia   . History of bronchitis   . Abnormal pap 10/2011  . History of trichomoniasis   . Abortion   . Abnormal Pap smear   . Nausea and vomiting 11/18/2012  . Back pain 11/18/2012  . Anxiety   . History of gonorrhea   . Anxiety   . Urge incontinence 01/13/2014  . Vaginal Pap smear, abnormal   . History of abnormal cervical Pap smear 01/13/2014   Past Surgical History  Procedure Laterality Date  . Mouth surgery    . Elective abortion     Medications: Reviewed & Updated - see associated section Social History: Reviewed -  reports that she has been smoking Cigarettes.  She has a 5 pack-year smoking history. She has never used smokeless tobacco.  Objective Findings:  Vitals: BP 120/56 mmHg  Ht 5\' 2"  (1.575 m)  Wt 173 lb (78.472 kg)  BMI 31.63 kg/m2  LMP 01/04/2014  Physical Examination: General appearance - alert, well appearing, and in no distress  No results found for this or any previous visit (from the past 24 hour(s)).   Assessment & Plan:  A:   Probable miscarriage P:  Will get HCG, Group & Type today, repeat HCG in 2 days, will call her w/ results and f/u plan    [name], [name] CNM, [facility]NP-BC 02/24/2014 3:17 PM

## 2014-02-24 NOTE — Patient Instructions (Signed)
We are checking your pregnancy level and blood type today You are going to need to come back to [facility] on Thursday 2/4 for more blood work  Threatened Miscarriage A threatened miscarriage occurs when you have vaginal bleeding during your first 20 weeks of pregnancy but the pregnancy has not ended. If you have vaginal bleeding during this time, your health care provider will do tests to make sure you are still pregnant. If the tests show you are still pregnant and the developing baby (fetus) inside your womb (uterus) is still growing, your condition is considered a threatened miscarriage. A threatened miscarriage does not mean your pregnancy will end, but it does increase the risk of losing your pregnancy (complete miscarriage). CAUSES  The cause of a threatened miscarriage is usually not known. If you go on to have a complete miscarriage, the most common cause is an abnormal number of chromosomes in the developing baby. Chromosomes are the structures inside cells that hold all your genetic material. Some causes of vaginal bleeding that do not result in miscarriage include:  Having sex.  Having an infection.  Normal hormone changes of pregnancy.  Bleeding that occurs when an egg implants in your uterus. RISK FACTORS Risk factors for bleeding in early pregnancy include:  Obesity.  Smoking.  Drinking excessive amounts of alcohol or caffeine.  Recreational drug use. SIGNS AND SYMPTOMS  Light vaginal bleeding.  Mild abdominal pain or cramps. DIAGNOSIS  If you have bleeding with or without abdominal pain before 20 weeks of pregnancy, your health care provider will do tests to check whether you are still pregnant. One important test involves using sound waves and a computer (ultrasound) to create images of the inside of your uterus. Other tests include an internal exam of your vagina and uterus (pelvic exam) and measurement of your baby's heart rate.  You may be diagnosed with a  threatened miscarriage if:  Ultrasound testing shows you are still pregnant.  Your baby's heart rate is strong.  A pelvic exam shows that the opening between your uterus and your vagina (cervix) is closed.  Your heart rate and blood pressure are stable.  Blood tests confirm you are still pregnant. TREATMENT  No treatments have been shown to prevent a threatened miscarriage from going on to a complete miscarriage. However, the right home care is important.  HOME CARE INSTRUCTIONS   Make sure you keep all your appointments for prenatal care. This is very important.  Get plenty of rest.  Do not have sex or use tampons if you have vaginal bleeding.  Do not douche.  Do not smoke or use recreational drugs.  Do not drink alcohol.  Avoid caffeine. SEEK MEDICAL CARE IF:  You have light vaginal bleeding or spotting while pregnant.  You have abdominal pain or cramping.  You have a fever. SEEK IMMEDIATE MEDICAL CARE IF:  You have heavy vaginal bleeding.  You have blood clots coming from your vagina.  You have severe low back pain or abdominal cramps.  You have fever, chills, and severe abdominal pain. MAKE SURE YOU:  Understand these instructions.  Will watch your condition.  Will get help right away if you are not doing well or get worse. Document Released: 01/09/2005 Document Revised: 01/14/2013 Document Reviewed: 11/05/2012 [facility] Patient Information 2015 [employer] [place]. This information is not intended to replace advice given to you by your health care provider. Make sure you discuss any questions you have with your health care provider.

## 2014-02-24 NOTE — Telephone Encounter (Signed)
Spoke with pt. Pt missed her US appt. She is pregnant and she started bleeding heavy yest am. + backpain also. I advised she would need to be seen. Call transferred to front desk for appt. Pt to be seen at 2 pm for US and see provider afterwards. JSY

## 2014-02-25 LAB — ABO/RH: Rh Factor: POSITIVE

## 2014-02-25 LAB — HCG, QUANTITATIVE, PREGNANCY: hCG Quant: 104 m[IU]/mL

## 2014-02-26 ENCOUNTER — Telehealth: Payer: Self-pay | Admitting: Adult Health

## 2014-02-26 NOTE — Telephone Encounter (Signed)
Pt aware that blood type B+, QHCG 102, [facility] or in am and pt said she will

## 2014-03-13 ENCOUNTER — Telehealth: Payer: Self-pay | Admitting: Obstetrics & Gynecology

## 2014-03-13 MED ORDER — ONDANSETRON HCL 8 MG PO TABS: 8.0000 mg | ORAL_TABLET | Freq: Three times a day (TID) | ORAL | Status: DC | PRN

## 2014-03-13 NOTE — Telephone Encounter (Signed)
Pt states that she was supposed to come back in for blood work from a miscarriage 2 weeks ago. Pt states that she is still vomiting very bad.

## 2014-03-13 NOTE — Telephone Encounter (Signed)
I spoke with Dr. Despina HiddenEure and he gave a verbal order for Zofran 8 mg take one tablet po q 8 hours disp # 20 with no refills. Pt aware that Rx has been sent.

## 2014-03-18 ENCOUNTER — Ambulatory Visit: Payer: Medicaid Other | Admitting: Adult Health

## 2014-03-18 ENCOUNTER — Encounter: Payer: Self-pay | Admitting: Adult Health

## 2014-03-25 ENCOUNTER — Encounter: Payer: Self-pay | Admitting: Adult Health

## 2014-03-25 ENCOUNTER — Ambulatory Visit (INDEPENDENT_AMBULATORY_CARE_PROVIDER_SITE_OTHER): Payer: Medicaid Other | Admitting: Adult Health

## 2014-03-25 VITALS — BP 158/80 | HR 92 | Ht 62.0 in | Wt 178.5 lb

## 2014-03-25 DIAGNOSIS — O039 Complete or unspecified spontaneous abortion without complication: Secondary | ICD-10-CM

## 2014-03-25 DIAGNOSIS — Z3202 Encounter for pregnancy test, result negative: Secondary | ICD-10-CM | POA: Diagnosis not present

## 2014-03-25 LAB — POCT URINE PREGNANCY: Preg Test, Ur: NEGATIVE

## 2014-03-25 NOTE — Patient Instructions (Signed)
Return in 2 weeks for colpo Use condoms

## 2014-03-25 NOTE — Progress Notes (Signed)
Subjective:     Patient ID: Lynda RainwaterQuinta L Mcgeehan, female   DOB: 23-Mar-1985, 29 y.o.   MRN: [id number]  HPI Ernst BowlerQuinta is a 29 year old black female in sp miscarriage, she started bleeding 2/2 and bled for 7 days, none since, no pain but still feels pregnant.Blood type B+.Need QHCG.Had abnormal and needs colpo.  Review of Systems  Patient denies any headaches, hearing loss, fatigue, blurred vision, shortness of breath, chest pain, abdominal pain, problems with bowel movements, urination, or intercourse. No joint pain or mood swings.See HPI. Reviewed past medical,surgical, social and family history. Reviewed medications and allergies.     Objective:   Physical Exam BP 158/80 mmHg  Pulse 92  Ht 5\' 2"  (1.575 m)  Wt 178 lb 8 oz (80.967 kg)  BMI 32.64 kg/m2  LMP 02/24/2014  Breastfeeding? UnknownUPT negative, skin warm and dry, abdomen soft, non tender, no HSM.[facility].    Assessment:     Miscarriage     Plan:     Check QHCG Return in 2 weeks for colpo with Dr Despina HiddenEure Use condoms

## 2014-03-26 LAB — HCG, QUANTITATIVE, PREGNANCY

## 2014-04-08 ENCOUNTER — Encounter: Payer: Medicaid Other | Admitting: Obstetrics & Gynecology

## 2014-04-08 ENCOUNTER — Encounter: Payer: Self-pay | Admitting: Obstetrics & Gynecology

## 2014-04-14 ENCOUNTER — Encounter: Payer: Medicaid Other | Admitting: Obstetrics & Gynecology

## 2014-04-21 ENCOUNTER — Encounter: Payer: Medicaid Other | Admitting: Obstetrics & Gynecology

## 2014-04-30 ENCOUNTER — Encounter: Payer: Medicaid Other | Admitting: Obstetrics & Gynecology

## 2014-05-11 ENCOUNTER — Encounter: Payer: Medicaid Other | Admitting: Obstetrics & Gynecology

## 2014-06-29 DIAGNOSIS — Z029 Encounter for administrative examinations, unspecified: Secondary | ICD-10-CM

## 2014-07-22 ENCOUNTER — Ambulatory Visit (INDEPENDENT_AMBULATORY_CARE_PROVIDER_SITE_OTHER): Payer: Medicaid Other | Admitting: Obstetrics & Gynecology

## 2014-07-22 ENCOUNTER — Encounter: Payer: Self-pay | Admitting: Obstetrics & Gynecology

## 2014-07-22 VITALS — BP 110/70 | HR 76 | Wt 187.0 lb

## 2014-07-22 DIAGNOSIS — A499 Bacterial infection, unspecified: Secondary | ICD-10-CM | POA: Diagnosis not present

## 2014-07-22 DIAGNOSIS — N760 Acute vaginitis: Secondary | ICD-10-CM | POA: Diagnosis not present

## 2014-07-22 DIAGNOSIS — B9689 Other specified bacterial agents as the cause of diseases classified elsewhere: Secondary | ICD-10-CM

## 2014-07-22 MED ORDER — METRONIDAZOLE 500 MG PO TABS: 500.0000 mg | ORAL_TABLET | Freq: Two times a day (BID) | ORAL | Status: DC

## 2014-07-22 NOTE — Progress Notes (Signed)
Patient ID: Kayla RainwaterQuinta L Radermacher, female   DOB: 11/18/1985, 29 y.o.   MRN: [id number] Discharge 2 weeks  Bleeding now  -CMT pelvic neg  Treat empirically for BV/trich with metronidazole 500 mg BID     Face to face time:  10 minutes  Greater than 50% of the visit time was spent in counseling and coordination of care with the patient.  The summary and outline of the counseling and care coordination is summarized in the note above.   All questions were answered.

## 2014-10-07 ENCOUNTER — Encounter: Payer: Self-pay | Admitting: *Deleted

## 2014-10-07 ENCOUNTER — Ambulatory Visit: Payer: Medicaid Other | Admitting: Adult Health

## 2014-12-16 ENCOUNTER — Encounter: Payer: Self-pay | Admitting: Obstetrics & Gynecology

## 2014-12-16 ENCOUNTER — Ambulatory Visit (INDEPENDENT_AMBULATORY_CARE_PROVIDER_SITE_OTHER): Payer: Medicaid Other | Admitting: Obstetrics & Gynecology

## 2014-12-16 VITALS — BP 120/80 | HR 76 | Wt 183.0 lb

## 2014-12-16 DIAGNOSIS — N898 Other specified noninflammatory disorders of vagina: Secondary | ICD-10-CM

## 2014-12-16 DIAGNOSIS — Z113 Encounter for screening for infections with a predominantly sexual mode of transmission: Secondary | ICD-10-CM

## 2014-12-16 DIAGNOSIS — Z3202 Encounter for pregnancy test, result negative: Secondary | ICD-10-CM

## 2014-12-16 LAB — POCT URINE PREGNANCY: Preg Test, Ur: NEGATIVE

## 2014-12-16 NOTE — Progress Notes (Signed)
Patient ID: Lynda RainwaterQuinta L Avitia, female   DOB: 19-Dec-1985, 29 y.o.   MRN: [id number]        Chief Complaint  Patient presents with  . gyn visit    vaginal discharge.    Blood pressure 120/80, pulse 76, weight 183 lb (83.008 kg), last menstrual period 11/16/2014, unknown if currently breastfeeding.  29 y.o. G3P1011 Patient's last menstrual period was 11/16/2014. The current method of family planning is .  Subjective Discharge no itching some irritation no odor  Objective Wet prep negative  Pertinent ROS   Labs or studies     Impression Diagnoses this Encounter::   ICD-9-CM ICD-10-CM   1. Vaginal discharge 623.5 N89.8   2. Negative pregnancy test V72.41 Z32.02 POCT urine pregnancy    Established relevant diagnosis(es):   Plan/Recommendations: No orders of the defined types were placed in this encounter.     Labs or Scans Ordered: Orders Placed This Encounter  Procedures  . POCT urine pregnancy      Follow up prn       Face to face time:  10 minutes  Greater than 50% of the visit time was spent in counseling and coordination of care with the patient.  The summary and outline of the counseling and care coordination is summarized in the note above.   All questions were answered.

## 2014-12-18 LAB — GC/CHLAMYDIA PROBE AMP
Chlamydia trachomatis, NAA: NEGATIVE
NEISSERIA GONORRHOEAE BY PCR: NEGATIVE

## 2015-05-03 ENCOUNTER — Other Ambulatory Visit: Payer: Medicaid Other | Admitting: Adult Health

## 2015-06-07 ENCOUNTER — Ambulatory Visit: Payer: Medicaid Other | Admitting: Adult Health

## 2015-06-08 ENCOUNTER — Ambulatory Visit: Payer: Medicaid Other | Admitting: Adult Health

## 2015-06-09 ENCOUNTER — Ambulatory Visit: Payer: Medicaid Other | Admitting: Adult Health

## 2015-06-09 ENCOUNTER — Encounter: Payer: Self-pay | Admitting: Adult Health

## 2015-06-11 ENCOUNTER — Encounter: Payer: Self-pay | Admitting: Adult Health

## 2015-06-11 ENCOUNTER — Ambulatory Visit: Payer: Medicaid Other | Admitting: Adult Health

## 2015-06-11 ENCOUNTER — Ambulatory Visit (INDEPENDENT_AMBULATORY_CARE_PROVIDER_SITE_OTHER): Payer: Medicaid Other | Admitting: Adult Health

## 2015-06-11 VITALS — BP 138/70 | HR 80 | Ht 62.0 in | Wt 192.0 lb

## 2015-06-11 DIAGNOSIS — R112 Nausea with vomiting, unspecified: Secondary | ICD-10-CM | POA: Diagnosis not present

## 2015-06-11 DIAGNOSIS — O219 Vomiting of pregnancy, unspecified: Secondary | ICD-10-CM

## 2015-06-11 DIAGNOSIS — O3680X0 Pregnancy with inconclusive fetal viability, not applicable or unspecified: Secondary | ICD-10-CM

## 2015-06-11 DIAGNOSIS — Z3490 Encounter for supervision of normal pregnancy, unspecified, unspecified trimester: Secondary | ICD-10-CM

## 2015-06-11 DIAGNOSIS — Z3201 Encounter for pregnancy test, result positive: Secondary | ICD-10-CM | POA: Diagnosis not present

## 2015-06-11 DIAGNOSIS — R110 Nausea: Secondary | ICD-10-CM | POA: Insufficient documentation

## 2015-06-11 HISTORY — DX: Encounter for supervision of normal pregnancy, unspecified, unspecified trimester: Z34.90

## 2015-06-11 HISTORY — DX: Vomiting of pregnancy, unspecified: O21.9

## 2015-06-11 LAB — POCT URINE PREGNANCY: PREG TEST UR: POSITIVE — AB

## 2015-06-11 MED ORDER — PRENATAL PLUS 27-1 MG PO TABS: 1.0000 | ORAL_TABLET | Freq: Every day | ORAL | Status: DC

## 2015-06-11 MED ORDER — DOXYLAMINE-PYRIDOXINE 10-10 MG PO TBEC: DELAYED_RELEASE_TABLET | ORAL | Status: DC

## 2015-06-11 NOTE — Patient Instructions (Signed)
First Trimester of Pregnancy The first trimester of pregnancy is from week 1 until the end of week 12 (months 1 through 3). A week after a sperm fertilizes an egg, the egg will implant on the wall of the uterus. This embryo will begin to develop into a baby. Genes from you and your partner are forming the baby. The female genes determine whether the baby is a boy or a girl. At 6-8 weeks, the eyes and face are formed, and the heartbeat can be seen on ultrasound. At the end of 12 weeks, all the baby's organs are formed.  Now that you are pregnant, you will want to do everything you can to have a healthy baby. Two of the most important things are to get good prenatal care and to follow your health care provider's instructions. Prenatal care is all the medical care you receive before the baby's birth. This care will help prevent, find, and treat any problems during the pregnancy and childbirth. BODY CHANGES Your body goes through many changes during pregnancy. The changes vary from woman to woman.   You may gain or lose a couple of pounds at first.  You may feel sick to your stomach (nauseous) and throw up (vomit). If the vomiting is uncontrollable, call your health care provider.  You may tire easily.  You may develop headaches that can be relieved by medicines approved by your health care provider.  You may urinate more often. Painful urination may mean you have a bladder infection.  You may develop heartburn as a result of your pregnancy.  You may develop constipation because certain hormones are causing the muscles that push waste through your intestines to slow down.  You may develop hemorrhoids or swollen, bulging veins (varicose veins).  Your breasts may begin to grow larger and become tender. Your nipples may stick out more, and the tissue that surrounds them (areola) may become darker.  Your gums may bleed and may be sensitive to brushing and flossing.  Dark spots or blotches (chloasma,  mask of pregnancy) may develop on your face. This will likely fade after the baby is born.  Your menstrual periods will stop.  You may have a loss of appetite.  You may develop cravings for certain kinds of food.  You may have changes in your emotions from day to day, such as being excited to be pregnant or being concerned that something may go wrong with the pregnancy and baby.  You may have more vivid and strange dreams.  You may have changes in your hair. These can include thickening of your hair, rapid growth, and changes in texture. Some women also have hair loss during or after pregnancy, or hair that feels dry or thin. Your hair will most likely return to normal after your baby is born. WHAT TO EXPECT AT YOUR PRENATAL VISITS During a routine prenatal visit:  You will be weighed to make sure you and the baby are growing normally.  Your blood pressure will be taken.  Your abdomen will be measured to track your baby's growth.  The fetal heartbeat will be listened to starting around week 10 or 12 of your pregnancy.  Test results from any previous visits will be discussed. Your health care provider may ask you:  How you are feeling.  If you are feeling the baby move.  If you have had any abnormal symptoms, such as leaking fluid, bleeding, severe headaches, or abdominal cramping.  If you are using any tobacco products,   including cigarettes, chewing tobacco, and electronic cigarettes.  If you have any questions. Other tests that may be performed during your first trimester include:  Blood tests to find your blood type and to check for the presence of any previous infections. They will also be used to check for low iron levels (anemia) and Rh antibodies. Later in the pregnancy, blood tests for diabetes will be done along with other tests if problems develop.  Urine tests to check for infections, diabetes, or protein in the urine.  An ultrasound to confirm the proper growth  and development of the baby.  An amniocentesis to check for possible genetic problems.  Fetal screens for spina bifida and Down syndrome.  You may need other tests to make sure you and the baby are doing well.  HIV (human immunodeficiency virus) testing. Routine prenatal testing includes screening for HIV, unless you choose not to have this test. HOME CARE INSTRUCTIONS  Medicines  Follow your health care provider's instructions regarding medicine use. Specific medicines may be either safe or unsafe to take during pregnancy.  Take your prenatal vitamins as directed.  If you develop constipation, try taking a stool softener if your health care provider approves. Diet  Eat regular, well-balanced meals. Choose a variety of foods, such as meat or vegetable-based protein, fish, milk and low-fat dairy products, vegetables, fruits, and whole grain breads and cereals. Your health care provider will help you determine the amount of weight gain that is right for you.  Avoid raw meat and uncooked cheese. These carry germs that can cause birth defects in the baby.  Eating four or five small meals rather than three large meals a day may help relieve nausea and vomiting. If you start to feel nauseous, eating a few soda crackers can be helpful. Drinking liquids between meals instead of during meals also seems to help nausea and vomiting.  If you develop constipation, eat more high-fiber foods, such as fresh vegetables or fruit and whole grains. Drink enough fluids to keep your urine clear or pale yellow. Activity and Exercise  Exercise only as directed by your health care provider. Exercising will help you:  Control your weight.  Stay in shape.  Be prepared for labor and delivery.  Experiencing pain or cramping in the lower abdomen or low back is a good sign that you should stop exercising. Check with your health care provider before continuing normal exercises.  Try to avoid standing for long  periods of time. Move your legs often if you must stand in one place for a long time.  Avoid heavy lifting.  Wear low-heeled shoes, and practice good posture.  You may continue to have sex unless your health care provider directs you otherwise. Relief of Pain or Discomfort  Wear a good support bra for breast tenderness.   Take warm sitz baths to soothe any pain or discomfort caused by hemorrhoids. Use hemorrhoid cream if your health care provider approves.   Rest with your legs elevated if you have leg cramps or low back pain.  If you develop varicose veins in your legs, wear support hose. Elevate your feet for 15 minutes, 3-4 times a day. Limit salt in your diet. Prenatal Care  Schedule your prenatal visits by the twelfth week of pregnancy. They are usually scheduled monthly at first, then more often in the last 2 months before delivery.  Write down your questions. Take them to your prenatal visits.  Keep all your prenatal visits as directed by your   health care provider. Safety  Wear your seat belt at all times when driving.  Make a list of emergency phone numbers, including numbers for family, friends, the hospital, and police and fire departments. General Tips  Ask your health care provider for a referral to a local prenatal education class. Begin classes no later than at the beginning of month 6 of your pregnancy.  Ask for help if you have counseling or nutritional needs during pregnancy. Your health care provider can offer advice or refer you to specialists for help with various needs.  Do not use hot tubs, steam rooms, or saunas.  Do not douche or use tampons or scented sanitary pads.  Do not cross your legs for long periods of time.  Avoid cat litter boxes and soil used by cats. These carry germs that can cause birth defects in the baby and possibly loss of the fetus by miscarriage or stillbirth.  Avoid all smoking, herbs, alcohol, and medicines not prescribed by  your health care provider. Chemicals in these affect the formation and growth of the baby.  Do not use any tobacco products, including cigarettes, chewing tobacco, and electronic cigarettes. If you need help quitting, ask your health care provider. You may receive counseling support and other resources to help you quit.  Schedule a dentist appointment. At home, brush your teeth with a soft toothbrush and be gentle when you floss. SEEK MEDICAL CARE IF:   You have dizziness.  You have mild pelvic cramps, pelvic pressure, or nagging pain in the abdominal area.  You have persistent nausea, vomiting, or diarrhea.  You have a bad smelling vaginal discharge.  You have pain with urination.  You notice increased swelling in your face, hands, legs, or ankles. SEEK IMMEDIATE MEDICAL CARE IF:   You have a fever.  You are leaking fluid from your vagina.  You have spotting or bleeding from your vagina.  You have severe abdominal cramping or pain.  You have rapid weight gain or loss.  You vomit blood or material that looks like coffee grounds.  You are exposed to German measles and have never had them.  You are exposed to fifth disease or chickenpox.  You develop a severe headache.  You have shortness of breath.  You have any kind of trauma, such as from a fall or a car accident.   This information is not intended to replace advice given to you by your health care provider. Make sure you discuss any questions you have with your health care provider.   Document Released: 01/03/2001 Document Revised: 01/30/2014 Document Reviewed: 11/19/2012 Elsevier Interactive Patient Education 2016 [employer] Return in 1 week for dating US  

## 2015-06-11 NOTE — Progress Notes (Signed)
Subjective:     Patient ID: [name] [name], female   DOB: January 08, 1986, 30 y.o.   MRN: [id number]  HPI [name] is a 30 year old black female in for a UPT, has had 1+ HPT and has nausea and vomiting, denies any bleeding or pain, has anxiety.   Review of Systems Patient denies any headaches, hearing loss, fatigue, blurred vision, shortness of breath, chest pain, abdominal pain, problems with bowel movements, urination, or intercourse. No joint pain or mood swings.See HPI for positives. Reviewed past medical,surgical, social and family history. Reviewed medications and allergies.     Objective:   Physical Exam BP 138/70 mmHg  Pulse 80  Ht 5\' 2"  (1.575 m)  Wt 192 lb (87.091 kg)  BMI 35.11 kg/m2  LMP 04/30/2015 UPT + about 6 weeks by LMP with EDD 02/04/16, Skin warm and dry. Neck: mid line trachea, normal thyroid, good ROM, no lymphadenopathy noted. Lungs: clear to ausculation bilaterally. Cardiovascular: regular rate and rhythm. Abdomen soft and non tender    Assessment:     UPT + Pregnant Nausea and vomiting    Plan:     Rx prenatal plus #30 take 1 daily with 11 refills Rx diclegis #180 with 1 refill, take as directed Return in 1 week for dating US Review handout on first trimester Eat often Ok to take vistaril if needed for anxiety

## 2015-06-14 ENCOUNTER — Ambulatory Visit: Payer: Medicaid Other | Admitting: Adult Health

## 2015-06-16 ENCOUNTER — Ambulatory Visit (INDEPENDENT_AMBULATORY_CARE_PROVIDER_SITE_OTHER): Payer: Medicaid Other

## 2015-06-16 ENCOUNTER — Other Ambulatory Visit: Payer: Self-pay | Admitting: Adult Health

## 2015-06-16 DIAGNOSIS — O3680X0 Pregnancy with inconclusive fetal viability, not applicable or unspecified: Secondary | ICD-10-CM

## 2015-06-16 DIAGNOSIS — Z3A01 Less than 8 weeks gestation of pregnancy: Secondary | ICD-10-CM | POA: Diagnosis not present

## 2015-06-16 NOTE — Progress Notes (Signed)
Dating US today for uncertain LMP. Single fetus with FHR 112 bpm. CRL measures 5.0 mm which correlates with GA 6+1 weeks and EDD 02/08/16. Bilateral ovaries appear normal with CLC on the right.

## 2015-06-30 ENCOUNTER — Encounter: Payer: Self-pay | Admitting: Women's Health

## 2015-06-30 ENCOUNTER — Other Ambulatory Visit (HOSPITAL_COMMUNITY)
Admission: RE | Admit: 2015-06-30 | Discharge: 2015-06-30 | Disposition: A | Discharge status: Home / Self Care | Payer: Medicaid Other | Source: Ambulatory Visit | Attending: Obstetrics & Gynecology | Admitting: Obstetrics & Gynecology

## 2015-06-30 ENCOUNTER — Ambulatory Visit (INDEPENDENT_AMBULATORY_CARE_PROVIDER_SITE_OTHER): Payer: Medicaid Other | Admitting: Women's Health

## 2015-06-30 VITALS — BP 120/68 | HR 80 | Wt 192.0 lb

## 2015-06-30 DIAGNOSIS — Z3A09 9 weeks gestation of pregnancy: Secondary | ICD-10-CM | POA: Diagnosis not present

## 2015-06-30 DIAGNOSIS — O210 Mild hyperemesis gravidarum: Secondary | ICD-10-CM

## 2015-06-30 DIAGNOSIS — Z3491 Encounter for supervision of normal pregnancy, unspecified, first trimester: Secondary | ICD-10-CM

## 2015-06-30 DIAGNOSIS — Z3682 Encounter for antenatal screening for nuchal translucency: Secondary | ICD-10-CM

## 2015-06-30 DIAGNOSIS — O99341 Other mental disorders complicating pregnancy, first trimester: Secondary | ICD-10-CM

## 2015-06-30 DIAGNOSIS — O99331 Smoking (tobacco) complicating pregnancy, first trimester: Secondary | ICD-10-CM | POA: Diagnosis not present

## 2015-06-30 DIAGNOSIS — Z113 Encounter for screening for infections with a predominantly sexual mode of transmission: Secondary | ICD-10-CM | POA: Insufficient documentation

## 2015-06-30 DIAGNOSIS — Z369 Encounter for antenatal screening, unspecified: Secondary | ICD-10-CM

## 2015-06-30 DIAGNOSIS — Z124 Encounter for screening for malignant neoplasm of cervix: Secondary | ICD-10-CM

## 2015-06-30 DIAGNOSIS — Z1389 Encounter for screening for other disorder: Secondary | ICD-10-CM

## 2015-06-30 DIAGNOSIS — Z01411 Encounter for gynecological examination (general) (routine) with abnormal findings: Secondary | ICD-10-CM | POA: Insufficient documentation

## 2015-06-30 DIAGNOSIS — Z331 Pregnant state, incidental: Secondary | ICD-10-CM | POA: Diagnosis not present

## 2015-06-30 DIAGNOSIS — Z3481 Encounter for supervision of other normal pregnancy, first trimester: Secondary | ICD-10-CM

## 2015-06-30 DIAGNOSIS — Z3490 Encounter for supervision of normal pregnancy, unspecified, unspecified trimester: Secondary | ICD-10-CM | POA: Insufficient documentation

## 2015-06-30 DIAGNOSIS — Z0283 Encounter for blood-alcohol and blood-drug test: Secondary | ICD-10-CM

## 2015-06-30 DIAGNOSIS — F17200 Nicotine dependence, unspecified, uncomplicated: Secondary | ICD-10-CM

## 2015-06-30 DIAGNOSIS — F419 Anxiety disorder, unspecified: Secondary | ICD-10-CM

## 2015-06-30 LAB — POCT URINALYSIS DIPSTICK
GLUCOSE UA: NEGATIVE
Ketones, UA: NEGATIVE
Leukocytes, UA: NEGATIVE
Nitrite, UA: NEGATIVE
RBC UA: NEGATIVE

## 2015-06-30 MED ORDER — ESCITALOPRAM OXALATE 10 MG PO TABS: 10.0000 mg | ORAL_TABLET | Freq: Every day | ORAL | Status: DC

## 2015-06-30 MED ORDER — PANTOPRAZOLE SODIUM 20 MG PO TBEC: 20.0000 mg | DELAYED_RELEASE_TABLET | Freq: Every day | ORAL | Status: DC

## 2015-06-30 NOTE — Progress Notes (Addendum)
Subjective:  [name] is a 30 y.o. [id number] African American female at 4476w1d by 7wk u/s, being seen today for her first obstetrical visit.  Her obstetrical history is significant for (1) term uncomplicated SVB x 1, SAB x 2, EAB x 1, (2) smoker: 1/2ppd prior to pregnancy, now 5/day-no real desire to quit.  Pregnancy history fully reviewed.  Patient reports anxiety- on vistaril rx'd by Hyman Bowerlara Gunn x 2 years; bad heartburn- doesn't like tums- wants rx. Reports some 'depression/stress', denies SI/HI. Nausea- already has diclegis rx. Denies vb, cramping, uti s/s, abnormal/malodorous vag d/c, or vulvovaginal itching/irritation.  BP 120/68 mmHg  Pulse 80  Wt 192 lb (87.091 kg)  LMP 04/30/2015 (Approximate)  HISTORY: OB History  Gravida Para Term Preterm AB SAB TAB Ectopic Multiple Living  5 1 1  3 2 1   1     # Outcome Date GA Lbr Len/2nd Weight Sex Delivery Anes PTL Lv  5 Current           4 Term 01/11/08 3875w5d  7 lb 10 oz (3.459 kg) M Vag-Spont EPI N Y  3 SAB           2 SAB           1 TAB              Past Medical History  Diagnosis Date  . History of chlamydia   . History of bronchitis   . Abnormal pap 10/2011  . History of trichomoniasis   . Abortion   . Abnormal Pap smear   . Nausea and vomiting 11/18/2012  . Back pain 11/18/2012  . Anxiety   . History of gonorrhea   . Anxiety   . Urge incontinence 01/13/2014  . Vaginal Pap smear, abnormal   . History of abnormal cervical Pap smear 01/13/2014  . Pregnant 06/11/2015  . Nausea and vomiting during pregnancy 06/11/2015   Past Surgical History  Procedure Laterality Date  . Mouth surgery    . Elective abortion     Family History  Problem Relation Age of Onset  . Cancer Maternal Aunt     breast  . Diabetes Maternal Grandmother   . Hypertension Maternal Grandmother   . Hypertension Maternal Grandfather   . Schizophrenia Brother   . Seizures Brother   . Asthma Son   . Fibroids Mother     fibroid tumor in uterus     Exam   System:     General: Well developed & nourished, no acute distress   Skin: Warm & dry, normal coloration and turgor, no rashes   Neurologic: Alert & oriented, normal mood   Cardiovascular: Regular rate & rhythm   Respiratory: Effort & rate normal, LCTAB, acyanotic   Abdomen: Soft, non tender   Extremities: normal strength, tone   Pelvic Exam:    Perineum: Normal perineum   Vulva: Normal, no lesions   Vagina:  Normal mucosa, normal discharge   Cervix: Normal, bulbous, appears closed   Uterus: Normal size/shape/contour for GA   Thin prep pap smear obtained w/ reflex high risk HPV cotesting   Assessment:   Pregnancy: [id number] Patient Active Problem List   Diagnosis Date Noted  . Supervision of normal pregnancy 06/30/2015    Priority: High  . Smoker 06/30/2015  . Nausea and vomiting during pregnancy 06/11/2015  . Urge incontinence 01/13/2014  . History of abnormal cervical Pap smear 01/13/2014  . Gonorrhea 08/01/2013  . BV (bacterial vaginosis) 07/31/2013  . PTSD (post-traumatic  stress disorder) 07/31/2013  . Nausea and vomiting 11/18/2012  . Back pain 11/18/2012  . History of chlamydia 05/27/2012  . History of bronchitis 05/27/2012  . History of trichomoniasis 05/27/2012    [redacted]w[redacted]d G5P1031 New OB visit Heartburn Anxiety Smoker  Plan:  Initial labs drawn Continue prenatal vitamins Problem list reviewed and updated Reviewed n/v relief measures and warning s/s to report Rx protonix  daily for heartburn, gave printed prevention/relief measures To stop vistaril, contraindicated in 1st trimester pregnancy per UTD Rx Lexapro  daily for anxiety/mild depression- understands can take a few weeks to notice improvement Discussed counseling- states she had counselor at [facility] in past she didn't like- would be willing to try somewhere different- [facility] referral faxed Reviewed recommended weight gain based on pre-gravid BMI Encouraged  well-balanced diet Genetic Screening discussed Integrated Screen: requested Cystic fibrosis screening discussed declined Ultrasound discussed; fetal survey: requested Follow up in 4 weeks for 1st IT/NT and visit [facility] completed Smokes 5 cigarettes/day, advised cessation, discussed risks to fetus while pregnant, to infant pp, and to herself. Offered QuitlineNC, declined  Marge Duncans CNM, [facility] 06/30/2015 3:34 PM

## 2015-06-30 NOTE — Patient Instructions (Addendum)
Nausea & Vomiting  Have saltine crackers or pretzels by your bed and eat a few bites before you raise your head out of bed in the morning  Eat small frequent meals throughout the day instead of large meals  Drink plenty of fluids throughout the day to stay hydrated, just don't drink a lot of fluids with your meals.  This can make your stomach fill up faster making you feel sick  Do not brush your teeth right after you eat  Products with real ginger are good for nausea, like ginger ale and ginger hard candy Make sure it says made with real ginger!  Sucking on sour candy like lemon heads is also good for nausea  If your prenatal vitamins make you nauseated, take them at night so you will sleep through the nausea  Sea Bands  If you feel like you need medicine for the nausea & vomiting please let us know  If you are unable to keep any fluids or food down please let us know   Constipation  Drink plenty of fluid, preferably water, throughout the day  Eat foods high in fiber such as fruits, vegetables, and grains  Exercise, such as walking, is a good way to keep your bowels regular  Drink warm fluids, especially warm prune juice, or decaf coffee  Eat a 1/2 cup of real oatmeal (not instant), 1/2 cup applesauce, and 1/2-1 cup warm prune juice every day  If needed, you may take Colace (docusate sodium) stool softener once or twice a day to help keep the stool soft. If you are pregnant, wait until you are out of your first trimester (12-14 weeks of pregnancy)  If you still are having problems with constipation, you may take Miralax once daily as needed to help keep your bowels regular.  If you are pregnant, wait until you are out of your first trimester (12-14 weeks of pregnancy)   First Trimester of Pregnancy The first trimester of pregnancy is from week 1 until the end of week 12 (months 1 through 3). A week after a sperm fertilizes an egg, the egg will implant on the wall of the  uterus. This embryo will begin to develop into a baby. Genes from you and your partner are forming the baby. The female genes determine whether the baby is a boy or a girl. At 6-8 weeks, the eyes and face are formed, and the heartbeat can be seen on ultrasound. At the end of 12 weeks, all the baby's organs are formed.  Now that you are pregnant, you will want to do everything you can to have a healthy baby. Two of the most important things are to get good prenatal care and to follow your health care provider's instructions. Prenatal care is all the medical care you receive before the baby's birth. This care will help prevent, find, and treat any problems during the pregnancy and childbirth. BODY CHANGES Your body goes through many changes during pregnancy. The changes vary from woman to woman.   You may gain or lose a couple of pounds at first.  You may feel sick to your stomach (nauseous) and throw up (vomit). If the vomiting is uncontrollable, call your health care provider.  You may tire easily.  You may develop headaches that can be relieved by medicines approved by your health care provider.  You may urinate more often. Painful urination may mean you have a bladder infection.  You may develop heartburn as a result of your pregnancy.    You may develop constipation because certain hormones are causing the muscles that push waste through your intestines to slow down.  You may develop hemorrhoids or swollen, bulging veins (varicose veins).  Your breasts may begin to grow larger and become tender. Your nipples may stick out more, and the tissue that surrounds them (areola) may become darker.  Your gums may bleed and may be sensitive to brushing and flossing.  Dark spots or blotches (chloasma, mask of pregnancy) may develop on your face. This will likely fade after the baby is born.  Your menstrual periods will stop.  You may have a loss of appetite.  You may develop cravings for certain  kinds of food.  You may have changes in your emotions from day to day, such as being excited to be pregnant or being concerned that something may go wrong with the pregnancy and baby.  You may have more vivid and strange dreams.  You may have changes in your hair. These can include thickening of your hair, rapid growth, and changes in texture. Some women also have hair loss during or after pregnancy, or hair that feels dry or thin. Your hair will most likely return to normal after your baby is born. WHAT TO EXPECT AT YOUR PRENATAL VISITS During a routine prenatal visit:  You will be weighed to make sure you and the baby are growing normally.  Your blood pressure will be taken.  Your abdomen will be measured to track your baby's growth.  The fetal heartbeat will be listened to starting around week 10 or 12 of your pregnancy.  Test results from any previous visits will be discussed. Your health care provider may ask you:  How you are feeling.  If you are feeling the baby move.  If you have had any abnormal symptoms, such as leaking fluid, bleeding, severe headaches, or abdominal cramping.  If you have any questions. Other tests that may be performed during your first trimester include:  Blood tests to find your blood type and to check for the presence of any previous infections. They will also be used to check for low iron levels (anemia) and Rh antibodies. Later in the pregnancy, blood tests for diabetes will be done along with other tests if problems develop.  Urine tests to check for infections, diabetes, or protein in the urine.  An ultrasound to confirm the proper growth and development of the baby.  An amniocentesis to check for possible genetic problems.  Fetal screens for spina bifida and Down syndrome.  You may need other tests to make sure you and the baby are doing well. HOME CARE INSTRUCTIONS  Medicines  Follow your health care provider's instructions regarding  medicine use. Specific medicines may be either safe or unsafe to take during pregnancy.  Take your prenatal vitamins as directed.  If you develop constipation, try taking a stool softener if your health care provider approves. Diet  Eat regular, well-balanced meals. Choose a variety of foods, such as meat or vegetable-based protein, fish, milk and low-fat dairy products, vegetables, fruits, and whole grain breads and cereals. Your health care provider will help you determine the amount of weight gain that is right for you.  Avoid raw meat and uncooked cheese. These carry germs that can cause birth defects in the baby.  Eating four or five small meals rather than three large meals a day may help relieve nausea and vomiting. If you start to feel nauseous, eating a few soda crackers can be   helpful. Drinking liquids between meals instead of during meals also seems to help nausea and vomiting.  If you develop constipation, eat more high-fiber foods, such as fresh vegetables or fruit and whole grains. Drink enough fluids to keep your urine clear or pale yellow. Activity and Exercise  Exercise only as directed by your health care provider. Exercising will help you:  Control your weight.  Stay in shape.  Be prepared for labor and delivery.  Experiencing pain or cramping in the lower abdomen or low back is a good sign that you should stop exercising. Check with your health care provider before continuing normal exercises.  Try to avoid standing for long periods of time. Move your legs often if you must stand in one place for a long time.  Avoid heavy lifting.  Wear low-heeled shoes, and practice good posture.  You may continue to have sex unless your health care provider directs you otherwise. Relief of Pain or Discomfort  Wear a good support bra for breast tenderness.   Take warm sitz baths to soothe any pain or discomfort caused by hemorrhoids. Use hemorrhoid cream if your health care  provider approves.   Rest with your legs elevated if you have leg cramps or low back pain.  If you develop varicose veins in your legs, wear support hose. Elevate your feet for 15 minutes, 3-4 times a day. Limit salt in your diet. Prenatal Care  Schedule your prenatal visits by the twelfth week of pregnancy. They are usually scheduled monthly at first, then more often in the last 2 months before delivery.  Write down your questions. Take them to your prenatal visits.  Keep all your prenatal visits as directed by your health care provider. Safety  Wear your seat belt at all times when driving.  Make a list of emergency phone numbers, including numbers for family, friends, the hospital, and police and fire departments. General Tips  Ask your health care provider for a referral to a local prenatal education class. Begin classes no later than at the beginning of month 6 of your pregnancy.  Ask for help if you have counseling or nutritional needs during pregnancy. Your health care provider can offer advice or refer you to specialists for help with various needs.  Do not use hot tubs, steam rooms, or saunas.  Do not douche or use tampons or scented sanitary pads.  Do not cross your legs for long periods of time.  Avoid cat litter boxes and soil used by cats. These carry germs that can cause birth defects in the baby and possibly loss of the fetus by miscarriage or stillbirth.  Avoid all smoking, herbs, alcohol, and medicines not prescribed by your health care provider. Chemicals in these affect the formation and growth of the baby.  Schedule a dentist appointment. At home, brush your teeth with a soft toothbrush and be gentle when you floss. SEEK MEDICAL CARE IF:   You have dizziness.  You have mild pelvic cramps, pelvic pressure, or nagging pain in the abdominal area.  You have persistent nausea, vomiting, or diarrhea.  You have a bad smelling vaginal discharge.  You have pain  with urination.  You notice increased swelling in your face, hands, legs, or ankles. SEEK IMMEDIATE MEDICAL CARE IF:   You have a fever.  You are leaking fluid from your vagina.  You have spotting or bleeding from your vagina.  You have severe abdominal cramping or pain.  You have rapid weight gain or loss.    You vomit blood or material that looks like coffee grounds.  You are exposed to [place] measles and have never had them.  You are exposed to fifth disease or chickenpox.  You develop a severe headache.  You have shortness of breath.  You have any kind of trauma, such as from a fall or a car accident. Document Released: 01/03/2001 Document Revised: 05/26/2013 Document Reviewed: 11/19/2012 [facility] Patient Information 2015 [place]. This information is not intended to replace advice given to you by your health care provider. Make sure you discuss any questions you have with your health care provider.   Heartburn During Pregnancy Heartburn is a burning sensation in the chest caused by stomach acid backing up into the esophagus. Heartburn is common in pregnancy because a certain hormone (progesterone) is released when a woman is pregnant. The progesterone hormone may relax the valve that separates the esophagus from the stomach. This allows acid to go up into the esophagus, causing heartburn. Heartburn may also happen in pregnancy because the enlarging uterus pushes up on the stomach, which pushes more acid into the esophagus. This is especially true in the later stages of pregnancy. Heartburn problems usually go away after giving birth. CAUSES  Heartburn is caused by stomach acid backing up into the esophagus. During pregnancy, this may result from various things, including:   The progesterone hormone.  Changing hormone levels.  The growing uterus pushing stomach acid upward.  Large meals.  Certain foods and drinks.  Exercise.  Increased acid production. SIGNS  AND SYMPTOMS   Burning pain in the chest or lower throat.  Bitter taste in the mouth.  Coughing. DIAGNOSIS  Your health care provider will typically diagnose heartburn by taking a careful history of your concern. Blood tests may be done to check for a certain type of bacteria that is associated with heartburn. Sometimes, heartburn is diagnosed by prescribing a heartburn medicine to see if the symptoms improve. In some cases, a procedure called an endoscopy may be done. In this procedure, a tube with a light and a camera on the end (endoscope) is used to examine the esophagus and the stomach. TREATMENT  Treatment will vary depending on the severity of your symptoms. Your health care provider may recommend:  Over-the-counter medicines (antacids, acid reducers) for mild heartburn.  Prescription medicines to decrease stomach acid or to protect your stomach lining.  Certain changes in your diet.  Elevating the head of your bed by putting blocks under the legs. This helps prevent stomach acid from backing up into the esophagus when you are lying down. HOME CARE INSTRUCTIONS   Only take over-the-counter or prescription medicines as directed by your health care provider.  Raise the head of your bed by putting blocks under the legs if instructed to do so by your health care provider. Sleeping with more pillows is not effective because it only changes the position of your head.  Do not exercise right after eating.  Avoid eating 2-3 hours before bed. Do not lie down right after eating.  Eat small meals throughout the day instead of three large meals.  Identify foods and beverages that make your symptoms worse and avoid them. Foods you may want to avoid include:  Peppers.  Chocolate.  High-fat foods, including fried foods.  Spicy foods.  Garlic and onions.  Citrus fruits, including oranges, grapefruit, lemons, and limes.  Food containing tomatoes or tomato  products.  Mint.  Carbonated and caffeinated drinks.  Vinegar. SEEK MEDICAL CARE IF:  You have abdominal pain of any kind.  You feel burning in your upper abdomen or chest, especially after eating or lying down.  You have nausea and vomiting.  Your stomach feels upset after you eat. SEEK IMMEDIATE MEDICAL CARE IF:   You have severe chest pain that goes down your arm or into your jaw or neck.  You feel sweaty, dizzy, or light-headed.  You become short of breath.  You vomit blood.  You have difficulty or pain with swallowing.  You have bloody or black, tarry stools.  You have episodes of heartburn more than 3 times a week, for more than 2 weeks. MAKE SURE YOU:  Understand these instructions.  Will watch your condition.  Will get help right away if you are not doing well or get worse.   This information is not intended to replace advice given to you by your health care provider. Make sure you discuss any questions you have with your health care provider.   Document Released: 01/07/2000 Document Revised: 01/30/2014 Document Reviewed: 08/28/2012 Elsevier Interactive Patient Education [employer]

## 2015-07-02 LAB — CYTOLOGY - PAP

## 2015-07-06 ENCOUNTER — Telehealth: Payer: Self-pay | Admitting: *Deleted

## 2015-07-06 ENCOUNTER — Encounter: Payer: Self-pay | Admitting: Women's Health

## 2015-07-06 DIAGNOSIS — R87619 Unspecified abnormal cytological findings in specimens from cervix uteri: Secondary | ICD-10-CM | POA: Insufficient documentation

## 2015-07-06 NOTE — Telephone Encounter (Signed)
-----   Message from Cheral MarkerKimberly R [name], [place] sent at 07/06/2015 11:37 AM EDT ----- Regarding: pap Please let her know pap is abnormal- needs colpo >14wks- will schedule at her next appt Thanks! ----- Message -----    From: Britta MccreedyLeah A Evans, CMA    Sent: 06/30/2015   2:59 PM      To: Cheral MarkerKimberly R [name], CNM

## 2015-07-06 NOTE — Telephone Encounter (Signed)
Pt informed of abnormal pap results (LSIL) from 06/30/2015, will schedule colposcopy after 14 weeks. Pt verbalized understanding.

## 2015-07-13 ENCOUNTER — Encounter: Payer: Self-pay | Admitting: Women's Health

## 2015-07-13 ENCOUNTER — Ambulatory Visit (INDEPENDENT_AMBULATORY_CARE_PROVIDER_SITE_OTHER): Payer: Medicaid Other | Admitting: Women's Health

## 2015-07-13 VITALS — BP 108/60 | HR 80 | Wt 190.0 lb

## 2015-07-13 DIAGNOSIS — Z331 Pregnant state, incidental: Secondary | ICD-10-CM

## 2015-07-13 DIAGNOSIS — Z3A10 10 weeks gestation of pregnancy: Secondary | ICD-10-CM

## 2015-07-13 DIAGNOSIS — N390 Urinary tract infection, site not specified: Secondary | ICD-10-CM

## 2015-07-13 DIAGNOSIS — Z1389 Encounter for screening for other disorder: Secondary | ICD-10-CM | POA: Diagnosis not present

## 2015-07-13 DIAGNOSIS — O2341 Unspecified infection of urinary tract in pregnancy, first trimester: Secondary | ICD-10-CM | POA: Diagnosis not present

## 2015-07-13 DIAGNOSIS — Z369 Encounter for antenatal screening, unspecified: Secondary | ICD-10-CM

## 2015-07-13 DIAGNOSIS — O2340 Unspecified infection of urinary tract in pregnancy, unspecified trimester: Secondary | ICD-10-CM | POA: Insufficient documentation

## 2015-07-13 LAB — POCT URINALYSIS DIPSTICK
GLUCOSE UA: NEGATIVE
Nitrite, UA: NEGATIVE

## 2015-07-13 MED ORDER — CEPHALEXIN 500 MG PO CAPS: 500.0000 mg | ORAL_CAPSULE | Freq: Four times a day (QID) | ORAL | Status: DC

## 2015-07-13 NOTE — Patient Instructions (Signed)
Pregnancy and Urinary Tract Infection  A urinary tract infection (UTI) is a bacterial infection of the urinary tract. Infection of the urinary tract can include the ureters, kidneys (pyelonephritis), bladder (cystitis), and urethra (urethritis). All pregnant women should be screened for bacteria in the urinary tract. Identifying and treating a UTI will decrease the risk of preterm labor and developing more serious infections in both the mother and baby.  CAUSES  Bacteria germs cause almost all UTIs.   RISK FACTORS  Many factors can increase your chances of getting a UTI during pregnancy. These include:  · Having a short urethra.  · Poor toilet and hygiene habits.  · Sexual intercourse.  · Blockage of urine along the urinary tract.  · Problems with the pelvic muscles or nerves.  · Diabetes.  · Obesity.  · Bladder problems after having several children.  · Previous history of UTI.  SIGNS AND SYMPTOMS   · Pain, burning, or a stinging feeling when urinating.  · Suddenly feeling the need to urinate right away (urgency).  · Loss of bladder control (urinary incontinence).  · Frequent urination, more than is common with pregnancy.  · Lower abdominal or back discomfort.  · Cloudy urine.  · Blood in the urine (hematuria).  · Fever.   When the kidneys are infected, the symptoms may be:  · Back pain.  · Flank pain on the right side more so than the left.  · Fever.  · Chills.  · Nausea.  · Vomiting.  DIAGNOSIS   A urinary tract infection is usually diagnosed through urine tests. Additional tests and procedures are sometimes done. These may include:  · Ultrasound exam of the kidneys, ureters, bladder, and urethra.  · Looking in the bladder with a lighted tube (cystoscopy).  TREATMENT  Typically, UTIs can be treated with antibiotic medicines.   HOME CARE INSTRUCTIONS   · Only take over-the-counter or prescription medicines as directed by your health care provider. If you were prescribed antibiotics, take them as directed. Finish  them even if you start to feel better.  · Drink enough fluids to keep your urine clear or pale yellow.  · Do not have sexual intercourse until the infection is gone and your health care provider says it is okay.  · Make sure you are tested for UTIs throughout your pregnancy. These infections often come back.   Preventing a UTI in the Future  · Practice good toilet habits. Always wipe from front to back. Use the tissue only once.  · Do not hold your urine. Empty your bladder as soon as possible when the urge comes.  · Do not douche or use deodorant sprays.  · Wash with soap and warm water around the genital area and the anus.  · Empty your bladder before and after sexual intercourse.  · Wear underwear with a cotton crotch.  · Avoid caffeine and carbonated drinks. They can irritate the bladder.  · Drink cranberry juice or take cranberry pills. This may decrease the risk of getting a UTI.  · Do not drink alcohol.  · Keep all your appointments and tests as scheduled.   SEEK MEDICAL CARE IF:   · Your symptoms get worse.  · You are still having fevers 2 or more days after treatment begins.  · You have a rash.  · You feel that you are having problems with medicines prescribed.  · You have abnormal vaginal discharge.  SEEK IMMEDIATE MEDICAL CARE IF:   · You have back or flank   pain.  · You have chills.  · You have blood in your urine.  · You have nausea and vomiting.  · You have contractions of your uterus.  · You have a gush of fluid from the vagina.  MAKE SURE YOU:  · Understand these instructions.    · Will watch your condition.    · Will get help right away if you are not doing well or get worse.       This information is not intended to replace advice given to you by your health care provider. Make sure you discuss any questions you have with your health care provider.     Document Released: 05/06/2010 Document Revised: 10/30/2012 Document Reviewed: 08/08/2012  Elsevier Interactive Patient Education ©2016 Elsevier  Inc.

## 2015-07-13 NOTE — Progress Notes (Signed)
[facility]  Patient name: [name] Berrey MRN [id number]  Date of birth: 25-Mar-1985  CC & HPI:  [name] Hinton is a 30 y.o. [phone] African American female at 7584w0d presenting today for report of urinary frequency and LBP beginning last night. Denies fever/chills, n/v.  Did not get her PN1 labs at new ob visit b/c wasn't feeling well, doesn't feel like going today- states she will come tomorrow.  Patient's last menstrual period was 04/30/2015 (approximate).  Pertinent History Reviewed:  Medical & Surgical Hx:   Past medical, surgical, family, and social history reviewed in electronic medical record Medications: Reviewed & Updated - see associated section Allergies: Reviewed in electronic medical record  Objective Findings:  Vitals: BP 108/60 mmHg  Pulse 80  Wt 190 lb (86.183 kg)  LMP 04/30/2015 (Approximate) Body mass index is 34.74 kg/(m^2).  Physical Examination: General appearance - alert, well appearing, and in no distress Neg CVAT Unable to hear FHT w/ doppler, 162 via informal u/s  Results for orders placed or performed in visit on 07/13/15 (from the past 24 hour(s))  POCT urinalysis dipstick   Collection Time: 07/13/15  3:29 PM  Result Value Ref Range   Color, UA     Clarity, UA     Glucose, UA neg    Bilirubin, UA     Ketones, UA large    Spec Grav, UA     Blood, UA moderate    pH, UA     Protein, UA small    Urobilinogen, UA     Nitrite, UA neg    Leukocytes, UA moderate (2+) (A) Negative     Assessment & Plan:  A:   6484w0d pregnant  UTI 1st trimester pregnancy  P:  Rx keflex qid x 7d  Send urine cx today, also send for UDS-wasn't sent at new ob visit  Reviewed s/s pyleo, reasons to seek care  Return for As scheduled.  Marge DuncansBooker, Dionisia Pacholski Randall CNM, [facility] ColumbusWHNP-BC 07/13/2015 3:58 PM

## 2015-07-14 ENCOUNTER — Encounter (HOSPITAL_COMMUNITY): Payer: Self-pay

## 2015-07-14 ENCOUNTER — Emergency Department (HOSPITAL_COMMUNITY)
Admission: EM | Admit: 2015-07-14 | Discharge: 2015-07-14 | Disposition: A | Discharge status: Home / Self Care | Payer: Medicaid Other | Attending: Emergency Medicine | Admitting: Emergency Medicine

## 2015-07-14 ENCOUNTER — Encounter: Payer: Self-pay | Admitting: Women's Health

## 2015-07-14 DIAGNOSIS — Z79899 Other long term (current) drug therapy: Secondary | ICD-10-CM | POA: Insufficient documentation

## 2015-07-14 DIAGNOSIS — R300 Dysuria: Secondary | ICD-10-CM | POA: Diagnosis present

## 2015-07-14 DIAGNOSIS — O99320 Drug use complicating pregnancy, unspecified trimester: Secondary | ICD-10-CM | POA: Insufficient documentation

## 2015-07-14 DIAGNOSIS — N12 Tubulo-interstitial nephritis, not specified as acute or chronic: Secondary | ICD-10-CM | POA: Insufficient documentation

## 2015-07-14 DIAGNOSIS — F1290 Cannabis use, unspecified, uncomplicated: Secondary | ICD-10-CM | POA: Insufficient documentation

## 2015-07-14 DIAGNOSIS — F17210 Nicotine dependence, cigarettes, uncomplicated: Secondary | ICD-10-CM | POA: Insufficient documentation

## 2015-07-14 DIAGNOSIS — F1490 Cocaine use, unspecified, uncomplicated: Secondary | ICD-10-CM

## 2015-07-14 LAB — COMPREHENSIVE METABOLIC PANEL
ALK PHOS: 41 U/L (ref 38–126)
ALT: 26 U/L (ref 14–54)
AST: 23 U/L (ref 15–41)
Albumin: 3.7 g/dL (ref 3.5–5.0)
Anion gap: 5 (ref 5–15)
BILIRUBIN TOTAL: 0.6 mg/dL (ref 0.3–1.2)
BUN: 5 mg/dL — ABNORMAL LOW (ref 6–20)
CALCIUM: 8.6 mg/dL — AB (ref 8.9–10.3)
CHLORIDE: 106 mmol/L (ref 101–111)
CO2: 22 mmol/L (ref 22–32)
CREATININE: 0.41 mg/dL — AB (ref 0.44–1.00)
Glucose, Bld: 92 mg/dL (ref 65–99)
Potassium: 3.7 mmol/L (ref 3.5–5.1)
Sodium: 133 mmol/L — ABNORMAL LOW (ref 135–145)
TOTAL PROTEIN: 7 g/dL (ref 6.5–8.1)

## 2015-07-14 LAB — URINALYSIS, ROUTINE W REFLEX MICROSCOPIC
BILIRUBIN URINE: NEGATIVE
Glucose, UA: NEGATIVE mg/dL
Nitrite: POSITIVE — AB
PROTEIN: 100 mg/dL — AB
Specific Gravity, Urine: 1.02 (ref 1.005–1.030)
pH: 7 (ref 5.0–8.0)

## 2015-07-14 LAB — CBC WITH DIFFERENTIAL/PLATELET
Basophils Absolute: 0 10*3/uL (ref 0.0–0.1)
Basophils Relative: 0 %
EOS PCT: 2 %
Eosinophils Absolute: 0.2 10*3/uL (ref 0.0–0.7)
HEMATOCRIT: 34.9 % — AB (ref 36.0–46.0)
Hemoglobin: 11.7 g/dL — ABNORMAL LOW (ref 12.0–15.0)
LYMPHS ABS: 1.5 10*3/uL (ref 0.7–4.0)
LYMPHS PCT: 18 %
MCH: 29.3 pg (ref 26.0–34.0)
MCHC: 33.5 g/dL (ref 30.0–36.0)
MCV: 87.5 fL (ref 78.0–100.0)
Monocytes Absolute: 0.9 10*3/uL (ref 0.1–1.0)
Monocytes Relative: 11 %
NEUTROS ABS: 5.9 10*3/uL (ref 1.7–7.7)
Neutrophils Relative %: 69 %
PLATELETS: 239 10*3/uL (ref 150–400)
RBC: 3.99 MIL/uL (ref 3.87–5.11)
RDW: 12.5 % (ref 11.5–15.5)
WBC: 8.5 10*3/uL (ref 4.0–10.5)

## 2015-07-14 LAB — PMP SCREEN PROFILE (10S), URINE
AMPHETAMINE SCRN UR: NEGATIVE ng/mL
Barbiturate Screen, Ur: NEGATIVE ng/mL
Benzodiazepine Screen, Urine: NEGATIVE ng/mL
CREATININE(CRT), U: 232.7 mg/dL (ref 20.0–300.0)
Cannabinoids Ur Ql Scn: POSITIVE ng/mL
Cocaine(Metab.)Screen, Urine: POSITIVE ng/mL
METHADONE SCREEN, URINE: NEGATIVE ng/mL
OXYCODONE+OXYMORPHONE UR QL SCN: NEGATIVE ng/mL
Opiate Scrn, Ur: NEGATIVE ng/mL
PCP SCRN UR: NEGATIVE ng/mL
PH UR, DRUG SCRN: 6.7 (ref 4.5–8.9)
Propoxyphene, Screen: NEGATIVE ng/mL

## 2015-07-14 LAB — URINE MICROSCOPIC-ADD ON

## 2015-07-14 MED ORDER — DEXTROSE 5 % IV SOLN
1.0000 g | Freq: Once | INTRAVENOUS | Status: AC
  Administered 2015-07-14: 1 g via INTRAVENOUS
  Filled 2015-07-14: qty 10

## 2015-07-14 MED ORDER — ACETAMINOPHEN 325 MG PO TABS
650.0000 mg | ORAL_TABLET | ORAL | Status: DC | PRN
  Administered 2015-07-14: 650 mg via ORAL
  Filled 2015-07-14: qty 2

## 2015-07-14 MED ORDER — CEPHALEXIN 500 MG PO CAPS: 500.0000 mg | ORAL_CAPSULE | Freq: Three times a day (TID) | ORAL | Status: DC

## 2015-07-14 NOTE — ED Notes (Addendum)
Pt reports that she has been burning with urination for 2 days. [facility] and was given an abt for UTI, but has not started it. Now has pain in lower abd that radiates to back. Denies any vaginal bleeding. Pt is [redacted] weeks pregnant

## 2015-07-14 NOTE — Discharge Instructions (Signed)

## 2015-07-14 NOTE — ED Provider Notes (Signed)
CSN: [id number]     Arrival date & time 07/14/15  0909 History  By signing my name below, I, Doreatha Martin, attest that this documentation has been prepared under the direction and in the presence of Loren Racer, MD. Electronically Signed: Doreatha Martin, ED Scribe. 07/14/2015. 10:44 AM.    Chief Complaint  Patient presents with  . Dysuria   The history is provided by the patient. No language interpreter was used.   HPI Comments: [name] is a 30 y.o. female [redacted]w[redacted]d [phone] who presents to the Emergency Department complaining of a pelvic pressure sensation with urination for 2 days with associated right flank pain onset yesterday. Pt was seen by her OB/GYN yesterday for evaluation of urinary frequency, UA indicated UTI and pt was given rx for qid Keflex x7 days. Pt states she has not yet filled her prescription and her symptoms worsened last night. She denies any vaginal bleeding or discharge, nausea, emesis, burning with urination. No fever or chills. Patient has IUP confirmed by ultrasound.  Past Medical History  Diagnosis Date  . History of chlamydia   . History of bronchitis   . Abnormal pap 10/2011  . History of trichomoniasis   . Abortion   . Abnormal Pap smear   . Nausea and vomiting 11/18/2012  . Back pain 11/18/2012  . Anxiety   . History of gonorrhea   . Anxiety   . Urge incontinence 01/13/2014  . Vaginal Pap smear, abnormal   . History of abnormal cervical Pap smear 01/13/2014  . Pregnant 06/11/2015  . Nausea and vomiting during pregnancy 06/11/2015   Past Surgical History  Procedure Laterality Date  . Mouth surgery    . Elective abortion     Family History  Problem Relation Age of Onset  . Cancer Maternal Aunt     breast  . Diabetes Maternal Grandmother   . Hypertension Maternal Grandmother   . Hypertension Maternal Grandfather   . Schizophrenia Brother   . Seizures Brother   . Asthma Son   . Fibroids Mother     fibroid tumor in uterus   Social  History  Substance Use Topics  . Smoking status: Current Every Day Smoker -- 0.50 packs/day for 10 years    Types: Cigarettes  . Smokeless tobacco: Never Used  . Alcohol Use: No     Comment: occ   OB History    Gravida Para Term Preterm AB TAB SAB Ectopic Multiple Living   Review of Systems  Constitutional: Negative for fever and chills.  Respiratory: Negative for shortness of breath.   Gastrointestinal: Negative for nausea, vomiting, abdominal pain, diarrhea and constipation.  Genitourinary: Positive for dysuria, frequency and flank pain. Negative for vaginal bleeding, vaginal discharge, difficulty urinating and pelvic pain.  Musculoskeletal: Positive for back pain.  Skin: Negative for rash and wound.  Neurological: Negative for dizziness, light-headedness, numbness and headaches.  All other systems reviewed and are negative.  Allergies  Strawberry extract and Latex  Home Medications   Prior to Admission medications   Medication Sig Start Date End Date Taking? Authorizing Provider  albuterol (PROVENTIL HFA;VENTOLIN HFA) 108 (90 Base) MCG/ACT inhaler Inhale 2 puffs into the lungs every 6 (six) hours as needed for wheezing or shortness of breath.   Yes Historical Provider, MD  escitalopram (LEXAPRO) 10 MG tablet Take 1 tablet (10 mg total) by mouth daily. 06/30/15  Yes Cheral Marker, CNM  hydrOXYzine (ATARAX/VISTARIL) 50 MG tablet Take 50 mg by mouth 3 (three) times daily as needed for anxiety. Reported on 07/13/2015   Yes Historical Provider, MD  pantoprazole (PROTONIX) 20 MG tablet Take 1 tablet (20 mg total) by mouth daily. 06/30/15  Yes Cheral Marker, CNM  prenatal vitamin w/FE, FA (PRENATAL 1 + 1) 27-1 MG TABS tablet Take 1 tablet by mouth daily at 12 noon. 06/11/15  Yes Adline Potter, NP  cephALEXin (KEFLEX) 500 MG capsule Take 1 capsule (500 mg total) by mouth 3 (three) times daily. X 7 days 07/14/15   Loren Racer, MD  Doxylamine-Pyridoxine  (DICLEGIS) 10-10 MG TBEC Take 2 at hs for 2 days then 3rd day 1 in am and 2 at hs then 4th day 1 in am 1 in pm and 2 at hs Patient not taking: Reported on 07/14/2015 06/11/15   Adline Potter, NP   BP 121/70 mmHg  Pulse 88  Temp(Src) 98.2 F (36.8 C) (Oral)  Resp 14  Wt 190 lb (86.183 kg)  SpO2 100%  LMP 04/30/2015 (Approximate) Physical Exam  Constitutional: She is oriented to person, place, and time. She appears well-developed and well-nourished. No distress.  Patient is resting comfortably.  HENT:  Head: Normocephalic and atraumatic.  Mouth/Throat: Oropharynx is clear and moist.  Eyes: EOM are normal. Pupils are equal, round, and reactive to light.  Neck: Normal range of motion. Neck supple.  Cardiovascular: Normal rate and regular rhythm.   Pulmonary/Chest: Effort normal and breath sounds normal. No respiratory distress. She has no wheezes. She has no rales.  Abdominal: Soft. Bowel sounds are normal. She exhibits no distension and no mass. There is tenderness (mild left upper quadrant tenderness with deep palpation. Mild suprapubic tenderness.). There is no rebound and no guarding.  Musculoskeletal: Normal range of motion. She exhibits no edema or tenderness.  Left CVA tenderness to percussion  Neurological: She is alert and oriented to person, place, and time.  Moves all extremities without deficit. Sensation is fully intact.  Skin: Skin is warm and dry. No rash noted. No erythema.  Psychiatric: She has a normal mood and affect. Her behavior is normal.  Nursing note and vitals reviewed.   ED Course  Procedures (including critical care time) DIAGNOSTIC STUDIES: Oxygen Saturation is 100% on RA, normal by my interpretation.    COORDINATION OF CARE: 10:40 AM Discussed treatment plan with pt at bedside which includes UA and pt agreed to plan.   Labs Review Labs Reviewed  URINALYSIS, ROUTINE W REFLEX MICROSCOPIC (NOT AT [facility]) - Abnormal; Notable for the following:    Hgb  urine dipstick MODERATE (*)    Ketones, ur TRACE (*)    Protein, ur 100 (*)    Nitrite POSITIVE (*)    Leukocytes, UA MODERATE (*)    All other components within normal limits  URINE MICROSCOPIC-ADD ON - Abnormal; Notable for the following:    Squamous Epithelial / LPF 0-5 (*)    Bacteria, UA MANY (*)    All other components within normal limits  CBC WITH DIFFERENTIAL/PLATELET - Abnormal; Notable for the following:    Hemoglobin 11.7 (*)    HCT 34.9 (*)    All other components within normal limits  COMPREHENSIVE METABOLIC PANEL - Abnormal; Notable for the following:    Sodium 133 (*)    BUN 5 (*)    Creatinine, Ser 0.41 (*)    Calcium 8.6 (*)    All other components within normal limits  Imaging Review No results found. I have personally reviewed and evaluated these images and lab results as part of my medical decision-making.   EKG Interpretation None      MDM   Final diagnoses:  Pyelonephritis    I personally performed the services described in this documentation, which was scribed in my presence. The recorded information has been reviewed and is accurate.   Patient with urinary symptoms now presents with flank pain. This is concerning for early pyelonephritis. She is very well-appearing. She denies any vaginal complaints. Was seen by her OB/GYN yesterday. We'll give IV Rocephin in the emergency department and check basic labs. Anticipate discharge home with Keflex and OB follow-up.  Vital signs remained stable. Normal white blood cell count. Pelvic exam deferred due to recent evaluation by her OB/GYN and no vaginal complaints.  Loren Raceravid Candace Ramus, MD 07/14/15 1258

## 2015-07-14 NOTE — ED Notes (Signed)
Patient verbalizes understanding of discharge instructions, prescriptions, home care and follow up care. Patient out of department at this time. 

## 2015-07-14 NOTE — ED Notes (Signed)
Pt reports she was having the abd. Pain yesterday and was told by OB to come to ED if worsens

## 2015-07-15 LAB — URINE CULTURE

## 2015-07-28 ENCOUNTER — Encounter: Payer: Medicaid Other | Admitting: Obstetrics & Gynecology

## 2015-07-28 ENCOUNTER — Other Ambulatory Visit: Payer: Medicaid Other

## 2015-08-02 ENCOUNTER — Other Ambulatory Visit: Payer: Medicaid Other

## 2015-08-02 ENCOUNTER — Encounter: Payer: Medicaid Other | Admitting: Women's Health

## 2015-08-03 ENCOUNTER — Ambulatory Visit: Payer: Medicaid Other

## 2015-08-03 ENCOUNTER — Ambulatory Visit: Payer: Medicaid Other | Admitting: Advanced Practice Midwife

## 2015-08-11 ENCOUNTER — Encounter: Payer: Medicaid Other | Admitting: Obstetrics & Gynecology

## 2015-08-20 ENCOUNTER — Ambulatory Visit (INDEPENDENT_AMBULATORY_CARE_PROVIDER_SITE_OTHER): Payer: Medicaid Other | Admitting: Obstetrics & Gynecology

## 2015-08-20 ENCOUNTER — Encounter: Payer: Self-pay | Admitting: Obstetrics & Gynecology

## 2015-08-20 VITALS — BP 120/70 | HR 80 | Ht 62.0 in | Wt 189.0 lb

## 2015-08-20 DIAGNOSIS — Z3202 Encounter for pregnancy test, result negative: Secondary | ICD-10-CM | POA: Diagnosis not present

## 2015-08-20 DIAGNOSIS — N870 Mild cervical dysplasia: Secondary | ICD-10-CM

## 2015-08-20 DIAGNOSIS — R87612 Low grade squamous intraepithelial lesion on cytologic smear of cervix (LGSIL): Secondary | ICD-10-CM | POA: Diagnosis not present

## 2015-08-20 DIAGNOSIS — IMO0002 Reserved for concepts with insufficient information to code with codable children: Secondary | ICD-10-CM

## 2015-08-20 LAB — POCT URINE PREGNANCY: Preg Test, Ur: NEGATIVE

## 2015-08-20 MED ORDER — MEDROXYPROGESTERONE ACETATE 150 MG/ML IM SUSP: 150.0000 mg | INTRAMUSCULAR | 3 refills | Status: DC

## 2015-08-20 NOTE — Progress Notes (Signed)
Colposcopy Procedure Note:  Colposcopy Procedure Note  Indications: Pap smear 2 months ago showed: low-grade squamous intraepithelial neoplasia (LGSIL - encompassing HPV,mild dysplasia,CIN I). The prior pap showed low-grade squamous intraepithelial neoplasia (LGSIL - encompassing HPV,mild dysplasia,CIN I).  Prior cervical/vaginal disease: normal exam without visible pathology. Prior cervical treatment: no treatment.  Smoker:  No. New sexual partner:  Yes.    : time frame:    History of abnormal Pap: yes  Procedure Details  The risks and benefits of the procedure and Written informed consent obtained.  Speculum placed in vagina and excellent visualization of cervix achieved, cervix swabbed x 3 with acetic acid solution.  Findings: Cervix: no visible lesions, no mosaicism, no punctation, no abnormal vasculature and HPV changes noted at 12 o'clock; no biopsies taken. Vaginal inspection: normal without visible lesions. Vulvar colposcopy: vulvar colposcopy not performed.  Specimens:   Complications: none.  Plan: No biopsy  Recommend repeat Pap 1 year for follow up

## 2015-08-24 ENCOUNTER — Ambulatory Visit: Payer: Medicaid Other

## 2015-09-24 ENCOUNTER — Ambulatory Visit: Payer: Medicaid Other

## 2015-09-24 ENCOUNTER — Encounter: Payer: Self-pay | Admitting: Obstetrics & Gynecology

## 2016-03-10 ENCOUNTER — Emergency Department (HOSPITAL_COMMUNITY): Payer: Medicaid Other

## 2016-03-10 ENCOUNTER — Emergency Department (HOSPITAL_COMMUNITY)
Admission: EM | Admit: 2016-03-10 | Discharge: 2016-03-10 | Disposition: A | Discharge status: Home / Self Care | Payer: Medicaid Other | Attending: Emergency Medicine | Admitting: Emergency Medicine

## 2016-03-10 ENCOUNTER — Encounter (HOSPITAL_COMMUNITY): Payer: Self-pay

## 2016-03-10 ENCOUNTER — Emergency Department (HOSPITAL_COMMUNITY)
Admission: EM | Admit: 2016-03-10 | Discharge: 2016-03-10 | Disposition: A | Discharge status: Home / Self Care | Payer: Medicaid Other | Source: Home / Self Care | Attending: Emergency Medicine | Admitting: Emergency Medicine

## 2016-03-10 ENCOUNTER — Encounter (HOSPITAL_COMMUNITY): Payer: Self-pay | Admitting: Emergency Medicine

## 2016-03-10 DIAGNOSIS — Y999 Unspecified external cause status: Secondary | ICD-10-CM | POA: Insufficient documentation

## 2016-03-10 DIAGNOSIS — S0990XA Unspecified injury of head, initial encounter: Secondary | ICD-10-CM | POA: Diagnosis present

## 2016-03-10 DIAGNOSIS — F17210 Nicotine dependence, cigarettes, uncomplicated: Secondary | ICD-10-CM | POA: Diagnosis not present

## 2016-03-10 DIAGNOSIS — M25512 Pain in left shoulder: Secondary | ICD-10-CM | POA: Diagnosis not present

## 2016-03-10 DIAGNOSIS — Y9389 Activity, other specified: Secondary | ICD-10-CM | POA: Diagnosis not present

## 2016-03-10 DIAGNOSIS — M25552 Pain in left hip: Secondary | ICD-10-CM

## 2016-03-10 DIAGNOSIS — Y92410 Unspecified street and highway as the place of occurrence of the external cause: Secondary | ICD-10-CM | POA: Insufficient documentation

## 2016-03-10 DIAGNOSIS — S161XXA Strain of muscle, fascia and tendon at neck level, initial encounter: Secondary | ICD-10-CM | POA: Diagnosis not present

## 2016-03-10 DIAGNOSIS — M25522 Pain in left elbow: Secondary | ICD-10-CM

## 2016-03-10 LAB — POC URINE PREG, ED: PREG TEST UR: NEGATIVE

## 2016-03-10 MED ORDER — HYDROXYZINE HCL 25 MG PO TABS
25.0000 mg | ORAL_TABLET | Freq: Once | ORAL | Status: AC
  Administered 2016-03-10: 25 mg via ORAL
  Filled 2016-03-10: qty 1

## 2016-03-10 MED ORDER — IBUPROFEN 800 MG PO TABS
800.0000 mg | ORAL_TABLET | Freq: Once | ORAL | Status: AC
  Administered 2016-03-10: 800 mg via ORAL
  Filled 2016-03-10: qty 1

## 2016-03-10 MED ORDER — IBUPROFEN 800 MG PO TABS: 800.0000 mg | ORAL_TABLET | Freq: Three times a day (TID) | ORAL | 0 refills | Status: DC | PRN
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
## 2016-03-10 NOTE — ED Notes (Signed)
Pt states "Ya'll are pissing me off with this wait. I'm going to another hospital."

## 2016-03-10 NOTE — Discharge Instructions (Signed)
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
## 2016-03-14 NOTE — ED Notes (Signed)
Pt presents to ED post discharge inquiring about work note stating pt can return to work with no restrictions. Most recent note states pt can return to work on 2/19 with no restrictions but initial note stated return date 2/20. Note printed on 03/13/16 changed to reflect return date of 2/20 with no restrictions.

## 2016-05-08 ENCOUNTER — Other Ambulatory Visit: Payer: Self-pay

## 2016-05-08 ENCOUNTER — Encounter: Payer: Self-pay | Admitting: Emergency Medicine

## 2016-05-08 DIAGNOSIS — F17210 Nicotine dependence, cigarettes, uncomplicated: Secondary | ICD-10-CM | POA: Diagnosis not present

## 2016-05-08 DIAGNOSIS — Z5321 Procedure and treatment not carried out due to patient leaving prior to being seen by health care provider: Secondary | ICD-10-CM | POA: Diagnosis not present

## 2016-05-08 DIAGNOSIS — R079 Chest pain, unspecified: Secondary | ICD-10-CM | POA: Insufficient documentation

## 2016-05-08 LAB — PREGNANCY, URINE: Preg Test, Ur: NEGATIVE

## 2016-05-08 NOTE — ED Triage Notes (Signed)
Presents sided chest burning and pain that began yesterday-does not feel like her anxiety chest pain, associated with SOB-does not take anxiety medications anymore-tried Ibuprofen  today without relief. No other medications prior to transport. Endorses burping a lot. "I have burped so much it made me choke" denies cough.

## 2016-05-09 ENCOUNTER — Emergency Department (HOSPITAL_COMMUNITY)
Admission: EM | Admit: 2016-05-09 | Discharge: 2016-05-09 | Disposition: A | Discharge status: Home / Self Care | Payer: Medicaid Other | Attending: Dermatology | Admitting: Dermatology

## 2016-05-09 NOTE — ED Notes (Signed)
Per registration, the pt came to the window stating she was leaving to go to another hospital and pt refused to let Pam in registration cut her armband off.

## 2016-07-10 ENCOUNTER — Other Ambulatory Visit (HOSPITAL_COMMUNITY)
Admission: RE | Admit: 2016-07-10 | Discharge: 2016-07-10 | Disposition: A | Discharge status: Home / Self Care | Payer: Medicaid Other | Source: Ambulatory Visit | Attending: Obstetrics & Gynecology | Admitting: Obstetrics & Gynecology

## 2016-07-10 ENCOUNTER — Ambulatory Visit (INDEPENDENT_AMBULATORY_CARE_PROVIDER_SITE_OTHER): Payer: Medicaid Other | Admitting: Obstetrics & Gynecology

## 2016-07-10 ENCOUNTER — Encounter: Payer: Self-pay | Admitting: Obstetrics & Gynecology

## 2016-07-10 VITALS — BP 110/80 | HR 78 | Wt 179.0 lb

## 2016-07-10 DIAGNOSIS — Z01411 Encounter for gynecological examination (general) (routine) with abnormal findings: Secondary | ICD-10-CM | POA: Diagnosis not present

## 2016-07-10 DIAGNOSIS — Z0000 Encounter for general adult medical examination without abnormal findings: Secondary | ICD-10-CM | POA: Diagnosis not present

## 2016-07-10 DIAGNOSIS — Z01419 Encounter for gynecological examination (general) (routine) without abnormal findings: Secondary | ICD-10-CM

## 2016-07-10 MED ORDER — MEDROXYPROGESTERONE ACETATE 150 MG/ML IM SUSP: 150.0000 mg | INTRAMUSCULAR | 3 refills | Status: DC

## 2016-07-10 MED ORDER — OMEPRAZOLE 20 MG PO CPDR: 20.0000 mg | DELAYED_RELEASE_CAPSULE | Freq: Every day | ORAL | 6 refills | Status: DC

## 2016-07-10 NOTE — Progress Notes (Signed)
Subjective:     [name] is a 31 y.o. female here for a routine exam.  Patient's last menstrual period was 07/08/2016. [id number] Birth Control Method:  none Menstrual Calendar(currently): regular  Current complaints: chest pain, ?GERD.   Current acute medical issues:  none   Recent Gynecologic History Patient's last menstrual period was 07/08/2016. Last Pap: 06/2015,  Low grade dysplasia Last mammogram: na,    Past Medical History:  Diagnosis Date  . Abnormal pap 10/2011  . Abnormal Pap smear   . Abortion   . Anxiety   . Anxiety   . Back pain 11/18/2012  . History of abnormal cervical Pap smear 01/13/2014  . History of bronchitis   . History of chlamydia   . History of gonorrhea   . History of trichomoniasis   . Nausea and vomiting 11/18/2012  . Nausea and vomiting during pregnancy 06/11/2015  . Pregnant 06/11/2015  . Urge incontinence 01/13/2014  . Vaginal Pap smear, abnormal     Past Surgical History:  Procedure Laterality Date  . ELECTIVE ABORTION    . MOUTH SURGERY      OB History    Gravida Para Term Preterm AB Living   5 1 1   3 1    SAB TAB Ectopic Multiple Live Births   2 1     1       Social History   Social History  . Marital status: Single    Spouse name: N/A  . Number of children: N/A  . Years of education: N/A   Social History Main Topics  . Smoking status: Current Every Day Smoker    Packs/day: 0.50    Years: 10.00    Types: Cigarettes  . Smokeless tobacco: Never Used  . Alcohol use No     Comment: occ  . Drug use: No     Comment: Per patient exposed to second hand Marijuana smoke  . Sexual activity: Yes    Birth control/ protection: None   Other Topics Concern  . None   Social History Narrative  . None    Family History  Problem Relation Age of Onset  . Cancer Maternal Aunt        breast  . Diabetes Maternal Grandmother   . Hypertension Maternal Grandmother   . Hypertension Maternal Grandfather   . Schizophrenia  Brother   . Seizures Brother   . Asthma Son   . Fibroids Mother        fibroid tumor in uterus     Current Outpatient Prescriptions:  .  albuterol (PROVENTIL HFA;VENTOLIN HFA) 108 (90 Base) MCG/ACT inhaler, Inhale 2 puffs into the lungs every 6 (six) hours as needed for wheezing or shortness of breath., Disp: , Rfl:  .  ibuprofen (ADVIL,MOTRIN) 800 MG tablet, Take 1 tablet (800 mg total) by mouth every 8 (eight) hours as needed. (Patient not taking: Reported on 07/10/2016), Disp: 21 tablet, Rfl: 0 .  medroxyPROGESTERone (DEPO-PROVERA) 150 MG/ML injection, Inject 1 mL (150 mg total) into the muscle every 3 (three) months., Disp: 1 mL, Rfl: 3 .  omeprazole (PRILOSEC) 20 MG capsule, Take 1 capsule (20 mg total) by mouth daily. 1 tablet a day, Disp: 30 capsule, Rfl: 6  Review of Systems  Review of Systems  Constitutional: Negative for fever, chills, weight loss, malaise/fatigue and diaphoresis.  HENT: Negative for hearing loss, ear pain, nosebleeds, congestion, sore throat, neck pain, tinnitus and ear discharge.   Eyes: Negative for blurred vision, double vision, photophobia,  pain, discharge and redness.  Respiratory: Negative for cough, hemoptysis, sputum production, shortness of breath, wheezing and stridor.   Cardiovascular: Negative for chest pain, palpitations, orthopnea, claudication, leg swelling and PND.  Gastrointestinal: negative for abdominal pain. Negative for heartburn, nausea, vomiting, diarrhea, constipation, blood in stool and melena.  Genitourinary: Negative for dysuria, urgency, frequency, hematuria and flank pain.  Musculoskeletal: Negative for myalgias, back pain, joint pain and falls.  Skin: Negative for itching and rash.  Neurological: Negative for dizziness, tingling, tremors, sensory change, speech change, focal weakness, seizures, loss of consciousness, weakness and headaches.  Endo/Heme/Allergies: Negative for environmental allergies and polydipsia. Does not  bruise/bleed easily.  Psychiatric/Behavioral: Negative for depression, suicidal ideas, hallucinations, memory loss and substance abuse. The patient is not nervous/anxious and does not have insomnia.        Objective:  Blood pressure 110/80, pulse 78, weight 179 lb (81.2 kg), last menstrual period 07/08/2016, unknown if currently breastfeeding.   Physical Exam  Vitals reviewed. Constitutional: She is oriented to person, place, and time. She appears well-developed and well-nourished.  HENT:  Head: Normocephalic and atraumatic.        Right Ear: External ear normal.  Left Ear: External ear normal.  Nose: Nose normal.  Mouth/Throat: Oropharynx is clear and moist.  Eyes: Conjunctivae and EOM are normal. Pupils are equal, round, and reactive to light. Right eye exhibits no discharge. Left eye exhibits no discharge. No scleral icterus.  Neck: Normal range of motion. Neck supple. No tracheal deviation present. No thyromegaly present.  Cardiovascular: Normal rate, regular rhythm, normal heart sounds and intact distal pulses.  Exam reveals no gallop and no friction rub.   No murmur heard. Respiratory: Effort normal and breath sounds normal. No respiratory distress. She has no wheezes. She has no rales. She exhibits no tenderness.  GI: Soft. Bowel sounds are normal. She exhibits no distension and no mass. There is no tenderness. There is no rebound and no guarding.  Genitourinary:  Breasts no masses skin changes or nipple changes bilaterally      Vulva is normal without lesions Vagina is pink moist without discharge Cervix normal in appearance and pap is done Uterus is normal size shape and contour Adnexa is negative with normal sized ovaries   Musculoskeletal: Normal range of motion. She exhibits no edema and no tenderness.  Neurological: She is alert and oriented to person, place, and time. She has normal reflexes. She displays normal reflexes. No cranial nerve deficit. She exhibits normal  muscle tone. Coordination normal.  Skin: Skin is warm and dry. No rash noted. No erythema. No pallor.  Psychiatric: She has a normal mood and affect. Her behavior is normal. Judgment and thought content normal.       Medications Ordered at today's visit: Meds ordered this encounter  Medications  . medroxyPROGESTERone (DEPO-PROVERA) 150 MG/ML injection    Sig: Inject 1 mL (150 mg total) into the muscle every 3 (three) months.    Dispense:  1 mL    Refill:  3  . omeprazole (PRILOSEC) 20 MG capsule    Sig: Take 1 capsule (20 mg total) by mouth daily. 1 tablet a day    Dispense:  30 capsule    Refill:  6    Other orders placed at today's visit: No orders of the defined types were placed in this encounter.     Assessment:    Healthy female exam.    Plan:    call on menses to restart depo provera  Pap result pending   Try prilosec to treat possible GERD symptoms  Return call on her menses to receive depo provera injection, prescription was e prescribed.

## 2016-07-13 LAB — CYTOLOGY - PAP: Diagnosis: NEGATIVE

## 2017-01-23 NOTE — L&D Delivery Note (Addendum)
Patient is 32 y.o. [id number] [redacted]w[redacted]d admitted for elective IOL   Delivery Note At 4:48 AM a viable female was delivered via Vaginal, Spontaneous (Presentation: cephalic; LOA ).  APGAR: 9, 9;    Placenta status: Spontaneous, intact.  Cord: 3VC   Anesthesia:  epidural Episiotomy: None Lacerations: 1st degree;Labial, no repair Suture Repair: none Est. Blood Loss (mL): 200  Mom to postpartum.  Baby to Couplet care / Skin to Skin.  Upon arrival patient was complete and pushing. She pushed with good maternal effort to deliver a healthy baby boy. Baby delivered without difficulty, was noted to have good tone and place on maternal abdomen for oral suctioning, drying and stimulation. Delayed cord clamping performed. Placenta delivered intact with 3V cord. Vaginal canal and perineum was inspected and found to have a right periurethral lac (first degree), a right sided periurethral erosion and a perineal first degree laceration.  These lacerations were hemostatic and did not require suture repair. Pitocin was started and uterus massaged until bleeding slowed. Counts of sharps, instruments, and lap pads were all correct.   Mirian Mo, MD PGY-1 9/3/20195:15 AM  I confirm that I have verified the information documented in the resident's note and that I have also personally reperformed the physical exam and all medical decision making activities.  I was gloved and present for entire delivery SVD without incident No difficulty with shoulders Lacerations as listed above Repair of same not indicated Aviva Signs, CNM

## 2017-02-06 ENCOUNTER — Encounter: Payer: Self-pay | Admitting: Adult Health

## 2017-02-06 ENCOUNTER — Ambulatory Visit: Payer: Medicaid Other | Admitting: Adult Health

## 2017-02-06 VITALS — BP 118/70 | HR 94 | Ht 62.0 in | Wt 169.0 lb

## 2017-02-06 DIAGNOSIS — Z3201 Encounter for pregnancy test, result positive: Secondary | ICD-10-CM | POA: Diagnosis not present

## 2017-02-06 DIAGNOSIS — R112 Nausea with vomiting, unspecified: Secondary | ICD-10-CM | POA: Diagnosis not present

## 2017-02-06 DIAGNOSIS — O219 Vomiting of pregnancy, unspecified: Secondary | ICD-10-CM

## 2017-02-06 DIAGNOSIS — O3680X0 Pregnancy with inconclusive fetal viability, not applicable or unspecified: Secondary | ICD-10-CM

## 2017-02-06 DIAGNOSIS — Z3A01 Less than 8 weeks gestation of pregnancy: Secondary | ICD-10-CM | POA: Insufficient documentation

## 2017-02-06 DIAGNOSIS — N926 Irregular menstruation, unspecified: Secondary | ICD-10-CM

## 2017-02-06 DIAGNOSIS — F419 Anxiety disorder, unspecified: Secondary | ICD-10-CM

## 2017-02-06 LAB — POCT URINE PREGNANCY: PREG TEST UR: POSITIVE — AB

## 2017-02-06 MED ORDER — ALBUTEROL SULFATE HFA 108 (90 BASE) MCG/ACT IN AERS: 2.0000 | INHALATION_SPRAY | Freq: Four times a day (QID) | RESPIRATORY_TRACT | 1 refills | Status: DC | PRN

## 2017-02-06 MED ORDER — ESCITALOPRAM OXALATE 10 MG PO TABS: 10.0000 mg | ORAL_TABLET | Freq: Every day | ORAL | 12 refills | Status: DC

## 2017-02-06 MED ORDER — DOXYLAMINE-PYRIDOXINE 10-10 MG PO TBEC: DELAYED_RELEASE_TABLET | ORAL | 3 refills | Status: DC

## 2017-02-06 MED ORDER — PRENATAL PLUS 27-1 MG PO TABS: 1.0000 | ORAL_TABLET | Freq: Every day | ORAL | 0 refills | Status: DC

## 2017-02-06 NOTE — Patient Instructions (Signed)
First Trimester of Pregnancy The first trimester of pregnancy is from week 1 until the end of week 13 (months 1 through 3). A week after a sperm fertilizes an egg, the egg will implant on the wall of the uterus. This embryo will begin to develop into a baby. Genes from you and your partner will form the baby. The female genes will determine whether the baby will be a boy or a girl. At 6-8 weeks, the eyes and face will be formed, and the heartbeat can be seen on ultrasound. At the end of 12 weeks, all the baby's organs will be formed. Now that you are pregnant, you will want to do everything you can to have a healthy baby. Two of the most important things are to get good prenatal care and to follow your health care provider's instructions. Prenatal care is all the medical care you receive before the baby's birth. This care will help prevent, find, and treat any problems during the pregnancy and childbirth. Body changes during your first trimester Your body goes through many changes during pregnancy. The changes vary from woman to woman.  You may gain or lose a couple of pounds at first.  You may feel sick to your stomach (nauseous) and you may throw up (vomit). If the vomiting is uncontrollable, call your health care provider.  You may tire easily.  You may develop headaches that can be relieved by medicines. All medicines should be approved by your health care provider.  You may urinate more often. Painful urination may mean you have a bladder infection.  You may develop heartburn as a result of your pregnancy.  You may develop constipation because certain hormones are causing the muscles that push stool through your intestines to slow down.  You may develop hemorrhoids or swollen veins (varicose veins).  Your breasts may begin to grow larger and become tender. Your nipples may stick out more, and the tissue that surrounds them (areola) may become darker.  Your gums may bleed and may be  sensitive to brushing and flossing.  Dark spots or blotches (chloasma, mask of pregnancy) may develop on your face. This will likely fade after the baby is born.  Your menstrual periods will stop.  You may have a loss of appetite.  You may develop cravings for certain kinds of food.  You may have changes in your emotions from day to day, such as being excited to be pregnant or being concerned that something may go wrong with the pregnancy and baby.  You may have more vivid and strange dreams.  You may have changes in your hair. These can include thickening of your hair, rapid growth, and changes in texture. Some women also have hair loss during or after pregnancy, or hair that feels dry or thin. Your hair will most likely return to normal after your baby is born.  What to expect at prenatal visits During a routine prenatal visit:  You will be weighed to make sure you and the baby are growing normally.  Your blood pressure will be taken.  Your abdomen will be measured to track your baby's growth.  The fetal heartbeat will be listened to between weeks 10 and 14 of your pregnancy.  Test results from any previous visits will be discussed.  Your health care provider may ask you:  How you are feeling.  If you are feeling the baby move.  If you have had any abnormal symptoms, such as leaking fluid, bleeding, severe headaches,   or abdominal cramping.  If you are using any tobacco products, including cigarettes, chewing tobacco, and electronic cigarettes.  If you have any questions.  Other tests that may be performed during your first trimester include:  Blood tests to find your blood type and to check for the presence of any previous infections. The tests will also be used to check for low iron levels (anemia) and protein on red blood cells (Rh antibodies). Depending on your risk factors, or if you previously had diabetes during pregnancy, you may have tests to check for high blood  sugar that affects pregnant women (gestational diabetes).  Urine tests to check for infections, diabetes, or protein in the urine.  An ultrasound to confirm the proper growth and development of the baby.  Fetal screens for spinal cord problems (spina bifida) and Down syndrome.  HIV (human immunodeficiency virus) testing. Routine prenatal testing includes screening for HIV, unless you choose not to have this test.  You may need other tests to make sure you and the baby are doing well.  Follow these instructions at home: Medicines  Follow your health care provider's instructions regarding medicine use. Specific medicines may be either safe or unsafe to take during pregnancy.  Take a prenatal vitamin that contains at least 600 micrograms (mcg) of folic acid.  If you develop constipation, try taking a stool softener if your health care provider approves. Eating and drinking  Eat a balanced diet that includes fresh fruits and vegetables, whole grains, good sources of protein such as meat, eggs, or tofu, and low-fat dairy. Your health care provider will help you determine the amount of weight gain that is right for you.  Avoid raw meat and uncooked cheese. These carry germs that can cause birth defects in the baby.  Eating four or five small meals rather than three large meals a day may help relieve nausea and vomiting. If you start to feel nauseous, eating a few soda crackers can be helpful. Drinking liquids between meals, instead of during meals, also seems to help ease nausea and vomiting.  Limit foods that are high in fat and processed sugars, such as fried and sweet foods.  To prevent constipation: ? Eat foods that are high in fiber, such as fresh fruits and vegetables, whole grains, and beans. ? Drink enough fluid to keep your urine clear or pale yellow. Activity  Exercise only as directed by your health care provider. Most women can continue their usual exercise routine during  pregnancy. Try to exercise for 30 minutes at least 5 days a week. Exercising will help you: ? Control your weight. ? Stay in shape. ? Be prepared for labor and delivery.  Experiencing pain or cramping in the lower abdomen or lower back is a good sign that you should stop exercising. Check with your health care provider before continuing with normal exercises.  Try to avoid standing for long periods of time. Move your legs often if you must stand in one place for a long time.  Avoid heavy lifting.  Wear low-heeled shoes and practice good posture.  You may continue to have sex unless your health care provider tells you not to. Relieving pain and discomfort  Wear a good support bra to relieve breast tenderness.  Take warm sitz baths to soothe any pain or discomfort caused by hemorrhoids. Use hemorrhoid cream if your health care provider approves.  Rest with your legs elevated if you have leg cramps or low back pain.  If you develop   varicose veins in your legs, wear support hose. Elevate your feet for 15 minutes, 3-4 times a day. Limit salt in your diet. Prenatal care  Schedule your prenatal visits by the twelfth week of pregnancy. They are usually scheduled monthly at first, then more often in the last 2 months before delivery.  Write down your questions. Take them to your prenatal visits.  Keep all your prenatal visits as told by your health care provider. This is important. Safety  Wear your seat belt at all times when driving.  Make a list of emergency phone numbers, including numbers for family, friends, the hospital, and police and fire departments. General instructions  Ask your health care provider for a referral to a local prenatal education class. Begin classes no later than the beginning of month 6 of your pregnancy.  Ask for help if you have counseling or nutritional needs during pregnancy. Your health care provider can offer advice or refer you to specialists for help  with various needs.  Do not use hot tubs, steam rooms, or saunas.  Do not douche or use tampons or scented sanitary pads.  Do not cross your legs for long periods of time.  Avoid cat litter boxes and soil used by cats. These carry germs that can cause birth defects in the baby and possibly loss of the fetus by miscarriage or stillbirth.  Avoid all smoking, herbs, alcohol, and medicines not prescribed by your health care provider. Chemicals in these products affect the formation and growth of the baby.  Do not use any products that contain nicotine or tobacco, such as cigarettes and e-cigarettes. If you need help quitting, ask your health care provider. You may receive counseling support and other resources to help you quit.  Schedule a dentist appointment. At home, brush your teeth with a soft toothbrush and be gentle when you floss. Contact a health care provider if:  You have dizziness.  You have mild pelvic cramps, pelvic pressure, or nagging pain in the abdominal area.  You have persistent nausea, vomiting, or diarrhea.  You have a bad smelling vaginal discharge.  You have pain when you urinate.  You notice increased swelling in your face, hands, legs, or ankles.  You are exposed to fifth disease or chickenpox.  You are exposed to German measles (rubella) and have never had it. Get help right away if:  You have a fever.  You are leaking fluid from your vagina.  You have spotting or bleeding from your vagina.  You have severe abdominal cramping or pain.  You have rapid weight gain or loss.  You vomit blood or material that looks like coffee grounds.  You develop a severe headache.  You have shortness of breath.  You have any kind of trauma, such as from a fall or a car accident. Summary  The first trimester of pregnancy is from week 1 until the end of week 13 (months 1 through 3).  Your body goes through many changes during pregnancy. The changes vary from  woman to woman.  You will have routine prenatal visits. During those visits, your health care provider will examine you, discuss any test results you may have, and talk with you about how you are feeling. This information is not intended to replace advice given to you by your health care provider. Make sure you discuss any questions you have with your health care provider. Document Released: 01/03/2001 Document Revised: 12/22/2015 Document Reviewed: 12/22/2015 Elsevier Interactive Patient Education  2018 [employer]   Inc[employer]  

## 2017-02-06 NOTE — Progress Notes (Signed)
Subjective:     Patient ID: [name], female   DOB: May 31, 1985, 32 y.o.   MRN: [id number]  HPI Ernst BowlerQuinta is a 32 year old black female in for UPT, has missed a period and had +HPT.   Review of Systems +missed period +HPT +nausea and vomiting +headache  +anxiety, and short of breath at times, used sons inhaler  Reviewed past medical,surgical, social and family history. Reviewed medications and allergies.     Objective:   Physical Exam BP 118/70 (BP Location: Right Arm, Patient Position: Sitting, Cuff Size: Small)   Pulse 94   Ht 5\' 2"  (1.575 m)   Wt 169 lb (76.7 kg)   LMP 12/14/2016 (Approximate)   BMI 30.91 kg/m UPT +, about 7+5 weeks by LMP with EDD 09/20/17.Skin warm and dry. Neck: mid line trachea, normal thyroid, good ROM, no lymphadenopathy noted. Lungs: clear to ausculation bilaterally. Cardiovascular: regular rate and rhythm. PHQ 9 score 5 denies being suicidal but very anxious, will rx lexapro.  She aware of after hours call service and [facility]     Assessment:     1. Pregnancy test positive   2. Less than [redacted] weeks gestation of pregnancy   3. Encounter to determine fetal viability of pregnancy, single or unspecified fetus   4. Anxiety   5. Nausea and vomiting during pregnancy prior to [redacted] weeks gestation       Plan:     Meds ordered this encounter  Medications  . prenatal vitamin w/FE, FA (PRENATAL 1 + 1) 27-1 MG TABS tablet    Sig: Take 1 tablet by mouth daily at 12 noon.    Dispense:  30 each    Refill:  0    Order Specific Question:   Supervising Provider    Answer:   Despina HiddenEURE, LUTHER H [[id number]]  . Doxylamine-Pyridoxine 10-10 MG TBEC    Sig: Take 2 at hs, 1 in am and 1 in afternoon    Dispense:  100 tablet    Refill:  3    Order Specific Question:   Supervising Provider    Answer:   Despina HiddenEURE, LUTHER H [[id number]]  . escitalopram (LEXAPRO) 10 MG tablet    Sig: Take 1 tablet (10 mg total) by mouth daily.    Dispense:  30 tablet    Refill:  12     Order Specific Question:   Supervising Provider    Answer:   Despina HiddenEURE, LUTHER H [[id number]]  . albuterol (PROVENTIL HFA;VENTOLIN HFA) 108 (90 Base) MCG/ACT inhaler    Sig: Inhale 2 puffs into the lungs every 6 (six) hours as needed for wheezing or shortness of breath.    Dispense:  18 g    Refill:  1    Order Specific Question:   Supervising Provider    Answer:   Duane LopeEURE, LUTHER H [[id number]]  Return in 1 week for dating US  Review handout on First trimester and by [facility]    OK to take tylenol, no Goodies

## 2017-02-13 ENCOUNTER — Other Ambulatory Visit: Payer: Medicaid Other

## 2017-02-19 ENCOUNTER — Other Ambulatory Visit: Payer: Medicaid Other

## 2017-02-20 ENCOUNTER — Ambulatory Visit (INDEPENDENT_AMBULATORY_CARE_PROVIDER_SITE_OTHER): Payer: Medicaid Other

## 2017-02-20 DIAGNOSIS — O3680X0 Pregnancy with inconclusive fetal viability, not applicable or unspecified: Secondary | ICD-10-CM

## 2017-02-20 DIAGNOSIS — N8311 Corpus luteum cyst of right ovary: Secondary | ICD-10-CM | POA: Diagnosis not present

## 2017-02-20 DIAGNOSIS — O3481 Maternal care for other abnormalities of pelvic organs, first trimester: Secondary | ICD-10-CM

## 2017-02-20 DIAGNOSIS — Z3A09 9 weeks gestation of pregnancy: Secondary | ICD-10-CM

## 2017-02-20 NOTE — Progress Notes (Signed)
US 9+5 wks,single IUP w/ys,positive fht 160 bpm,simple right corpus luteal cyst 2.1 x 2.2 x 1.8 cm,normal left ovary,crl 26.82 mm,EDD 09/20/2017 by LMP

## 2017-03-07 ENCOUNTER — Encounter: Payer: Self-pay | Admitting: Advanced Practice Midwife

## 2017-03-07 ENCOUNTER — Ambulatory Visit (INDEPENDENT_AMBULATORY_CARE_PROVIDER_SITE_OTHER): Payer: Medicaid Other | Admitting: Advanced Practice Midwife

## 2017-03-07 ENCOUNTER — Ambulatory Visit: Payer: Medicaid Other | Admitting: *Deleted

## 2017-03-07 VITALS — BP 124/88 | HR 80 | Wt 169.0 lb

## 2017-03-07 DIAGNOSIS — O99341 Other mental disorders complicating pregnancy, first trimester: Secondary | ICD-10-CM

## 2017-03-07 DIAGNOSIS — F419 Anxiety disorder, unspecified: Secondary | ICD-10-CM

## 2017-03-07 DIAGNOSIS — Z3A11 11 weeks gestation of pregnancy: Secondary | ICD-10-CM

## 2017-03-07 DIAGNOSIS — Z3481 Encounter for supervision of other normal pregnancy, first trimester: Secondary | ICD-10-CM

## 2017-03-07 DIAGNOSIS — Z3682 Encounter for antenatal screening for nuchal translucency: Secondary | ICD-10-CM

## 2017-03-07 DIAGNOSIS — Z1389 Encounter for screening for other disorder: Secondary | ICD-10-CM

## 2017-03-07 DIAGNOSIS — Z8742 Personal history of other diseases of the female genital tract: Secondary | ICD-10-CM

## 2017-03-07 DIAGNOSIS — F17210 Nicotine dependence, cigarettes, uncomplicated: Secondary | ICD-10-CM | POA: Diagnosis not present

## 2017-03-07 DIAGNOSIS — Z3480 Encounter for supervision of other normal pregnancy, unspecified trimester: Secondary | ICD-10-CM | POA: Insufficient documentation

## 2017-03-07 DIAGNOSIS — O99331 Smoking (tobacco) complicating pregnancy, first trimester: Secondary | ICD-10-CM | POA: Diagnosis not present

## 2017-03-07 DIAGNOSIS — Z331 Pregnant state, incidental: Secondary | ICD-10-CM

## 2017-03-07 DIAGNOSIS — Z3490 Encounter for supervision of normal pregnancy, unspecified, unspecified trimester: Secondary | ICD-10-CM | POA: Insufficient documentation

## 2017-03-07 LAB — POCT URINALYSIS DIPSTICK
GLUCOSE UA: NEGATIVE
KETONES UA: NEGATIVE
Leukocytes, UA: NEGATIVE
Nitrite, UA: NEGATIVE
Protein, UA: NEGATIVE
RBC UA: NEGATIVE

## 2017-03-07 MED ORDER — HYDROXYZINE HCL 25 MG PO TABS: 25.0000 mg | ORAL_TABLET | Freq: Three times a day (TID) | ORAL | 3 refills | Status: DC | PRN

## 2017-03-07 NOTE — Patient Instructions (Signed)
 First Trimester of Pregnancy The first trimester of pregnancy is from week 1 until the end of week 12 (months 1 through 3). A week after a sperm fertilizes an egg, the egg will implant on the wall of the uterus. This embryo will begin to develop into a baby. Genes from you and your partner are forming the baby. The female genes determine whether the baby is a boy or a girl. At 6-8 weeks, the eyes and face are formed, and the heartbeat can be seen on ultrasound. At the end of 12 weeks, all the baby's organs are formed.  Now that you are pregnant, you will want to do everything you can to have a healthy baby. Two of the most important things are to get good prenatal care and to follow your health care provider's instructions. Prenatal care is all the medical care you receive before the baby's birth. This care will help prevent, find, and treat any problems during the pregnancy and childbirth. BODY CHANGES Your body goes through many changes during pregnancy. The changes vary from woman to woman.   You may gain or lose a couple of pounds at first.  You may feel sick to your stomach (nauseous) and throw up (vomit). If the vomiting is uncontrollable, call your health care provider.  You may tire easily.  You may develop headaches that can be relieved by medicines approved by your health care provider.  You may urinate more often. Painful urination may mean you have a bladder infection.  You may develop heartburn as a result of your pregnancy.  You may develop constipation because certain hormones are causing the muscles that push waste through your intestines to slow down.  You may develop hemorrhoids or swollen, bulging veins (varicose veins).  Your breasts may begin to grow larger and become tender. Your nipples may stick out more, and the tissue that surrounds them (areola) may become darker.  Your gums may bleed and may be sensitive to brushing and flossing.  Dark spots or blotches  (chloasma, mask of pregnancy) may develop on your face. This will likely fade after the baby is born.  Your menstrual periods will stop.  You may have a loss of appetite.  You may develop cravings for certain kinds of food.  You may have changes in your emotions from day to day, such as being excited to be pregnant or being concerned that something may go wrong with the pregnancy and baby.  You may have more vivid and strange dreams.  You may have changes in your hair. These can include thickening of your hair, rapid growth, and changes in texture. Some women also have hair loss during or after pregnancy, or hair that feels dry or thin. Your hair will most likely return to normal after your baby is born. WHAT TO EXPECT AT YOUR PRENATAL VISITS During a routine prenatal visit:  You will be weighed to make sure you and the baby are growing normally.  Your blood pressure will be taken.  Your abdomen will be measured to track your baby's growth.  The fetal heartbeat will be listened to starting around week 10 or 12 of your pregnancy.  Test results from any previous visits will be discussed. Your health care provider may ask you:  How you are feeling.  If you are feeling the baby move.  If you have had any abnormal symptoms, such as leaking fluid, bleeding, severe headaches, or abdominal cramping.  If you have any questions. Other   tests that may be performed during your first trimester include:  Blood tests to find your blood type and to check for the presence of any previous infections. They will also be used to check for low iron levels (anemia) and Rh antibodies. Later in the pregnancy, blood tests for diabetes will be done along with other tests if problems develop.  Urine tests to check for infections, diabetes, or protein in the urine.  An ultrasound to confirm the proper growth and development of the baby.  An amniocentesis to check for possible genetic problems.  Fetal  screens for spina bifida and Down syndrome.  You may need other tests to make sure you and the baby are doing well. HOME CARE INSTRUCTIONS  Medicines  Follow your health care provider's instructions regarding medicine use. Specific medicines may be either safe or unsafe to take during pregnancy.  Take your prenatal vitamins as directed.  If you develop constipation, try taking a stool softener if your health care provider approves. Diet  Eat regular, well-balanced meals. Choose a variety of foods, such as meat or vegetable-based protein, fish, milk and low-fat dairy products, vegetables, fruits, and whole grain breads and cereals. Your health care provider will help you determine the amount of weight gain that is right for you.  Avoid raw meat and uncooked cheese. These carry germs that can cause birth defects in the baby.  Eating four or five small meals rather than three large meals a day may help relieve nausea and vomiting. If you start to feel nauseous, eating a few soda crackers can be helpful. Drinking liquids between meals instead of during meals also seems to help nausea and vomiting.  If you develop constipation, eat more high-fiber foods, such as fresh vegetables or fruit and whole grains. Drink enough fluids to keep your urine clear or pale yellow. Activity and Exercise  Exercise only as directed by your health care provider. Exercising will help you:  Control your weight.  Stay in shape.  Be prepared for labor and delivery.  Experiencing pain or cramping in the lower abdomen or low back is a good sign that you should stop exercising. Check with your health care provider before continuing normal exercises.  Try to avoid standing for long periods of time. Move your legs often if you must stand in one place for a long time.  Avoid heavy lifting.  Wear low-heeled shoes, and practice good posture.  You may continue to have sex unless your health care provider directs you  otherwise. Relief of Pain or Discomfort  Wear a good support bra for breast tenderness.   Take warm sitz baths to soothe any pain or discomfort caused by hemorrhoids. Use hemorrhoid cream if your health care provider approves.   Rest with your legs elevated if you have leg cramps or low back pain.  If you develop varicose veins in your legs, wear support hose. Elevate your feet for 15 minutes, 3-4 times a day. Limit salt in your diet. Prenatal Care  Schedule your prenatal visits by the twelfth week of pregnancy. They are usually scheduled monthly at first, then more often in the last 2 months before delivery.  Write down your questions. Take them to your prenatal visits.  Keep all your prenatal visits as directed by your health care provider. Safety  Wear your seat belt at all times when driving.  Make a list of emergency phone numbers, including numbers for family, friends, the hospital, and police and fire departments. General   Tips  Ask your health care provider for a referral to a local prenatal education class. Begin classes no later than at the beginning of month 6 of your pregnancy.  Ask for help if you have counseling or nutritional needs during pregnancy. Your health care provider can offer advice or refer you to specialists for help with various needs.  Do not use hot tubs, steam rooms, or saunas.  Do not douche or use tampons or scented sanitary pads.  Do not cross your legs for long periods of time.  Avoid cat litter boxes and soil used by cats. These carry germs that can cause birth defects in the baby and possibly loss of the fetus by miscarriage or stillbirth.  Avoid all smoking, herbs, alcohol, and medicines not prescribed by your health care provider. Chemicals in these affect the formation and growth of the baby.  Schedule a dentist appointment. At home, brush your teeth with a soft toothbrush and be gentle when you floss. SEEK MEDICAL CARE IF:   You have  dizziness.  You have mild pelvic cramps, pelvic pressure, or nagging pain in the abdominal area.  You have persistent nausea, vomiting, or diarrhea.  You have a bad smelling vaginal discharge.  You have pain with urination.  You notice increased swelling in your face, hands, legs, or ankles. SEEK IMMEDIATE MEDICAL CARE IF:   You have a fever.  You are leaking fluid from your vagina.  You have spotting or bleeding from your vagina.  You have severe abdominal cramping or pain.  You have rapid weight gain or loss.  You vomit blood or material that looks like coffee grounds.  You are exposed to German measles and have never had them.  You are exposed to fifth disease or chickenpox.  You develop a severe headache.  You have shortness of breath.  You have any kind of trauma, such as from a fall or a car accident. Document Released: 01/03/2001 Document Revised: 05/26/2013 Document Reviewed: 11/19/2012 Exit[employer] Patient Information 2015 [employer]. This information is not intended to replace advice given to you by your health care provider. Make sure you discuss any questions you have with your health care provider.   Nausea & Vomiting  Have saltine crackers or pretzels by your bed and eat a few bites before you raise your head out of bed in the morning  Eat small frequent meals throughout the day instead of large meals  Drink plenty of fluids throughout the day to stay hydrated, just don't drink a lot of fluids with your meals.  This can make your stomach fill up faster making you feel sick  Do not brush your teeth right after you eat  Products with real ginger are good for nausea, like ginger ale and ginger hard candy Make sure it says made with real ginger!  Sucking on sour candy like lemon heads is also good for nausea  If your prenatal vitamins make you nauseated, take them at night so you will sleep through the nausea  Sea Bands  If you feel like you need  medicine for the nausea & vomiting please let us know  If you are unable to keep any fluids or food down please let us know   Constipation  Drink plenty of fluid, preferably water, throughout the day  Eat foods high in fiber such as fruits, vegetables, and grains  Exercise, such as walking, is a good way to keep your bowels regular  Drink warm fluids, especially warm   prune juice, or decaf coffee  Eat a 1/2 cup of real oatmeal (not instant), 1/2 cup applesauce, and 1/2-1 cup warm prune juice every day  If needed, you may take Colace (docusate sodium) stool softener once or twice a day to help keep the stool soft. If you are pregnant, wait until you are out of your first trimester (12-14 weeks of pregnancy)  If you still are having problems with constipation, you may take Miralax once daily as needed to help keep your bowels regular.  If you are pregnant, wait until you are out of your first trimester (12-14 weeks of pregnancy)  Safe Medications in Pregnancy   Acne: Benzoyl Peroxide Salicylic Acid  Backache/Headache: Tylenol: 2 regular strength every 4 hours OR              2 Extra strength every 6 hours  Colds/Coughs/Allergies: Benadryl (alcohol free) 25 mg every 6 hours as needed Breath right strips Claritin Cepacol throat lozenges Chloraseptic throat spray Cold-Eeze- up to three times per day Cough drops, alcohol free Flonase (by prescription only) Guaifenesin Mucinex Robitussin DM (plain only, alcohol free) Saline nasal spray/drops Sudafed (pseudoephedrine) & Actifed ** use only after [redacted] weeks gestation and if you do not have high blood pressure Tylenol Vicks Vaporub Zinc lozenges Zyrtec   Constipation: Colace Ducolax suppositories Fleet enema Glycerin suppositories Metamucil Milk of magnesia Miralax Senokot Smooth move tea  Diarrhea: Kaopectate Imodium A-D  *NO pepto Bismol  Hemorrhoids: Anusol Anusol HC Preparation  H Tucks  Indigestion: Tums Maalox Mylanta Zantac  Pepcid  Insomnia: Benadryl (alcohol free) 25mg every 6 hours as needed Tylenol PM Unisom, no Gelcaps  Leg Cramps: Tums MagGel  Nausea/Vomiting:  Bonine Dramamine Emetrol Ginger extract Sea bands Meclizine  Nausea medication to take during pregnancy:  Unisom (doxylamine succinate 25 mg tablets) Take one tablet daily at bedtime. If symptoms are not adequately controlled, the dose can be increased to a maximum recommended dose of two tablets daily (1/2 tablet in the morning, 1/2 tablet mid-afternoon and one at bedtime). Vitamin B6 100mg tablets. Take one tablet twice a day (up to 200 mg per day).  Skin Rashes: Aveeno products Benadryl cream or 25mg every 6 hours as needed Calamine Lotion 1% cortisone cream  Yeast infection: Gyne-lotrimin 7 Monistat 7   **If taking multiple medications, please check labels to avoid duplicating the same active ingredients **take medication as directed on the label ** Do not exceed 4000 mg of tylenol in 24 hours **Do not take medications that contain aspirin or ibuprofen      

## 2017-03-07 NOTE — Progress Notes (Signed)
Subjective:    [name] is a [id number] being seen today for her first obstetrical visit.  Her obstetrical history is significant for term SVD in 2009, no complications.  .  Pregnancy history fully reviewed. Has had several TABs and 2 early SABs.  Given Rx for lexapro for anxiety a few weeks ago.Thought lexapro was for prn use, doesn't want it now.  Has taken vistaril in the past, wants a rx for prn use.  Declines therapy referral . Uses inhaler a few times a week.  Apt is dry and hot, feels stuffy a lot. Plans to get humidifier.  .  Vitals:   03/07/17 1539  BP: 124/88  Pulse: 80  Weight: 169 lb (76.7 kg)    HISTORY: OB History  Gravida Para Term Preterm AB Living  7 1 1   5 1   SAB TAB Ectopic Multiple Live Births  2 3     1     # Outcome Date GA Lbr Len/2nd Weight Sex Delivery Anes PTL Lv  7 Current           6 TAB 07/2016          5 TAB 06/2015          4 SAB 02/2014          3 SAB 2015          2 TAB 10/2012          1 Term 01/11/08 2746w5d  7 lb 10 oz (3.459 kg) M Vag-Spont EPI N LIV     Past Medical History:  Diagnosis Date  . Abnormal pap 10/2011  . Abnormal Pap smear   . Abortion   . Anxiety   . Anxiety   . Asthma   . Back pain 11/18/2012  . History of abnormal cervical Pap smear 01/13/2014  . History of bronchitis   . History of chlamydia   . History of gonorrhea   . History of trichomoniasis   . Mental disorder    anxiety  . Nausea and vomiting 11/18/2012  . Nausea and vomiting during pregnancy 06/11/2015  . Pregnant 06/11/2015  . Urge incontinence 01/13/2014  . Vaginal Pap smear, abnormal    Past Surgical History:  Procedure Laterality Date  . ELECTIVE ABORTION    . MOUTH SURGERY     Family History  Problem Relation Age of Onset  . Cancer Maternal Aunt        breast  . Diabetes Maternal Grandmother   . Hypertension Maternal Grandmother   . Hypertension Maternal Grandfather   . Schizophrenia Brother   . Seizures Brother   . Asthma  Son   . Fibroids Mother        fibroid tumor in uterus     Exam       :                               System:     Skin: normal coloration and turgor, no rashes    Neurologic: oriented, normal, normal mood   Extremities: normal strength, tone, and muscle mass   HEENT PERRLA   Mouth/Teeth mucous membranes moist, normla dentition   Neck supple and no masses   Cardiovascular: regular rate and rhythm   Respiratory:  appears well, vitals normal, no respiratory distress, acyanotic   Abdomen: soft, non-tender;  FHR: 160 US        The nature of  [facility] with multiple MDs and other Advanced Practice Providers was explained to patient; also emphasized that residents, students are part of our team.  Assessment:    Pregnancy: [id number] Patient Active Problem List   Diagnosis Date Noted  . Supervision of normal pregnancy 03/07/2017  . Smoker 06/30/2015  . Anxiety 06/30/2015  . Urge incontinence 01/13/2014  . History of abnormal cervical Pap smear 01/13/2014  . PTSD (post-traumatic stress disorder) 07/31/2013  . History of chlamydia 05/27/2012  . History of trichomoniasis 05/27/2012        Plan:     Initial labs ordered, will draw next week w/NT/IT (pt in a hurry) Smoking reduction strategies discussed,  Continue prenatal vitamins  Problem list reviewed and updated  Reviewed n/v relief measures and warning s/s to report  Reviewed recommended weight gain based on pre-gravid BMI  Encouraged well-balanced diet Genetic Screening discussed Integrated Screen: requested.  Ultrasound discussed; fetal survey: requested.  Return in about 1 week (around 03/14/2017) for US:NT+1st IT; 4 weeks for LROB.  Jacklyn Shell 03/07/2017

## 2017-03-08 LAB — GC/CHLAMYDIA PROBE AMP
Chlamydia trachomatis, NAA: NEGATIVE
Neisseria gonorrhoeae by PCR: NEGATIVE

## 2017-03-14 ENCOUNTER — Telehealth: Payer: Self-pay | Admitting: *Deleted

## 2017-03-14 NOTE — Telephone Encounter (Signed)
[facility] had some stuffiness and also is wanting to drink red wine. Pt hasn't had new OB appt yet but it will be advised that we don't recommend alcohol during pregnancy. JSY

## 2017-03-15 ENCOUNTER — Other Ambulatory Visit: Payer: Medicaid Other

## 2017-03-15 ENCOUNTER — Ambulatory Visit (INDEPENDENT_AMBULATORY_CARE_PROVIDER_SITE_OTHER): Payer: Medicaid Other

## 2017-03-15 DIAGNOSIS — Z3682 Encounter for antenatal screening for nuchal translucency: Secondary | ICD-10-CM

## 2017-03-15 DIAGNOSIS — Z3402 Encounter for supervision of normal first pregnancy, second trimester: Secondary | ICD-10-CM

## 2017-03-15 NOTE — Progress Notes (Signed)
US 13 wks,measurement c/w dates,crl 66.68 mm,normal ovaries bilat,NB present,NT 1.4 mm,fhr 151 bpm,posterior pl gr 0

## 2017-03-20 LAB — INTEGRATED 1
Crown Rump Length: 66.7 mm
GEST. AGE ON COLLECTION DATE: 12.7 wk
MATERNAL AGE AT EDD: 31 a
NUCHAL TRANSLUCENCY (NT): 1.4 mm
NUMBER OF FETUSES: 1
PAPP-A Value: 996.5 ng/mL
Weight: 167 [lb_av]

## 2017-03-22 LAB — CBC
Hematocrit: 34.8 % (ref 34.0–46.6)
Hemoglobin: 12 g/dL (ref 11.1–15.9)
MCH: 31.1 pg (ref 26.6–33.0)
MCHC: 34.5 g/dL (ref 31.5–35.7)
MCV: 90 fL (ref 79–97)
PLATELETS: 253 10*3/uL (ref 150–379)
RBC: 3.86 x10E6/uL (ref 3.77–5.28)
RDW: 13.8 % (ref 12.3–15.4)
WBC: 5.5 10*3/uL (ref 3.4–10.8)

## 2017-03-22 LAB — HEPATITIS B SURFACE ANTIGEN: HEP B S AG: NEGATIVE

## 2017-03-22 LAB — RUBELLA SCREEN: Rubella Antibodies, IGG: 6.16 index (ref >0.99)

## 2017-03-22 LAB — SICKLE CELL SCREEN: Sickle Cell Screen: NEGATIVE

## 2017-03-22 LAB — CYSTIC FIBROSIS MUTATION 97: Interpretation: NOT DETECTED

## 2017-03-22 LAB — URINALYSIS, ROUTINE W REFLEX MICROSCOPIC

## 2017-03-22 LAB — ANTIBODY SCREEN: ANTIBODY SCREEN: NEGATIVE

## 2017-03-22 LAB — RPR: RPR: NONREACTIVE

## 2017-03-22 LAB — HIV ANTIBODY (ROUTINE TESTING W REFLEX): HIV SCREEN 4TH GENERATION: NONREACTIVE

## 2017-04-04 ENCOUNTER — Ambulatory Visit (INDEPENDENT_AMBULATORY_CARE_PROVIDER_SITE_OTHER): Payer: Medicaid Other | Admitting: Advanced Practice Midwife

## 2017-04-04 ENCOUNTER — Encounter: Payer: Self-pay | Admitting: Advanced Practice Midwife

## 2017-04-04 VITALS — BP 110/62 | HR 89 | Wt 170.0 lb

## 2017-04-04 DIAGNOSIS — Z1389 Encounter for screening for other disorder: Secondary | ICD-10-CM

## 2017-04-04 DIAGNOSIS — Z3482 Encounter for supervision of other normal pregnancy, second trimester: Secondary | ICD-10-CM

## 2017-04-04 DIAGNOSIS — Z363 Encounter for antenatal screening for malformations: Secondary | ICD-10-CM

## 2017-04-04 DIAGNOSIS — Z3A15 15 weeks gestation of pregnancy: Secondary | ICD-10-CM

## 2017-04-04 DIAGNOSIS — Z331 Pregnant state, incidental: Secondary | ICD-10-CM

## 2017-04-04 LAB — POCT URINALYSIS DIPSTICK
Glucose, UA: NEGATIVE
Ketones, UA: NEGATIVE
Nitrite, UA: NEGATIVE
Protein, UA: NEGATIVE
RBC UA: NEGATIVE

## 2017-04-04 MED ORDER — OMEPRAZOLE 20 MG PO CPDR: 20.0000 mg | DELAYED_RELEASE_CAPSULE | Freq: Every day | ORAL | 6 refills | Status: DC

## 2017-04-04 NOTE — Progress Notes (Signed)
  [id number] 1144w6d Estimated Date of Delivery: 09/20/17  Blood pressure 110/62, pulse 89, weight 170 lb (77.1 kg), last menstrual period 12/14/2016, unknown if currently breastfeeding.   BP weight and urine results all reviewed and noted.  Please refer to the obstetrical flow sheet for the fundal height and fetal heart rate documentation:  Patient reports good fetal movement, denies any bleeding and no rupture of membranes symptoms or regular contractions. Patient is without complaints OTHER THAN HEARTBURN All questions were answered.   Physical Assessment:   Vitals:   04/04/17 1546  BP: 110/62  Pulse: 89  Weight: 170 lb (77.1 kg)  Body mass index is 31.09 kg/m.        Physical Examination:   General appearance: Well appearing, and in no distress  Mental status: Alert, oriented to person, place, and time  Skin: Warm & dry  Cardiovascular: Normal heart rate noted  Respiratory: Normal respiratory effort, no distress  Abdomen: Soft, gravid, nontender  Pelvic: Cervical exam deferred         Extremities: Edema: None  Fetal Status: Fetal Heart Rate (bpm): 150        Results for orders placed or performed in visit on 04/04/17 (from the past 24 hour(s))  POCT urinalysis dipstick   Collection Time: 04/04/17  3:47 PM  Result Value Ref Range   Color, UA     Clarity, UA     Glucose, UA neg    Bilirubin, UA     Ketones, UA neg    Spec Grav, UA  1.010 - 1.025   Blood, UA neg    pH, UA  5.0 - 8.0   Protein, UA neg    Urobilinogen, UA  0.2 or 1.0 E.U./dL   Nitrite, UA neg    Leukocytes, UA Small (1+) (A) Negative   Appearance     Odor       Orders Placed This Encounter  Procedures  . US OB Comp + 14 Wk  . POCT urinalysis dipstick    Plan:  Continued routine obstetrical care, didn't want 2nd IT today   Rx Prilosec  Return in about 2 weeks (around 04/19/2017) for LROB, JX:[id number]:Anatomy, 2nd IT. '

## 2017-04-04 NOTE — Patient Instructions (Signed)
[name], I greatly value your feedback.  If you receive a survey following your visit with us today, we appreciate you taking the time to fill it out.  Thanks, Cathie BeamsFran Cresenzo-Dishmon, CNM     Second Trimester of Pregnancy The second trimester is from week 14 through week 27 (months 4 through 6). The second trimester is often a time when you feel your best. Your body has adjusted to being pregnant, and you begin to feel better physically. Usually, morning sickness has lessened or quit completely, you may have more energy, and you may have an increase in appetite. The second trimester is also a time when the fetus is growing rapidly. At the end of the sixth month, the fetus is about 9 inches long and weighs about 1 pounds. You will likely begin to feel the baby move (quickening) between 16 and 20 weeks of pregnancy. Body changes during your second trimester Your body continues to go through many changes during your second trimester. The changes vary from woman to woman.  Your weight will continue to increase. You will notice your lower abdomen bulging out.  You may begin to get stretch marks on your hips, abdomen, and breasts.  You may develop headaches that can be relieved by medicines. The medicines should be approved by your health care provider.  You may urinate more often because the fetus is pressing on your bladder.  You may develop or continue to have heartburn as a result of your pregnancy.  You may develop constipation because certain hormones are causing the muscles that push waste through your intestines to slow down.  You may develop hemorrhoids or swollen, bulging veins (varicose veins).  You may have back pain. This is caused by: ? Weight gain. ? Pregnancy hormones that are relaxing the joints in your pelvis. ? A shift in weight and the muscles that support your balance.  Your breasts will continue to grow and they will continue to become tender.  Your gums  may bleed and may be sensitive to brushing and flossing.  Dark spots or blotches (chloasma, mask of pregnancy) may develop on your face. This will likely fade after the baby is born.  A dark line from your belly button to the pubic area (linea nigra) may appear. This will likely fade after the baby is born.  You may have changes in your hair. These can include thickening of your hair, rapid growth, and changes in texture. Some women also have hair loss during or after pregnancy, or hair that feels dry or thin. Your hair will most likely return to normal after your baby is born.  What to expect at prenatal visits During a routine prenatal visit:  You will be weighed to make sure you and the fetus are growing normally.  Your blood pressure will be taken.  Your abdomen will be measured to track your baby's growth.  The fetal heartbeat will be listened to.  Any test results from the previous visit will be discussed.  Your health care provider may ask you:  How you are feeling.  If you are feeling the baby move.  If you have had any abnormal symptoms, such as leaking fluid, bleeding, severe headaches, or abdominal cramping.  If you are using any tobacco products, including cigarettes, chewing tobacco, and electronic cigarettes.  If you have any questions.  Other tests that may be performed during your second trimester include:  Blood tests that check for: ? Low iron levels (anemia). ?  High blood sugar that affects pregnant women (gestational diabetes) between 20 and 28 weeks. ? Rh antibodies. This is to check for a protein on red blood cells (Rh factor).  Urine tests to check for infections, diabetes, or protein in the urine.  An ultrasound to confirm the proper growth and development of the baby.  An amniocentesis to check for possible genetic problems.  Fetal screens for spina bifida and Down syndrome.  HIV (human immunodeficiency virus) testing. Routine prenatal testing  includes screening for HIV, unless you choose not to have this test.  Follow these instructions at home: Medicines  Follow your health care provider's instructions regarding medicine use. Specific medicines may be either safe or unsafe to take during pregnancy.  Take a prenatal vitamin that contains at least 600 micrograms (mcg) of folic acid.  If you develop constipation, try taking a stool softener if your health care provider approves. Eating and drinking  Eat a balanced diet that includes fresh fruits and vegetables, whole grains, good sources of protein such as meat, eggs, or tofu, and low-fat dairy. Your health care provider will help you determine the amount of weight gain that is right for you.  Avoid raw meat and uncooked cheese. These carry germs that can cause birth defects in the baby.  If you have low calcium intake from food, talk to your health care provider about whether you should take a daily calcium supplement.  Limit foods that are high in fat and processed sugars, such as fried and sweet foods.  To prevent constipation: ? Drink enough fluid to keep your urine clear or pale yellow. ? Eat foods that are high in fiber, such as fresh fruits and vegetables, whole grains, and beans. Activity  Exercise only as directed by your health care provider. Most women can continue their usual exercise routine during pregnancy. Try to exercise for 30 minutes at least 5 days a week. Stop exercising if you experience uterine contractions.  Avoid heavy lifting, wear low heel shoes, and practice good posture.  A sexual relationship may be continued unless your health care provider directs you otherwise. Relieving pain and discomfort  Wear a good support bra to prevent discomfort from breast tenderness.  Take warm sitz baths to soothe any pain or discomfort caused by hemorrhoids. Use hemorrhoid cream if your health care provider approves.  Rest with your legs elevated if you have  leg cramps or low back pain.  If you develop varicose veins, wear support hose. Elevate your feet for 15 minutes, 3-4 times a day. Limit salt in your diet. Prenatal Care  Write down your questions. Take them to your prenatal visits.  Keep all your prenatal visits as told by your health care provider. This is important. Safety  Wear your seat belt at all times when driving.  Make a list of emergency phone numbers, including numbers for family, friends, the hospital, and police and fire departments. General instructions  Ask your health care provider for a referral to a local prenatal education class. Begin classes no later than the beginning of month 6 of your pregnancy.  Ask for help if you have counseling or nutritional needs during pregnancy. Your health care provider can offer advice or refer you to specialists for help with various needs.  Do not use hot tubs, steam rooms, or saunas.  Do not douche or use tampons or scented sanitary pads.  Do not cross your legs for long periods of time.  Avoid cat litter boxes and  soil used by cats. These carry germs that can cause birth defects in the baby and possibly loss of the fetus by miscarriage or stillbirth.  Avoid all smoking, herbs, alcohol, and unprescribed drugs. Chemicals in these products can affect the formation and growth of the baby.  Do not use any products that contain nicotine or tobacco, such as cigarettes and e-cigarettes. If you need help quitting, ask your health care provider.  Visit your dentist if you have not gone yet during your pregnancy. Use a soft toothbrush to brush your teeth and be gentle when you floss. Contact a health care provider if:  You have dizziness.  You have mild pelvic cramps, pelvic pressure, or nagging pain in the abdominal area.  You have persistent nausea, vomiting, or diarrhea.  You have a bad smelling vaginal discharge.  You have pain when you urinate. Get help right away if:  You  have a fever.  You are leaking fluid from your vagina.  You have spotting or bleeding from your vagina.  You have severe abdominal cramping or pain.  You have rapid weight gain or weight loss.  You have shortness of breath with chest pain.  You notice sudden or extreme swelling of your face, hands, ankles, feet, or legs.  You have not felt your baby move in over an hour.  You have severe headaches that do not go away when you take medicine.  You have vision changes. Summary  The second trimester is from week 14 through week 27 (months 4 through 6). It is also a time when the fetus is growing rapidly.  Your body goes through many changes during pregnancy. The changes vary from woman to woman.  Avoid all smoking, herbs, alcohol, and unprescribed drugs. These chemicals affect the formation and growth your baby.  Do not use any tobacco products, such as cigarettes, chewing tobacco, and e-cigarettes. If you need help quitting, ask your health care provider.  Contact your health care provider if you have any questions. Keep all prenatal visits as told by your health care provider. This is important. This information is not intended to replace advice given to you by your health care provider. Make sure you discuss any questions you have with your health care provider.      CHILDBIRTH CLASSES [phone] is the phone number for Pregnancy Classes or hospital tours at [place] will be referred to  [email or web address] for more information on childbirth classes  At this site you may register for classes. You may sign up for a waiting list if classes are full. Please SIGN UP FOR THIS!.   When the waiting list becomes long, sometimes new classes can be added.

## 2017-04-18 ENCOUNTER — Other Ambulatory Visit: Payer: Medicaid Other

## 2017-04-18 ENCOUNTER — Encounter: Payer: Medicaid Other | Admitting: Obstetrics and Gynecology

## 2017-04-26 ENCOUNTER — Encounter: Payer: Medicaid Other | Admitting: Women's Health

## 2017-04-26 ENCOUNTER — Other Ambulatory Visit: Payer: Medicaid Other

## 2017-04-27 ENCOUNTER — Ambulatory Visit (INDEPENDENT_AMBULATORY_CARE_PROVIDER_SITE_OTHER): Payer: Medicaid Other

## 2017-04-27 DIAGNOSIS — Z363 Encounter for antenatal screening for malformations: Secondary | ICD-10-CM

## 2017-04-27 DIAGNOSIS — Z3402 Encounter for supervision of normal first pregnancy, second trimester: Secondary | ICD-10-CM

## 2017-04-27 DIAGNOSIS — Z3482 Encounter for supervision of other normal pregnancy, second trimester: Secondary | ICD-10-CM

## 2017-04-30 NOTE — Progress Notes (Signed)
US 19+1 wks,cephalic,svp of fluid 5.4 cm,normal ovaries bilat,posterior pl gr 0,cx 3.9 cm,fhr 146 bpm,efw 259 g 27%,anatomy complete,no obvious abnormalities

## 2017-05-02 ENCOUNTER — Encounter: Payer: Medicaid Other | Admitting: Obstetrics and Gynecology

## 2017-05-09 ENCOUNTER — Ambulatory Visit (INDEPENDENT_AMBULATORY_CARE_PROVIDER_SITE_OTHER): Payer: Medicaid Other | Admitting: Advanced Practice Midwife

## 2017-05-09 ENCOUNTER — Encounter: Payer: Self-pay | Admitting: Advanced Practice Midwife

## 2017-05-09 VITALS — BP 124/70 | HR 82 | Wt 172.0 lb

## 2017-05-09 DIAGNOSIS — O23592 Infection of other part of genital tract in pregnancy, second trimester: Secondary | ICD-10-CM | POA: Diagnosis not present

## 2017-05-09 DIAGNOSIS — Z1379 Encounter for other screening for genetic and chromosomal anomalies: Secondary | ICD-10-CM

## 2017-05-09 DIAGNOSIS — A5901 Trichomonal vulvovaginitis: Secondary | ICD-10-CM | POA: Insufficient documentation

## 2017-05-09 DIAGNOSIS — Z3482 Encounter for supervision of other normal pregnancy, second trimester: Secondary | ICD-10-CM

## 2017-05-09 DIAGNOSIS — Z3A20 20 weeks gestation of pregnancy: Secondary | ICD-10-CM

## 2017-05-09 DIAGNOSIS — Z1389 Encounter for screening for other disorder: Secondary | ICD-10-CM

## 2017-05-09 DIAGNOSIS — Z331 Pregnant state, incidental: Secondary | ICD-10-CM

## 2017-05-09 LAB — POCT URINALYSIS DIPSTICK
Blood, UA: NEGATIVE
Glucose, UA: NEGATIVE
KETONES UA: NEGATIVE
NITRITE UA: NEGATIVE
Protein, UA: NEGATIVE

## 2017-05-09 MED ORDER — METRONIDAZOLE 500 MG PO TABS: 500.0000 mg | ORAL_TABLET | Freq: Two times a day (BID) | ORAL | 0 refills | Status: AC

## 2017-05-09 NOTE — Progress Notes (Signed)
  [id number] Estimated Date of Delivery: 09/20/17  Blood pressure 124/70, pulse 82, weight 172 lb (78 kg), last menstrual period 12/14/2016, unknown if currently breastfeeding.   BP weight and urine results all reviewed and noted.  Please refer to the obstetrical flow sheet for the fundal height and fetal heart rate documentation:  Patient reports good fetal movement, denies any bleeding and no rupture of membranes symptoms or regular contractions. Patient c/o vaginal dishcharge and itching for about 4 days.  Has not tried any OTC remedies.  All questions were answered.   Physical Assessment:   Vitals:   05/09/17 1419  BP: 124/70  Pulse: 82  Weight: 172 lb (78 kg)  Body mass index is 31.46 kg/m.        Physical Examination:   General appearance: Well appearing, and in no distress  Mental status: Alert, oriented to person, place, and time  Skin: Warm & dry  Cardiovascular: Normal heart rate noted  Respiratory: Normal respiratory effort, no distress  Abdomen: Soft, gravid, nontender  Pelvic: Cervical exam deferred SSE:  Thin yellow frothy discharge w/o odor. Wet prep + clue/trich        Extremities: Edema: None  Fetal Status: Fetal Heart Rate (bpm): 147   Movement: Present    Results for orders placed or performed in visit on 05/09/17 (from the past 24 hour(s))  POCT urinalysis dipstick   Collection Time: 05/09/17  2:26 PM  Result Value Ref Range   Color, UA     Clarity, UA     Glucose, UA neg    Bilirubin, UA     Ketones, UA neg    Spec Grav, UA  1.010 - 1.025   Blood, UA neg    pH, UA  5.0 - 8.0   Protein, UA neg    Urobilinogen, UA  0.2 or 1.0 E.U./dL   Nitrite, UA neg    Leukocytes, UA Small (1+) (A) Negative   Appearance     Odor       Orders Placed This Encounter  Procedures  . INTEGRATED 2  . POCT urinalysis dipstick    Plan:  Continued routine obstetrical care  2nd IT today (pt cancelled last 3 appointments, so today is 1st day we have been able to  get it).  Rx Flagyl 500mg  BID X 7; advised that partner must also be treated.  No unprotected intercourse until POC next visit Return in about 1 month (around 06/06/2017) for LROB.

## 2017-05-09 NOTE — Patient Instructions (Addendum)
[name], I greatly value your feedback.  If you receive a survey following your visit with us today, we appreciate you taking the time to fill it out.  Thanks, Cathie BeamsFran Cresenzo-Dishmon, CNM     Second Trimester of Pregnancy The second trimester is from week 14 through week 27 (months 4 through 6). The second trimester is often a time when you feel your best. Your body has adjusted to being pregnant, and you begin to feel better physically. Usually, morning sickness has lessened or quit completely, you may have more energy, and you may have an increase in appetite. The second trimester is also a time when the fetus is growing rapidly. At the end of the sixth month, the fetus is about 9 inches long and weighs about 1 pounds. You will likely begin to feel the baby move (quickening) between 16 and 20 weeks of pregnancy. Body changes during your second trimester Your body continues to go through many changes during your second trimester. The changes vary from woman to woman.  Your weight will continue to increase. You will notice your lower abdomen bulging out.  You may begin to get stretch marks on your hips, abdomen, and breasts.  You may develop headaches that can be relieved by medicines. The medicines should be approved by your health care provider.  You may urinate more often because the fetus is pressing on your bladder.  You may develop or continue to have heartburn as a result of your pregnancy.  You may develop constipation because certain hormones are causing the muscles that push waste through your intestines to slow down.  You may develop hemorrhoids or swollen, bulging veins (varicose veins).  You may have back pain. This is caused by: ? Weight gain. ? Pregnancy hormones that are relaxing the joints in your pelvis. ? A shift in weight and the muscles that support your balance.  Your breasts will continue to grow and they will continue to become tender.  Your gums  may bleed and may be sensitive to brushing and flossing.  Dark spots or blotches (chloasma, mask of pregnancy) may develop on your face. This will likely fade after the baby is born.  A dark line from your belly button to the pubic area (linea nigra) may appear. This will likely fade after the baby is born.  You may have changes in your hair. These can include thickening of your hair, rapid growth, and changes in texture. Some women also have hair loss during or after pregnancy, or hair that feels dry or thin. Your hair will most likely return to normal after your baby is born.  What to expect at prenatal visits During a routine prenatal visit:  You will be weighed to make sure you and the fetus are growing normally.  Your blood pressure will be taken.  Your abdomen will be measured to track your baby's growth.  The fetal heartbeat will be listened to.  Any test results from the previous visit will be discussed.  Your health care provider may ask you:  How you are feeling.  If you are feeling the baby move.  If you have had any abnormal symptoms, such as leaking fluid, bleeding, severe headaches, or abdominal cramping.  If you are using any tobacco products, including cigarettes, chewing tobacco, and electronic cigarettes.  If you have any questions.  Other tests that may be performed during your second trimester include:  Blood tests that check for: ? Low iron levels (anemia). ?  High blood sugar that affects pregnant women (gestational diabetes) between 20 and 28 weeks. ? Rh antibodies. This is to check for a protein on red blood cells (Rh factor).  Urine tests to check for infections, diabetes, or protein in the urine.  An ultrasound to confirm the proper growth and development of the baby.  An amniocentesis to check for possible genetic problems.  Fetal screens for spina bifida and Down syndrome.  HIV (human immunodeficiency virus) testing. Routine prenatal testing  includes screening for HIV, unless you choose not to have this test.  Follow these instructions at home: Medicines  Follow your health care provider's instructions regarding medicine use. Specific medicines may be either safe or unsafe to take during pregnancy.  Take a prenatal vitamin that contains at least 600 micrograms (mcg) of folic acid.  If you develop constipation, try taking a stool softener if your health care provider approves. Eating and drinking  Eat a balanced diet that includes fresh fruits and vegetables, whole grains, good sources of protein such as meat, eggs, or tofu, and low-fat dairy. Your health care provider will help you determine the amount of weight gain that is right for you.  Avoid raw meat and uncooked cheese. These carry germs that can cause birth defects in the baby.  If you have low calcium intake from food, talk to your health care provider about whether you should take a daily calcium supplement.  Limit foods that are high in fat and processed sugars, such as fried and sweet foods.  To prevent constipation: ? Drink enough fluid to keep your urine clear or pale yellow. ? Eat foods that are high in fiber, such as fresh fruits and vegetables, whole grains, and beans. Activity  Exercise only as directed by your health care provider. Most women can continue their usual exercise routine during pregnancy. Try to exercise for 30 minutes at least 5 days a week. Stop exercising if you experience uterine contractions.  Avoid heavy lifting, wear low heel shoes, and practice good posture.  A sexual relationship may be continued unless your health care provider directs you otherwise. Relieving pain and discomfort  Wear a good support bra to prevent discomfort from breast tenderness.  Take warm sitz baths to soothe any pain or discomfort caused by hemorrhoids. Use hemorrhoid cream if your health care provider approves.  Rest with your legs elevated if you have  leg cramps or low back pain.  If you develop varicose veins, wear support hose. Elevate your feet for 15 minutes, 3-4 times a day. Limit salt in your diet. Prenatal Care  Write down your questions. Take them to your prenatal visits.  Keep all your prenatal visits as told by your health care provider. This is important. Safety  Wear your seat belt at all times when driving.  Make a list of emergency phone numbers, including numbers for family, friends, the hospital, and police and fire departments. General instructions  Ask your health care provider for a referral to a local prenatal education class. Begin classes no later than the beginning of month 6 of your pregnancy.  Ask for help if you have counseling or nutritional needs during pregnancy. Your health care provider can offer advice or refer you to specialists for help with various needs.  Do not use hot tubs, steam rooms, or saunas.  Do not douche or use tampons or scented sanitary pads.  Do not cross your legs for long periods of time.  Avoid cat litter boxes and  soil used by cats. These carry germs that can cause birth defects in the baby and possibly loss of the fetus by miscarriage or stillbirth.  Avoid all smoking, herbs, alcohol, and unprescribed drugs. Chemicals in these products can affect the formation and growth of the baby.  Do not use any products that contain nicotine or tobacco, such as cigarettes and e-cigarettes. If you need help quitting, ask your health care provider.  Visit your dentist if you have not gone yet during your pregnancy. Use a soft toothbrush to brush your teeth and be gentle when you floss. Contact a health care provider if:  You have dizziness.  You have mild pelvic cramps, pelvic pressure, or nagging pain in the abdominal area.  You have persistent nausea, vomiting, or diarrhea.  You have a bad smelling vaginal discharge.  You have pain when you urinate. Get help right away if:  You  have a fever.  You are leaking fluid from your vagina.  You have spotting or bleeding from your vagina.  You have severe abdominal cramping or pain.  You have rapid weight gain or weight loss.  You have shortness of breath with chest pain.  You notice sudden or extreme swelling of your face, hands, ankles, feet, or legs.  You have not felt your baby move in over an hour.  You have severe headaches that do not go away when you take medicine.  You have vision changes. Summary  The second trimester is from week 14 through week 27 (months 4 through 6). It is also a time when the fetus is growing rapidly.  Your body goes through many changes during pregnancy. The changes vary from woman to woman.  Avoid all smoking, herbs, alcohol, and unprescribed drugs. These chemicals affect the formation and growth your baby.  Do not use any tobacco products, such as cigarettes, chewing tobacco, and e-cigarettes. If you need help quitting, ask your health care provider.  Contact your health care provider if you have any questions. Keep all prenatal visits as told by your health care provider. This is important. This information is not intended to replace advice given to you by your health care provider. Make sure you discuss any questions you have with your health car Trichomoniasis Trichomoniasis is an STI (sexually transmitted infection) that can affect both women and men. In women, the outer area of the female genitalia (vulva) and the vagina are affected. In men, the penis is mainly affected, but the prostate and other reproductive organs can also be involved. This condition can be treated with medicine. It often has no symptoms (is asymptomatic), especially in men. What are the causes? This condition is caused by an organism called Trichomonas vaginalis. Trichomoniasis most often spreads from person to person (is contagious) through sexual contact. What increases the risk? The following  factors may make you more likely to develop this condition:  Having unprotected sexual intercourse.  Having sexual intercourse with a partner who has trichomoniasis.  Having multiple sexual partners.  Having had previous trichomoniasis infections or other STIs.  What are the signs or symptoms? In women, symptoms of trichomoniasis include:  Abnormal vaginal discharge that is clear, white, gray, or yellow-green and foamy and has an unusual "fishy" odor.  Itching and irritation of the vagina and vulva.  Burning or pain during urination or sexual intercourse.  Genital redness and swelling.  In men, symptoms of trichomoniasis include:  Penile discharge that may be foamy or contain pus.  Pain in the  penis. This may happen only when urinating.  Itching or irritation inside the penis.  Burning after urination or ejaculation.  How is this diagnosed? In women, this condition may be found during a routine Pap test or physical exam. It may be found in men during a routine physical exam. Your health care provider may perform tests to help diagnose this infection, such as:  Urine tests (men and women).  The following in women: ? Testing the pH of the vagina. ? A vaginal swab test that checks for the Trichomonas vaginalis organism. ? Testing vaginal secretions.  Your health care provider may test you for other STIs, including HIV (human immunodeficiency virus). How is this treated? This condition is treated with medicine taken by mouth (orally), such as metronidazole or tinidazole to fight the infection. Your sexual partner(s) may also need to be tested and treated.  If you are a woman and you plan to become pregnant or think you may be pregnant, tell your health care provider right away. Some medicines that are used to treat the infection should not be taken during pregnancy.  Your health care provider may recommend over-the-counter medicines or creams to help relieve itching or  irritation. You may be tested for infection again 3 months after treatment. Follow these instructions at home:  Take and use over-the-counter and prescription medicines, including creams, only as told by your health care provider.  Do not have sexual intercourse until one week after you finish your medicine, or until your health care provider approves. Ask your health care provider when you may resume sexual intercourse.  (Women) Do not douche or wear tampons while you have the infection.  Discuss your infection with your sexual partner(s). Make sure that your partner gets tested and treated, if necessary.  Keep all follow-up visits as told by your health care provider. This is important. How is this prevented?  Use condoms every time you have sex. Using condoms correctly and consistently can help protect against STIs.  Avoid having multiple sexual partners.  Talk with your sexual partner about any symptoms that either of you may have, as well as any history of STIs.  Get tested for STIs and STDs (sexually transmitted diseases) before you have sex. Ask your partner to do the same.  Do not have sexual contact if you have symptoms of trichomoniasis or another STI. Contact a health care provider if:  You still have symptoms after you finish your medicine.  You develop pain in your abdomen.  You have pain when you urinate.  You have bleeding after sexual intercourse.  You develop a rash.  You feel nauseous or you vomit.  You plan to become pregnant or think you may be pregnant. Summary  Trichomoniasis is an STI (sexually transmitted infection) that can affect both women and men.  This condition often has no symptoms (is asymptomatic), especially in men.  You should not have sexual intercourse until one week after you finish your medicine, or until your health care provider approves. Ask your health care provider when you may resume sexual intercourse.  Discuss your infection  with your sexual partner. Make sure that your partner gets tested and treated, if necessary. This information is not intended to replace advice given to you by your health care provider. Make sure you discuss any questions you have with your health care provider. Document Released: 07/05/2000 Document Revised: 12/03/2015 Document Reviewed: 12/03/2015 Elsevier Interactive Patient Education  2017 [employer].  CHILDBIRTH CLASSES [phone] is the phone number for Pregnancy Classes or hospital tours at [facility].   You will be referred to  [email or web address] for more information on childbirth classes  At this site you may register for classes. You may sign up for a waiting list if classes are full. Please SIGN UP FOR THIS!.   When the waiting list becomes long, sometimes new classes can be added.

## 2017-05-11 LAB — INTEGRATED 2
AFP MOM: 1.11
Alpha-Fetoprotein: 64 ng/mL
CROWN RUMP LENGTH: 66.7 mm
DIA MoM: 2.26
DIA Value: 450.6 pg/mL
ESTRIOL UNCONJUGATED: 1.67 ng/mL
GEST. AGE ON COLLECTION DATE: 12.7 wk
GESTATIONAL AGE: 20.6 wk
HCG MOM: 1.27
Maternal Age at EDD: 31 yr
Nuchal Translucency (NT): 1.4 mm
Nuchal Translucency MoM: 0.84
Number of Fetuses: 1
PAPP-A MOM: 1.05
PAPP-A Value: 996.5 ng/mL
TEST RESULTS: NEGATIVE
Weight: 167 [lb_av]
Weight: 167 [lb_av]
hCG Value: 24.3 IU/mL
uE3 MoM: 0.8

## 2017-06-06 ENCOUNTER — Encounter: Payer: Medicaid Other | Admitting: Advanced Practice Midwife

## 2017-06-08 ENCOUNTER — Ambulatory Visit (INDEPENDENT_AMBULATORY_CARE_PROVIDER_SITE_OTHER): Payer: Medicaid Other | Admitting: Obstetrics and Gynecology

## 2017-06-08 ENCOUNTER — Other Ambulatory Visit: Payer: Self-pay

## 2017-06-08 ENCOUNTER — Encounter: Payer: Medicaid Other | Admitting: Women's Health

## 2017-06-08 ENCOUNTER — Encounter: Payer: Self-pay | Admitting: Obstetrics and Gynecology

## 2017-06-08 VITALS — BP 124/72 | HR 104 | Wt 179.0 lb

## 2017-06-08 DIAGNOSIS — O23592 Infection of other part of genital tract in pregnancy, second trimester: Secondary | ICD-10-CM

## 2017-06-08 DIAGNOSIS — Z1389 Encounter for screening for other disorder: Secondary | ICD-10-CM

## 2017-06-08 DIAGNOSIS — Z3482 Encounter for supervision of other normal pregnancy, second trimester: Secondary | ICD-10-CM

## 2017-06-08 DIAGNOSIS — A5901 Trichomonal vulvovaginitis: Secondary | ICD-10-CM

## 2017-06-08 DIAGNOSIS — Z3A25 25 weeks gestation of pregnancy: Secondary | ICD-10-CM

## 2017-06-08 DIAGNOSIS — Z331 Pregnant state, incidental: Secondary | ICD-10-CM

## 2017-06-08 LAB — POCT URINALYSIS DIPSTICK
GLUCOSE UA: NEGATIVE
KETONES UA: NEGATIVE
LEUKOCYTES UA: NEGATIVE
NITRITE UA: NEGATIVE
Protein, UA: NEGATIVE
RBC UA: NEGATIVE

## 2017-06-08 NOTE — Progress Notes (Signed)
[id number]  Estimated Date of Delivery: 09/20/17 [facility] [place] [redacted]w[redacted]d  Chief Complaint  Patient presents with  . Routine Prenatal Visit  ____  Patient complaints: Has not picked up her medicines allegedly due to cost ($3) she is unsure if her partners been treated or is going to be treated she just states that she is not sexually active at present we discussed this at length. Patient reports   good fetal movement,                           denies any bleeding , rupture of membranes,or regular contractions.  Blood pressure 124/72, pulse (!) 104, weight 179 lb (81.2 kg), last menstrual period 12/14/2016, unknown if currently breastfeeding.   Urine results:notable for neg refer to the ob flow sheet for FH and FHR, ,                          Physical Examination: General appearance - alert, well appearing, and in no distress and oriented to person, place, and time                                      Abdomen - FH 28 ,                                                         -FHR 136                                                         soft, nontender, nondistended, no masses or organomegaly                                      Pelvic -                                             Questions were answered. Assessment: LROB [id number] @ [redacted]w[redacted]d, untreated trichomoniasis, needs proof of cure for GC and chlamydia  Plan:  Continued routine obstetrical care, offered to speak with partner at follow-up visit  F/u in 2 weeks for glucose tolerance test, 4 weeks for low risk OB visit and proof of cure if she is gotten herself treated

## 2017-06-19 ENCOUNTER — Telehealth: Payer: Self-pay | Admitting: *Deleted

## 2017-06-22 ENCOUNTER — Other Ambulatory Visit: Payer: Self-pay | Admitting: Adult Health

## 2017-06-22 ENCOUNTER — Other Ambulatory Visit: Payer: Medicaid Other

## 2017-06-22 DIAGNOSIS — Z131 Encounter for screening for diabetes mellitus: Secondary | ICD-10-CM

## 2017-06-22 DIAGNOSIS — Z3482 Encounter for supervision of other normal pregnancy, second trimester: Secondary | ICD-10-CM

## 2017-06-22 DIAGNOSIS — Z3A27 27 weeks gestation of pregnancy: Secondary | ICD-10-CM

## 2017-06-23 LAB — ANTIBODY SCREEN: Antibody Screen: NEGATIVE

## 2017-06-23 LAB — RPR: RPR: NONREACTIVE

## 2017-06-23 LAB — CBC
Hematocrit: 29.1 % — ABNORMAL LOW (ref 34.0–46.6)
Hemoglobin: 9.4 g/dL — ABNORMAL LOW (ref 11.1–15.9)
MCH: 30.1 pg (ref 26.6–33.0)
MCHC: 32.3 g/dL (ref 31.5–35.7)
MCV: 93 fL (ref 79–97)
Platelets: 311 10*3/uL (ref 150–450)
RBC: 3.12 x10E6/uL — ABNORMAL LOW (ref 3.77–5.28)
RDW: 14.1 % (ref 12.3–15.4)
WBC: 11.9 10*3/uL — AB (ref 3.4–10.8)

## 2017-06-23 LAB — GLUCOSE TOLERANCE, 2 HOURS W/ 1HR
GLUCOSE, 2 HOUR: 94 mg/dL (ref 65–152)
Glucose, 1 hour: 109 mg/dL (ref 65–179)
Glucose, Fasting: 79 mg/dL (ref 65–91)

## 2017-06-23 LAB — HIV ANTIBODY (ROUTINE TESTING W REFLEX): HIV Screen 4th Generation wRfx: NONREACTIVE

## 2017-07-05 ENCOUNTER — Ambulatory Visit (INDEPENDENT_AMBULATORY_CARE_PROVIDER_SITE_OTHER): Payer: Medicaid Other | Admitting: Women's Health

## 2017-07-05 ENCOUNTER — Encounter: Payer: Self-pay | Admitting: Women's Health

## 2017-07-05 VITALS — BP 121/72 | HR 96 | Wt 184.4 lb

## 2017-07-05 DIAGNOSIS — A5901 Trichomonal vulvovaginitis: Secondary | ICD-10-CM

## 2017-07-05 DIAGNOSIS — Z3A29 29 weeks gestation of pregnancy: Secondary | ICD-10-CM

## 2017-07-05 DIAGNOSIS — D649 Anemia, unspecified: Secondary | ICD-10-CM

## 2017-07-05 DIAGNOSIS — Z1389 Encounter for screening for other disorder: Secondary | ICD-10-CM

## 2017-07-05 DIAGNOSIS — O23593 Infection of other part of genital tract in pregnancy, third trimester: Secondary | ICD-10-CM

## 2017-07-05 DIAGNOSIS — Z23 Encounter for immunization: Secondary | ICD-10-CM | POA: Diagnosis not present

## 2017-07-05 DIAGNOSIS — Z331 Pregnant state, incidental: Secondary | ICD-10-CM

## 2017-07-05 DIAGNOSIS — Z3483 Encounter for supervision of other normal pregnancy, third trimester: Secondary | ICD-10-CM

## 2017-07-05 DIAGNOSIS — O99013 Anemia complicating pregnancy, third trimester: Secondary | ICD-10-CM

## 2017-07-05 LAB — POCT URINALYSIS DIPSTICK
Blood, UA: NEGATIVE
Glucose, UA: NEGATIVE
KETONES UA: NEGATIVE
Leukocytes, UA: NEGATIVE
NITRITE UA: NEGATIVE
PROTEIN UA: POSITIVE — AB

## 2017-07-05 MED ORDER — FERROUS SULFATE 325 (65 FE) MG PO TABS: 325.0000 mg | ORAL_TABLET | Freq: Two times a day (BID) | ORAL | 3 refills | Status: DC

## 2017-07-05 NOTE — Patient Instructions (Addendum)
[name], I greatly value your feedback.  If you receive a survey following your visit with [place] today, we appreciate you taking the time to fill it out.  Thanks, [name], CNM, WHNP-BC   Call the office [phone]) or go to [facility] if:  You begin to have strong, frequent contractions  Your water breaks.  Sometimes it is a big gush of fluid, sometimes it is just a trickle that keeps getting your panties wet or running down your legs  You have vaginal bleeding.  It is normal to have a small amount of spotting if your cervix was checked.   You don't feel your baby moving like normal.  If you don't, get you something to eat and drink and lay down and focus on feeling your baby move.  You should feel at least 10 movements in 2 hours.  If you don't, you should call the office or go to [facility].    Tdap Vaccine  It is recommended that you get the Tdap vaccine during the third trimester of EACH pregnancy to help protect your baby from getting pertussis (whooping cough)  27-36 weeks is the BEST time to do this so that you can pass the protection on to your baby. During pregnancy is better than after pregnancy, but if you are unable to get it during pregnancy it will be offered at the hospital.   You can get this vaccine at the health department or your family doctor  Everyone who will be around your baby should also be up-to-date on their vaccines. Adults (who are not pregnant) only need 1 dose of Tdap during adulthood.   Third Trimester of Pregnancy The third trimester is from week 29 through week 42, months 7 through 9. The third trimester is a time when the fetus is growing rapidly. At the end of the ninth month, the fetus is about 20 inches in length and weighs 6-10 pounds.  BODY CHANGES Your body goes through many changes during pregnancy. The changes vary from woman to woman.   Your weight will continue to increase. You can expect to gain 25-35 pounds (11-16 kg)  by the end of the pregnancy.  You may begin to get stretch marks on your hips, abdomen, and breasts.  You may urinate more often because the fetus is moving lower into your pelvis and pressing on your bladder.  You may develop or continue to have heartburn as a result of your pregnancy.  You may develop constipation because certain hormones are causing the muscles that push waste through your intestines to slow down.  You may develop hemorrhoids or swollen, bulging veins (varicose veins).  You may have pelvic pain because of the weight gain and pregnancy hormones relaxing your joints between the bones in your pelvis. Backaches may result from overexertion of the muscles supporting your posture.  You may have changes in your hair. These can include thickening of your hair, rapid growth, and changes in texture. Some women also have hair loss during or after pregnancy, or hair that feels dry or thin. Your hair will most likely return to normal after your baby is born.  Your breasts will continue to grow and be tender. A yellow discharge may leak from your breasts called colostrum.  Your belly button may stick out.  You may feel short of breath because of your expanding uterus.  You may notice the fetus "dropping," or moving lower in your abdomen.  You may have a bloody  mucus discharge. This usually occurs a few days to a week before labor begins.  Your cervix becomes thin and soft (effaced) near your due date. WHAT TO EXPECT AT YOUR PRENATAL EXAMS  You will have prenatal exams every 2 weeks until week 36. Then, you will have weekly prenatal exams. During a routine prenatal visit:  You will be weighed to make sure you and the fetus are growing normally.  Your blood pressure is taken.  Your abdomen will be measured to track your baby's growth.  The fetal heartbeat will be listened to.  Any test results from the previous visit will be discussed.  You may have a cervical check near  your due date to see if you have effaced. At around 36 weeks, your caregiver will check your cervix. At the same time, your caregiver will also perform a test on the secretions of the vaginal tissue. This test is to determine if a type of bacteria, Group B streptococcus, is present. Your caregiver will explain this further. Your caregiver may ask you:  What your birth plan is.  How you are feeling.  If you are feeling the baby move.  If you have had any abnormal symptoms, such as leaking fluid, bleeding, severe headaches, or abdominal cramping.  If you have any questions. Other tests or screenings that may be performed during your third trimester include:  Blood tests that check for low iron levels (anemia).  Fetal testing to check the health, activity level, and growth of the fetus. Testing is done if you have certain medical conditions or if there are problems during the pregnancy. FALSE LABOR You may feel small, irregular contractions that eventually go away. These are called Braxton Hicks contractions, or false labor. Contractions may last for hours, days, or even weeks before true labor sets in. If contractions come at regular intervals, intensify, or become painful, it is best to be seen by your caregiver.  SIGNS OF LABOR   Menstrual-like cramps.  Contractions that are 5 minutes apart or less.  Contractions that start on the top of the uterus and spread down to the lower abdomen and back.  A sense of increased pelvic pressure or back pain.  A watery or bloody mucus discharge that comes from the vagina. If you have any of these signs before the 37th week of pregnancy, call your caregiver right away. You need to go to the hospital to get checked immediately. HOME CARE INSTRUCTIONS   Avoid all smoking, herbs, alcohol, and unprescribed drugs. These chemicals affect the formation and growth of the baby.  Follow your caregiver's instructions regarding medicine use. There are  medicines that are either safe or unsafe to take during pregnancy.  Exercise only as directed by your caregiver. Experiencing uterine cramps is a good sign to stop exercising.  Continue to eat regular, healthy meals.  Wear a good support bra for breast tenderness.  Do not use hot tubs, steam rooms, or saunas.  Wear your seat belt at all times when driving.  Avoid raw meat, uncooked cheese, cat litter boxes, and soil used by cats. These carry germs that can cause birth defects in the baby.  Take your prenatal vitamins.  Try taking a stool softener (if your caregiver approves) if you develop constipation. Eat more high-fiber foods, such as fresh vegetables or fruit and whole grains. Drink plenty of fluids to keep your urine clear or pale yellow.  Take warm sitz baths to soothe any pain or discomfort caused by hemorrhoids.  Use hemorrhoid cream if your caregiver approves.  If you develop varicose veins, wear support hose. Elevate your feet for 15 minutes, 3-4 times a day. Limit salt in your diet.  Avoid heavy lifting, wear low heal shoes, and practice good posture.  Rest a lot with your legs elevated if you have leg cramps or low back pain.  Visit your dentist if you have not gone during your pregnancy. Use a soft toothbrush to brush your teeth and be gentle when you floss.  A sexual relationship may be continued unless your caregiver directs you otherwise.  Do not travel far distances unless it is absolutely necessary and only with the approval of your caregiver.  Take prenatal classes to understand, practice, and ask questions about the labor and delivery.  Make a trial run to the hospital.  Pack your hospital bag.  Prepare the baby's nursery.  Continue to go to all your prenatal visits as directed by your caregiver. SEEK MEDICAL CARE IF:  You are unsure if you are in labor or if your water has broken.  You have dizziness.  You have mild pelvic cramps, pelvic pressure, or  nagging pain in your abdominal area.  You have persistent nausea, vomiting, or diarrhea.  You have a bad smelling vaginal discharge.  You have pain with urination. SEEK IMMEDIATE MEDICAL CARE IF:   You have a fever.  You are leaking fluid from your vagina.  You have spotting or bleeding from your vagina.  You have severe abdominal cramping or pain.  You have rapid weight loss or gain.  You have shortness of breath with chest pain.  You notice sudden or extreme swelling of your face, hands, ankles, feet, or legs.  You have not felt your baby move in over an hour.  You have severe headaches that do not go away with medicine.  You have vision changes. Document Released: 01/03/2001 Document Revised: 01/14/2013 Document Reviewed: 03/12/2012 [facility] Patient Information 2015 [place], [place]. This information is not intended to replace advice given to you by your health care provider. Make sure you discuss any questions you have with your health care provider.   Iron-Rich Diet Iron is a mineral that helps your body to produce hemoglobin. Hemoglobin is a protein in your red blood cells that carries oxygen to your body's tissues. Eating too little iron may cause you to feel weak and tired, and it can increase your risk for infection. Eating enough iron is necessary for your body's metabolism, muscle function, and nervous system. Iron is naturally found in many foods. It can also be added to foods or fortified in foods. There are two types of dietary iron:  Heme iron. Heme iron is absorbed by the body more easily than nonheme iron. Heme iron is found in meat, poultry, and fish.  Nonheme iron. Nonheme iron is found in dietary supplements, iron-fortified grains, beans, and vegetables.  You may need to follow an iron-rich diet if:  You have been diagnosed with iron deficiency or iron-deficiency anemia.  You have a condition that prevents you from absorbing dietary iron, such  as: ? Infection in your intestines. ? Celiac disease. This involves long-lasting (chronic) inflammation of your intestines.  You do not eat enough iron.  You eat a diet that is high in foods that impair iron absorption.  You have lost a lot of blood.  You have heavy bleeding during your menstrual cycle.  You are pregnant.  What is my plan? Your health care provider may  help you to determine how much iron you need per day based on your condition. Generally, when a person consumes sufficient amounts of iron in the diet, the following iron needs are met:  Men. ? 67-62 years old: 11 mg per day. ? 63-34 years old: 8 mg per day.  Women. ? 20-77 years old: 15 mg per day. ? 37-27 years old: 18 mg per day. ? Over 57 years old: 8 mg per day. ? Pregnant women: 27 mg per day. ? Breastfeeding women: 9 mg per day.  What do I need to know about an iron-rich diet?  Eat fresh fruits and vegetables that are high in vitamin C along with foods that are high in iron. This will help increase the amount of iron that your body absorbs from food, especially with foods containing nonheme iron. Foods that are high in vitamin C include oranges, peppers, tomatoes, and mango.  Take iron supplements only as directed by your health care provider. Overdose of iron can be life-threatening. If you were prescribed iron supplements, take them with orange juice or a vitamin C supplement.  Cook foods in pots and pans that are made from iron.  Eat nonheme iron-containing foods alongside foods that are high in heme iron. This helps to improve your iron absorption.  Certain foods and drinks contain compounds that impair iron absorption. Avoid eating these foods in the same meal as iron-rich foods or with iron supplements. These include: ? Coffee, black tea, and red wine. ? Milk, dairy products, and foods that are high in calcium. ? Beans, soybeans, and peas. ? Whole grains.  When eating foods that contain both  nonheme iron and compounds that impair iron absorption, follow these tips to absorb iron better. ? Soak beans overnight before cooking. ? Soak whole grains overnight and drain them before using. ? Ferment flours before baking, such as using yeast in bread dough. What foods can I eat? Grains Iron-fortified breakfast cereal. Iron-fortified whole-wheat bread. Enriched rice. Sprouted grains. Vegetables Spinach. Potatoes with skin. Green peas. Broccoli. Red and green bell peppers. Fermented vegetables. Fruits Prunes. Raisins. Oranges. Strawberries. Mango. Grapefruit. Meats and Other Protein Sources Beef liver. Oysters. Beef. Shrimp. [place]. Chicken. [place]. Sardines. Chickpeas. Nuts. Tofu. Beverages Tomato juice. Fresh orange juice. Prune juice. Hibiscus tea. Fortified instant breakfast shakes. Condiments Tahini. Fermented soy sauce. Sweets and Desserts Black-strap molasses. Other Wheat germ. The items listed above may not be a complete list of recommended foods or beverages. Contact your dietitian for more options. What foods are not recommended? Grains Whole grains. Bran cereal. Bran flour. Oats. Vegetables Artichokes. Brussels sprouts. Kale. Fruits Blueberries. Raspberries. Strawberries. Figs. Meats and Other Protein Sources Soybeans. Products made from soy protein. Dairy Milk. Cream. Cheese. Yogurt. Cottage cheese. Beverages Coffee. Black tea. Red wine. Sweets and Desserts Cocoa. Chocolate. Ice cream. Other Basil. Oregano. Parsley. The items listed above may not be a complete list of foods and beverages to avoid. Contact your dietitian for more information. This information is not intended to replace advice given to you by your health care provider. Make sure you discuss any questions you have with your health care provider. Document Released: 08/23/2004 Document Revised: 07/30/2015 Document Reviewed: 08/06/2013 Elsevier Interactive Patient Education  [employer].

## 2017-07-05 NOTE — Progress Notes (Signed)
   LOW-RISK PREGNANCY VISIT Patient name: [name] MRN [id number]  Date of birth: [birth year] Chief Complaint:   Routine Prenatal Visit  History of Present Illness:   [name] is a 32 y.o. [id number] female at 1250w0d with an Estimated Date of Delivery: 09/20/17 being seen today for ongoing management of a low-risk pregnancy.  Today she reports no complaints. Still taking metronidazole for trichomonas.  Contractions: Not present. Vag. Bleeding: None.  Movement: Present. denies leaking of fluid. Review of Systems:   Pertinent items are noted in HPI Denies abnormal vaginal discharge w/ itching/odor/irritation, headaches, visual changes, shortness of breath, chest pain, abdominal pain, severe nausea/vomiting, or problems with urination or bowel movements unless otherwise stated above. Pertinent History Reviewed:  Reviewed past medical,surgical, social, obstetrical and family history.  Reviewed problem list, medications and allergies. Physical Assessment:   Vitals:   07/05/17 1628  BP: 121/72  Pulse: 96  Weight: 184 lb 6.4 oz (83.6 kg)  Body mass index is 33.73 kg/m.        Physical Examination:   General appearance: Well appearing, and in no distress  Mental status: Alert, oriented to person, place, and time  Skin: Warm & dry  Cardiovascular: Normal heart rate noted  Respiratory: Normal respiratory effort, no distress  Abdomen: Soft, gravid, nontender  Pelvic: Cervical exam deferred         Extremities: Edema: None  Fetal Status: Fetal Heart Rate (bpm): 137 Fundal Height: 29 cm Movement: Present    Results for orders placed or performed in visit on 07/05/17 (from the past 24 hour(s))  POCT urinalysis dipstick   Collection Time: 07/05/17  4:32 PM  Result Value Ref Range   Color, UA     Clarity, UA     Glucose, UA Negative Negative   Bilirubin, UA     Ketones, UA neg    Spec Grav, UA  1.010 - 1.025   Blood, UA neg    pH, UA  5.0 - 8.0   Protein, UA Positive (A)  Negative   Urobilinogen, UA  0.2 or 1.0 E.U./dL   Nitrite, UA neg    Leukocytes, UA Negative Negative   Appearance     Odor      Assessment & Plan:  1) Low-risk pregnancy [id number] at 7050w0d with an Estimated Date of Delivery: 09/20/17   2) Recent trichomonas, still finishing up meds, will do POC next visit  3) Anemia> Hgb 9.4 on PN2, rx fe bid, increase fe-rich foods   Meds:  Meds ordered this encounter  Medications  . ferrous sulfate 325 (65 FE) MG tablet    Sig: Take 1 tablet (325 mg total) by mouth 2 (two) times daily with a meal.    Dispense:  60 tablet    Refill:  3    Order Specific Question:   Supervising Provider    Answer:   Lazaro ArmsEURE, LUTHER H [[id number]]   Labs/procedures today: tdap  Plan:  Continue routine obstetrical care   Reviewed: Preterm labor symptoms and general obstetric precautions including but not limited to vaginal bleeding, contractions, leaking of fluid and fetal movement were reviewed in detail with the patient.  All questions were answered  Follow-up: Return in about 3 weeks (around 07/26/2017) for LROB .  Orders Placed This Encounter  Procedures  . Tdap vaccine greater than or equal to 7yo IM  . POCT urinalysis dipstick   Cheral MarkerKimberly R Venita Seng CNM, [facility]NP-BC 07/05/2017 4:48 PM

## 2017-07-27 ENCOUNTER — Encounter: Payer: Self-pay | Admitting: Obstetrics & Gynecology

## 2017-07-27 ENCOUNTER — Ambulatory Visit (INDEPENDENT_AMBULATORY_CARE_PROVIDER_SITE_OTHER): Payer: Medicaid Other | Admitting: Obstetrics & Gynecology

## 2017-07-27 ENCOUNTER — Other Ambulatory Visit: Payer: Self-pay

## 2017-07-27 VITALS — BP 126/70 | HR 104 | Wt 186.0 lb

## 2017-07-27 DIAGNOSIS — Z3483 Encounter for supervision of other normal pregnancy, third trimester: Secondary | ICD-10-CM

## 2017-07-27 DIAGNOSIS — Z331 Pregnant state, incidental: Secondary | ICD-10-CM

## 2017-07-27 DIAGNOSIS — A599 Trichomoniasis, unspecified: Secondary | ICD-10-CM

## 2017-07-27 DIAGNOSIS — Z1389 Encounter for screening for other disorder: Secondary | ICD-10-CM

## 2017-07-27 DIAGNOSIS — Z3A32 32 weeks gestation of pregnancy: Secondary | ICD-10-CM

## 2017-07-27 LAB — POCT URINALYSIS DIPSTICK
Glucose, UA: NEGATIVE
Ketones, UA: NEGATIVE
LEUKOCYTES UA: NEGATIVE
NITRITE UA: NEGATIVE
PROTEIN UA: POSITIVE — AB
RBC UA: NEGATIVE

## 2017-07-27 NOTE — Progress Notes (Signed)
   LOW-RISK PREGNANCY VISIT Patient name: [name] MRN [id number]  Date of birth: 10/25/1985 Chief Complaint:   Routine Prenatal Visit  History of Present Illness:   [name] is a 32 y.o. [id number] female at 8254w1d with an Estimated Date of Delivery: 09/20/17 being seen today for ongoing management of a low-risk pregnancy.  Today she reports no complaints. Contractions: Not present. Vag. Bleeding: None.  Movement: Present. denies leaking of fluid. Review of Systems:   Pertinent items are noted in HPI Denies abnormal vaginal discharge w/ itching/odor/irritation, headaches, visual changes, shortness of breath, chest pain, abdominal pain, severe nausea/vomiting, or problems with urination or bowel movements unless otherwise stated above. Pertinent History Reviewed:  Reviewed past medical,surgical, social, obstetrical and family history.  Reviewed problem list, medications and allergies. Physical Assessment:   Vitals:   07/27/17 1213  BP: 126/70  Pulse: (!) 104  Weight: 186 lb (84.4 kg)  Body mass index is 34.02 kg/m.        Physical Examination:   General appearance: Well appearing, and in no distress  Mental status: Alert, oriented to person, place, and time  Skin: Warm & dry  Cardiovascular: Normal heart rate noted  Respiratory: Normal respiratory effort, no distress  Abdomen: Soft, gravid, nontender  Pelvic: Cervical exam deferred         Extremities: Edema: Trace  Fetal Status:     Movement: Present    Results for orders placed or performed in visit on 07/27/17 (from the past 24 hour(s))  POCT urinalysis dipstick   Collection Time: 07/27/17 12:14 PM  Result Value Ref Range   Color, UA     Clarity, UA     Glucose, UA Negative Negative   Bilirubin, UA     Ketones, UA neg    Spec Grav, UA  1.010 - 1.025   Blood, UA neg    pH, UA  5.0 - 8.0   Protein, UA Positive (A) Negative   Urobilinogen, UA  0.2 or 1.0 E.U./dL   Nitrite, UA neg    Leukocytes, UA  Negative Negative   Appearance     Odor      Assessment & Plan:  1) Low-risk pregnancy [id number] at 6754w1d with an Estimated Date of Delivery: 09/20/17   2) +Trichomonas, TOC is negative today   Meds: No orders of the defined types were placed in this encounter.  Labs/procedures today:   Plan:  Continue routine obstetrical care   Reviewed: Preterm labor symptoms and general obstetric precautions including but not limited to vaginal bleeding, contractions, leaking of fluid and fetal movement were reviewed in detail with the patient.  All questions were answered  Follow-up: Return in about 2 weeks (around 08/10/2017) for LROB.  Orders Placed This Encounter  Procedures  . POCT urinalysis dipstick   Lazaro ArmsLuther H Lelia Jons  07/27/2017 12:49 PM

## 2017-08-09 ENCOUNTER — Encounter: Payer: Medicaid Other | Admitting: Women's Health

## 2017-08-15 ENCOUNTER — Encounter: Payer: Self-pay | Admitting: Women's Health

## 2017-08-15 ENCOUNTER — Ambulatory Visit (INDEPENDENT_AMBULATORY_CARE_PROVIDER_SITE_OTHER): Payer: Medicaid Other | Admitting: Women's Health

## 2017-08-15 VITALS — BP 116/66 | HR 105 | Wt 194.5 lb

## 2017-08-15 DIAGNOSIS — Z1389 Encounter for screening for other disorder: Secondary | ICD-10-CM

## 2017-08-15 DIAGNOSIS — Z3483 Encounter for supervision of other normal pregnancy, third trimester: Secondary | ICD-10-CM

## 2017-08-15 DIAGNOSIS — Z331 Pregnant state, incidental: Secondary | ICD-10-CM

## 2017-08-15 LAB — POCT URINALYSIS DIPSTICK OB
GLUCOSE, UA: NEGATIVE — AB
KETONES UA: NEGATIVE
Leukocytes, UA: NEGATIVE
Nitrite, UA: NEGATIVE
POC,PROTEIN,UA: NEGATIVE

## 2017-08-15 MED ORDER — FERROUS SULFATE 325 (65 FE) MG PO TABS: 325.0000 mg | ORAL_TABLET | Freq: Two times a day (BID) | ORAL | 3 refills | Status: DC

## 2017-08-15 NOTE — Patient Instructions (Signed)
Lynda RainwaterQuinta L Ledgerwood, I greatly value your feedback.  If you receive a survey following your visit with us today, we appreciate you taking the time to fill it out.  Thanks, Joellyn HaffKim Delana Manganello, CNM, WHNP-BC   Call the office [phone]) or go to [facility] if:  You begin to have strong, frequent contractions  Your water breaks.  Sometimes it is a big gush of fluid, sometimes it is just a trickle that keeps getting your panties wet or running down your legs  You have vaginal bleeding.  It is normal to have a small amount of spotting if your cervix was checked.   You don't feel your baby moving like normal.  If you don't, get you something to eat and drink and lay down and focus on feeling your baby move.  You should feel at least 10 movements in 2 hours.  If you don't, you should call the office or go to [facility].     Preterm Labor and Birth Information The normal length of a pregnancy is 39-41 weeks. Preterm labor is when labor starts before 37 completed weeks of pregnancy. What are the risk factors for preterm labor? Preterm labor is more likely to occur in women who:  Have certain infections during pregnancy such as a bladder infection, sexually transmitted infection, or infection inside the uterus (chorioamnionitis).  Have a shorter-than-normal cervix.  Have gone into preterm labor before.  Have had surgery on their cervix.  Are younger than age 31 or older than age 31.  Are African American.  Are pregnant with twins or multiple babies (multiple gestation).  Take street drugs or smoke while pregnant.  Do not gain enough weight while pregnant.  Became pregnant shortly after having been pregnant.  What are the symptoms of preterm labor? Symptoms of preterm labor include:  Cramps similar to those that can happen during a menstrual period. The cramps may happen with diarrhea.  Pain in the abdomen or lower back.  Regular uterine contractions that may feel like  tightening of the abdomen.  A feeling of increased pressure in the pelvis.  Increased watery or bloody mucus discharge from the vagina.  Water breaking (ruptured amniotic sac).  Why is it important to recognize signs of preterm labor? It is important to recognize signs of preterm labor because babies who are born prematurely may not be fully developed. This can put them at an increased risk for:  Long-term (chronic) heart and lung problems.  Difficulty immediately after birth with regulating body systems, including blood sugar, body temperature, heart rate, and breathing rate.  Bleeding in the brain.  Cerebral palsy.  Learning difficulties.  Death.  These risks are highest for babies who are born before 34 weeks of pregnancy. How is preterm labor treated? Treatment depends on the length of your pregnancy, your condition, and the health of your baby. It may involve:  Having a stitch (suture) placed in your cervix to prevent your cervix from opening too early (cerclage).  Taking or being given medicines, such as: ? Hormone medicines. These may be given early in pregnancy to help support the pregnancy. ? Medicine to stop contractions. ? Medicines to help mature the baby's lungs. These may be prescribed if the risk of delivery is high. ? Medicines to prevent your baby from developing cerebral palsy.  If the labor happens before 34 weeks of pregnancy, you may need to stay in the hospital. What should I do if I think I am in preterm labor? If  you think that you are going into preterm labor, call your health care provider right away. How can I prevent preterm labor in future pregnancies? To increase your chance of having a full-term pregnancy:  Do not use any tobacco products, such as cigarettes, chewing tobacco, and e-cigarettes. If you need help quitting, ask your health care provider.  Do not use street drugs or medicines that have not been prescribed to you during your  pregnancy.  Talk with your health care provider before taking any herbal supplements, even if you have been taking them regularly.  Make sure you gain a healthy amount of weight during your pregnancy.  Watch for infection. If you think that you might have an infection, get it checked right away.  Make sure to tell your health care provider if you have gone into preterm labor before.  This information is not intended to replace advice given to you by your health care provider. Make sure you discuss any questions you have with your health care provider. Document Released: 04/01/2003 Document Revised: 06/22/2015 Document Reviewed: 06/02/2015 Elsevier Interactive Patient Education  2018 [employer].

## 2017-08-15 NOTE — Progress Notes (Signed)
LOW-RISK PREGNANCY VISIT Patient name: [name] MRN [id number]  Date of birth: April 14, 1985 Chief Complaint:   Routine Prenatal Visit (upper back pain)  History of Present Illness:   [name] is a 32 y.o. [phone] female at 3431w6d with an Estimated Date of Delivery: 09/20/17 being seen today for ongoing management of a low-risk pregnancy.  Today she reports hasn't started Fe b/c pharmacy said rx wasn't there. Wants another u/s, b/c she wants to see baby/make sure everything's ok-discussed no u/s unless medical reason. Mid upper back pain. Contractions: Not present. Vag. Bleeding: None.  Movement: Present. denies leaking of fluid. Review of Systems:   Pertinent items are noted in HPI Denies abnormal vaginal discharge w/ itching/odor/irritation, headaches, visual changes, shortness of breath, chest pain, abdominal pain, severe nausea/vomiting, or problems with urination or bowel movements unless otherwise stated above. Pertinent History Reviewed:  Reviewed past medical,surgical, social, obstetrical and family history.  Reviewed problem list, medications and allergies. Physical Assessment:   Vitals:   08/15/17 1519  BP: 116/66  Pulse: (!) 105  Weight: 194 lb 8 oz (88.2 kg)  Body mass index is 35.57 kg/m.        Physical Examination:   General appearance: Well appearing, and in no distress  Mental status: Alert, oriented to person, place, and time  Skin: Warm & dry  Back: tenderness to palpation across mid back- musculoskeletal  Cardiovascular: Normal heart rate noted  Respiratory: Normal respiratory effort, no distress  Abdomen: Soft, gravid, nontender  Pelvic: Cervical exam deferred         Extremities: Edema: Trace  Fetal Status: Fetal Heart Rate (bpm): 140 Fundal Height: 34 cm Movement: Present    Results for orders placed or performed in visit on 08/15/17 (from the past 24 hour(s))  POC Urinalysis Dipstick OB   Collection Time: 08/15/17  3:20 PM  Result Value  Ref Range   Color, UA     Clarity, UA     Glucose, UA Negative (A) (none)   Bilirubin, UA     Ketones, UA neg    Spec Grav, UA  1.010 - 1.025   Blood, UA trace    pH, UA  5.0 - 8.0   POC Protein UA Negative Negative, Trace   Urobilinogen, UA  0.2 or 1.0 E.U./dL   Nitrite, UA neg    Leukocytes, UA Negative Negative   Appearance     Odor      Assessment & Plan:  1) Low-risk pregnancy [id number] at 1831w6d with an Estimated Date of Delivery: 09/20/17   2) Musculoskeletal back pain, apap/heating pad/massage  3) Anemia> re-sent rx for fe, take bid, increase fe-rich foods   Meds:  Meds ordered this encounter  Medications  . ferrous sulfate 325 (65 FE) MG tablet    Sig: Take 1 tablet (325 mg total) by mouth 2 (two) times daily with a meal.    Dispense:  60 tablet    Refill:  3    Order Specific Question:   Supervising Provider    Answer:   Lazaro ArmsEURE, LUTHER H [[id number]]   Labs/procedures today: none  Plan:  Continue routine obstetrical care   Reviewed: Preterm labor symptoms and general obstetric precautions including but not limited to vaginal bleeding, contractions, leaking of fluid and fetal movement were reviewed in detail with the patient.  All questions were answered  Follow-up: Return in about 2 weeks (around 08/29/2017) for LROB.  Orders Placed This Encounter  Procedures  . POC Urinalysis  Dipstick OB   Cheral Marker CNM, [facility] 08/15/2017 3:40 PM

## 2017-08-30 ENCOUNTER — Encounter: Payer: Medicaid Other | Admitting: Advanced Practice Midwife

## 2017-08-31 ENCOUNTER — Ambulatory Visit (INDEPENDENT_AMBULATORY_CARE_PROVIDER_SITE_OTHER): Payer: Medicaid Other | Admitting: Obstetrics and Gynecology

## 2017-08-31 ENCOUNTER — Encounter: Payer: Self-pay | Admitting: Obstetrics and Gynecology

## 2017-08-31 VITALS — BP 118/68 | HR 103 | Wt 194.6 lb

## 2017-08-31 DIAGNOSIS — Z3483 Encounter for supervision of other normal pregnancy, third trimester: Secondary | ICD-10-CM

## 2017-08-31 DIAGNOSIS — Z3A37 37 weeks gestation of pregnancy: Secondary | ICD-10-CM

## 2017-08-31 DIAGNOSIS — Z331 Pregnant state, incidental: Secondary | ICD-10-CM

## 2017-08-31 DIAGNOSIS — Z1389 Encounter for screening for other disorder: Secondary | ICD-10-CM

## 2017-08-31 LAB — POCT URINALYSIS DIPSTICK OB
Blood, UA: NEGATIVE
Glucose, UA: NEGATIVE — AB
KETONES UA: NEGATIVE
Leukocytes, UA: NEGATIVE
Nitrite, UA: NEGATIVE

## 2017-08-31 NOTE — Progress Notes (Signed)
Patient ID: [name], female   DOB: 05-Nov-1985, 32 y.o.   MRN: [id number]    LOW-RISK PREGNANCY VISIT Patient name: [name] MRN [id number]  Date of birth: 05-Nov-1985 Chief Complaint:   Routine Prenatal Visit (GBS; GC/CHL; stomach pain)  History of Present Illness:   [name] is a 32 y.o. [phone] female at 5877w1d with an Estimated Date of Delivery: 09/20/17 being seen today for ongoing management of a low-risk pregnancy.  Today she reports stomach pain. The baby kicks right under her ribs and causes her pain. She is unsure about birth control after this child but she does not want to get her tubes tied. She wants to try Depo because BCPs made her emotional.given info on IUD, and Nexplanon.   Contractions: Not present. Vag. Bleeding: None.  Movement: Present. denies leaking of fluid. Review of Systems:   Pertinent items are noted in HPI Denies abnormal vaginal discharge w/ itching/odor/irritation, headaches, visual changes, shortness of breath, chest pain, abdominal pain, severe nausea/vomiting, or problems with urination or bowel movements unless otherwise stated above. Pertinent History Reviewed:  Reviewed past medical,surgical, social, obstetrical and family history.  Reviewed problem list, medications and allergies. Physical Assessment:   Vitals:   08/31/17 1207  BP: 118/68  Pulse: (!) 103  Weight: 194 lb 9.6 oz (88.3 kg)  Body mass index is 35.59 kg/m.        Physical Examination:   General appearance: Well appearing, and in no distress  Mental status: Alert, oriented to person, place, and time  Skin: Warm & dry  Cardiovascular: Normal heart rate noted  Respiratory: Normal respiratory effort, no distress  Abdomen: Soft, gravid, nontender  Pelvic: Cervical exam performed dilation 0cm        Extremities: Edema: Trace  Fetal Status: Fetal Heart Rate (bpm): 137 Fundal Height: 34 cm Movement: Present  Vertex  Results for orders placed or performed in  visit on 08/31/17 (from the past 24 hour(s))  POC Urinalysis Dipstick OB   Collection Time: 08/31/17 12:05 PM  Result Value Ref Range   Color, UA     Clarity, UA     Glucose, UA Negative (A) (none)   Bilirubin, UA     Ketones, UA neg    Spec Grav, UA     Blood, UA neg    pH, UA     POC Protein UA Small (1+) (A) Negative, Trace   Urobilinogen, UA     Nitrite, UA neg    Leukocytes, UA Negative Negative   Appearance     Odor      Assessment & Plan:  1) Low-risk pregnancy [id number] at 3177w1d with an Estimated Date of Delivery: 09/20/17    Meds: No orders of the defined types were placed in this encounter.  Labs/procedures today: GBS, GC/CHL  Plan:  Continue routine obstetrical care  Reviewed: Term labor symptoms and general obstetric precautions including but not limited to vaginal bleeding, contractions, leaking of fluid and fetal movement were reviewed in detail with the patient.  All questions were answered  Follow-up: Return in about 1 week (around 09/07/2017) for LROB.  Orders Placed This Encounter  Procedures  . GC/Chlamydia Probe Amp  . Strep Gp B NAA  . POC Urinalysis Dipstick OB   By signing my name below, I, Pietro CassisEmily Tufford, attest that this documentation has been prepared under the direction and in the presence of Tilda BurrowFerguson, Mays Paino V, MD Electronically Signed: Pietro CassisEmily Tufford, Medical Scribe. 08/31/17. 12:27 PM.  I  personally performed the services described in this documentation, which was SCRIBED in my presence. The recorded information has been reviewed and considered accurate. It has been edited as necessary during review. Tilda Burrow, MD

## 2017-09-02 LAB — STREP GP B NAA: Strep Gp B NAA: POSITIVE — AB

## 2017-09-05 LAB — GC/CHLAMYDIA PROBE AMP
CHLAMYDIA, DNA PROBE: NEGATIVE
NEISSERIA GONORRHOEAE BY PCR: NEGATIVE

## 2017-09-07 ENCOUNTER — Encounter: Payer: Self-pay | Admitting: Obstetrics & Gynecology

## 2017-09-07 ENCOUNTER — Ambulatory Visit (INDEPENDENT_AMBULATORY_CARE_PROVIDER_SITE_OTHER): Payer: Medicaid Other | Admitting: Obstetrics & Gynecology

## 2017-09-07 VITALS — BP 134/74 | HR 111 | Wt 190.0 lb

## 2017-09-07 DIAGNOSIS — Z3A38 38 weeks gestation of pregnancy: Secondary | ICD-10-CM

## 2017-09-07 DIAGNOSIS — Z3483 Encounter for supervision of other normal pregnancy, third trimester: Secondary | ICD-10-CM

## 2017-09-07 NOTE — Progress Notes (Signed)
[id number] 6752w1d Estimated Date of Delivery: 09/20/17  Blood pressure 134/74, pulse (!) 111, weight 190 lb (86.2 kg), last menstrual period 12/14/2016, unknown if currently breastfeeding.   BP weight and urine results all reviewed and noted.  Please refer to the obstetrical flow sheet for the fundal height and fetal heart rate documentation:  Patient reports good fetal movement, denies any bleeding and no rupture of membranes symptoms or regular contractions. Patient is without complaints. All questions were answered.  No orders of the defined types were placed in this encounter.   Plan:  Continued routine obstetrical care,   Return in about 1 week (around 09/14/2017) for LROB.

## 2017-09-13 ENCOUNTER — Encounter: Payer: Self-pay | Admitting: Women's Health

## 2017-09-13 ENCOUNTER — Ambulatory Visit (INDEPENDENT_AMBULATORY_CARE_PROVIDER_SITE_OTHER): Payer: Medicaid Other | Admitting: Women's Health

## 2017-09-13 VITALS — BP 114/68 | HR 107 | Wt 193.6 lb

## 2017-09-13 DIAGNOSIS — Z1389 Encounter for screening for other disorder: Secondary | ICD-10-CM

## 2017-09-13 DIAGNOSIS — Z3A39 39 weeks gestation of pregnancy: Secondary | ICD-10-CM

## 2017-09-13 DIAGNOSIS — Z331 Pregnant state, incidental: Secondary | ICD-10-CM

## 2017-09-13 DIAGNOSIS — Z3483 Encounter for supervision of other normal pregnancy, third trimester: Secondary | ICD-10-CM

## 2017-09-13 LAB — POCT URINALYSIS DIPSTICK OB
Blood, UA: NEGATIVE
Glucose, UA: NEGATIVE — AB
Ketones, UA: NEGATIVE
NITRITE UA: NEGATIVE

## 2017-09-13 NOTE — Progress Notes (Signed)
   LOW-RISK PREGNANCY VISIT Patient name: [name] MRN [id number]  Date of birth: Nov 27, 1985 Chief Complaint:   Routine Prenatal Visit (pressure)  History of Present Illness:   [name] is a 32 y.o. [phone] female at 1462w0d with an Estimated Date of Delivery: 09/20/17 being seen today for ongoing management of a low-risk pregnancy.  Today she reports no complaints. Contractions: Not present.  .  Movement: Present. denies leaking of fluid. Review of Systems:   Pertinent items are noted in HPI Denies abnormal vaginal discharge w/ itching/odor/irritation, headaches, visual changes, shortness of breath, chest pain, abdominal pain, severe nausea/vomiting, or problems with urination or bowel movements unless otherwise stated above. Pertinent History Reviewed:  Reviewed past medical,surgical, social, obstetrical and family history.  Reviewed problem list, medications and allergies. Physical Assessment:   Vitals:   09/13/17 1629  BP: 114/68  Pulse: (!) 107  Weight: 193 lb 9.6 oz (87.8 kg)  Body mass index is 35.41 kg/m.        Physical Examination:   General appearance: Well appearing, and in no distress  Mental status: Alert, oriented to person, place, and time  Skin: Warm & dry  Cardiovascular: Normal heart rate noted  Respiratory: Normal respiratory effort, no distress  Abdomen: Soft, gravid, nontender  Pelvic: Cervical exam performed  Dilation: 1 Effacement (%): Thick Station: -2  Extremities: Edema: Trace  Fetal Status: Fetal Heart Rate (bpm): 135 Fundal Height: 37 cm Movement: Present Presentation: Vertex  Results for orders placed or performed in visit on 09/13/17 (from the past 24 hour(s))  POC Urinalysis Dipstick OB   Collection Time: 09/13/17  4:27 PM  Result Value Ref Range   Color, UA     Clarity, UA     Glucose, UA Negative (A) (none)   Bilirubin, UA     Ketones, UA neg    Spec Grav, UA     Blood, UA neg    pH, UA     POC Protein UA Trace  Negative, Trace   Urobilinogen, UA     Nitrite, UA neg    Leukocytes, UA Moderate (2+) (A) Negative   Appearance     Odor      Assessment & Plan:  1) Low-risk pregnancy [id number] at 6762w0d with an Estimated Date of Delivery: 09/20/17    Meds: No orders of the defined types were placed in this encounter.  Labs/procedures today: sve  Plan:  Continue routine obstetrical care   Reviewed: Term labor symptoms and general obstetric precautions including but not limited to vaginal bleeding, contractions, leaking of fluid and fetal movement were reviewed in detail with the patient.  All questions were answered  Follow-up: Return in about 1 week (around 09/20/2017) for LROB.  Orders Placed This Encounter  Procedures  . POC Urinalysis Dipstick OB   Cheral MarkerKimberly R Sibel Khurana CNM, [facility] 09/13/2017 4:54 PM

## 2017-09-13 NOTE — Patient Instructions (Signed)
Lynda RainwaterQuinta L Fuson, I greatly value your feedback.  If you receive a survey following your visit with us today, we appreciate you taking the time to fill it out.  Thanks, Joellyn HaffKim Murvin Gift, CNM, WHNP-BC   Call the office [phone]) or go to [facility] if:  You begin to have strong, frequent contractions  Your water breaks.  Sometimes it is a big gush of fluid, sometimes it is just a trickle that keeps getting your panties wet or running down your legs  You have vaginal bleeding.  It is normal to have a small amount of spotting if your cervix was checked.   You don't feel your baby moving like normal.  If you don't, get you something to eat and drink and lay down and focus on feeling your baby move.  You should feel at least 10 movements in 2 hours.  If you don't, you should call the office or [facility] to [facility].     [facility] Contractions Contractions of the uterus can occur throughout pregnancy, but they are not always a sign that you are in labor. You may have practice contractions called Braxton Hicks contractions. These false labor contractions are sometimes confused with true labor. What are Deberah PeltonBraxton Hicks contractions? Braxton Hicks contractions are tightening movements that occur in the muscles of the uterus before labor. Unlike true labor contractions, these contractions do not result in opening (dilation) and thinning of the cervix. Toward the end of pregnancy (32-34 weeks), Braxton Hicks contractions can happen more often and may become stronger. These contractions are sometimes difficult to tell apart from true labor because they can be very uncomfortable. You should not feel embarrassed if you go to the hospital with false labor. Sometimes, the only way to tell if you are in true labor is for your health care provider to look for changes in the cervix. The health care provider will do a physical exam and may monitor your contractions. If you are not in true labor, the exam should  show that your cervix is not dilating and your water has not broken. If there are other health problems associated with your pregnancy, it is completely safe for you to be sent home with false labor. You may continue to have Braxton Hicks contractions until you go into true labor. How to tell the difference between true labor and false labor True labor  Contractions last 30-70 seconds.  Contractions become very regular.  Discomfort is usually felt in the top of the uterus, and it spreads to the lower abdomen and low back.  Contractions do not go away with walking.  Contractions usually become more intense and increase in frequency.  The cervix dilates and gets thinner. False labor  Contractions are usually shorter and not as strong as true labor contractions.  Contractions are usually irregular.  Contractions are often felt in the front of the lower abdomen and in the groin.  Contractions may go away when you walk around or change positions while lying down.  Contractions get weaker and are shorter-lasting as time goes on.  The cervix usually does not dilate or become thin. Follow these instructions at home:  Take over-the-counter and prescription medicines only as told by your health care provider.  Keep up with your usual exercises and follow other instructions from your health care provider.  Eat and drink lightly if you think you are going into labor.  If Braxton Hicks contractions are making you uncomfortable: ? Change your position from lying  down or resting to walking, or change from walking to resting. ? Sit and rest in a tub of warm water. ? Drink enough fluid to keep your urine pale yellow. Dehydration may cause these contractions. ? Do slow and deep breathing several times an hour.  Keep all follow-up prenatal visits as told by your health care provider. This is important. Contact a health care provider if:  You have a fever.  You have continuous pain in  your abdomen. Get help right away if:  Your contractions become stronger, more regular, and closer together.  You have fluid leaking or gushing from your vagina.  You pass blood-tinged mucus (bloody show).  You have bleeding from your vagina.  You have low back pain that you never had before.  You feel your baby's head pushing down and causing pelvic pressure.  Your baby is not moving inside you as much as it used to. Summary  Contractions that occur before labor are called Braxton Hicks contractions, false labor, or practice contractions.  Braxton Hicks contractions are usually shorter, weaker, farther apart, and less regular than true labor contractions. True labor contractions usually become progressively stronger and regular and they become more frequent.  Manage discomfort from [facility] contractions by changing position, resting in a warm bath, drinking plenty of water, or practicing deep breathing. This information is not intended to replace advice given to you by your health care provider. Make sure you discuss any questions you have with your health care provider. Document Released: 05/25/2016 Document Revised: 05/25/2016 Document Reviewed: 05/25/2016 Elsevier Interactive Patient Education  2018 [employer].

## 2017-09-20 ENCOUNTER — Encounter: Payer: Self-pay | Admitting: Obstetrics and Gynecology

## 2017-09-20 ENCOUNTER — Ambulatory Visit (INDEPENDENT_AMBULATORY_CARE_PROVIDER_SITE_OTHER): Payer: Medicaid Other | Admitting: Obstetrics and Gynecology

## 2017-09-20 ENCOUNTER — Other Ambulatory Visit: Payer: Self-pay

## 2017-09-20 ENCOUNTER — Other Ambulatory Visit: Payer: Self-pay | Admitting: Obstetrics and Gynecology

## 2017-09-20 ENCOUNTER — Other Ambulatory Visit (HOSPITAL_COMMUNITY): Payer: Self-pay | Admitting: Advanced Practice Midwife

## 2017-09-20 VITALS — BP 133/71 | HR 98 | Wt 194.0 lb

## 2017-09-20 DIAGNOSIS — Z1389 Encounter for screening for other disorder: Secondary | ICD-10-CM

## 2017-09-20 DIAGNOSIS — Z3A40 40 weeks gestation of pregnancy: Secondary | ICD-10-CM | POA: Diagnosis not present

## 2017-09-20 DIAGNOSIS — Z3483 Encounter for supervision of other normal pregnancy, third trimester: Secondary | ICD-10-CM | POA: Diagnosis not present

## 2017-09-20 DIAGNOSIS — Z331 Pregnant state, incidental: Secondary | ICD-10-CM

## 2017-09-20 LAB — POCT URINALYSIS DIPSTICK OB
Blood, UA: NEGATIVE
GLUCOSE, UA: NEGATIVE
KETONES UA: NEGATIVE
Nitrite, UA: NEGATIVE

## 2017-09-20 NOTE — Progress Notes (Signed)
Patient ID: [name] L [place], female   DOB: 1985-10-27, 32 y.o.   MRN: [id number]  LOW-RISK PREGNANCY VISIT Patient name: [name] L Oran MRN [id number]  Date of birth: 1985-10-27 Chief Complaint:   Routine Prenatal Visit  History of Present Illness:   [name] L Mcguffee is a 32 y.o. [id number] female at 6239w0d with an Estimated Date of Delivery: 09/20/17 being seen today for ongoing management of a low-risk pregnancy. Now approaching postdates. Today she reports no complaints. Her first child accompanied her to the appointment. She plans on using Depo for birth control after this child and is not sure if she will bottle or breast feed.   Contractions: Irregular. Vag. Bleeding: None.  Movement: Present. denies leaking of fluid. Review of Systems:   Pertinent items are noted in HPI Denies abnormal vaginal discharge w/ itching/odor/irritation, headaches, visual changes, shortness of breath, chest pain, abdominal pain, severe nausea/vomiting, or problems with urination or bowel movements unless otherwise stated above. Pertinent History Reviewed:  Reviewed past medical,surgical, social, obstetrical and family history.  Reviewed problem list, medications and allergies. Physical Assessment:   Vitals:   09/20/17 1545  BP: 133/71  Pulse: 98  Weight: 194 lb (88 kg)  Body mass index is 35.48 kg/m.        Physical Examination:   General appearance: Well appearing, and in no distress  Mental status: Alert, oriented to person, place, and time  Skin: Warm & dry  Cardiovascular: Normal heart rate noted  Respiratory: Normal respiratory effort, no distress  Abdomen: Soft, gravid, nontender  Pelvic: cervical exam performed, posterior, low, fingertip dilated, -1 station     Station: -1 20%vertex  Extremities: Edema: Trace  Fetal Status:   Fundal Height: 36 cm Movement: Present Presentation: Vertex  Results for orders placed or performed in visit on 09/20/17 (from the past 24 hour(s))  POC  Urinalysis Dipstick OB   Collection Time: 09/20/17  3:44 PM  Result Value Ref Range   Color, UA     Clarity, UA     Glucose, UA Negative Negative   Bilirubin, UA     Ketones, UA neg    Spec Grav, UA     Blood, UA neg    pH, UA     POC Protein UA Trace Negative, Trace   Urobilinogen, UA     Nitrite, UA neg    Leukocytes, UA Small (1+) (A) Negative   Appearance     Odor      Assessment & Plan:  1) Low-risk pregnancy [id number] at 9139w0d with an Estimated Date of Delivery: 09/20/17    Plan:   1) Continue routine obstetrical care  2) IOL 09/23/2017 @ 07:00 -- pt declined foley bulb placement offer  the day before in MAU Will admit Sunday am., probable Foley placement. Meds: No orders of the defined types were placed in this encounter.  Labs/procedures today: none  Reviewed: Term labor symptoms and general obstetric precautions including but not limited to vaginal bleeding, contractions, leaking of fluid and fetal movement were reviewed in detail with the patient.  All questions were answered  Follow-up: Return in about 4 weeks (around 10/18/2017) for Postpartum chk.  Orders Placed This Encounter  Procedures  . POC Urinalysis Dipstick OB   By signing my name below, I, Pietro CassisEmily Tufford, attest that this documentation has been prepared under the direction and in the presence of Tilda BurrowFerguson, Creedence Heiss V, MD. Electronically Signed: Pietro CassisEmily Tufford, Medical Scribe. 09/20/17. 4:24 PM.  I personally performed the services described  in this documentation, which was SCRIBED in my presence. The recorded information has been reviewed and considered accurate. It has been edited as necessary during review. Tilda Burrow, MD

## 2017-09-21 ENCOUNTER — Encounter (HOSPITAL_COMMUNITY): Payer: Self-pay | Admitting: *Deleted

## 2017-09-21 ENCOUNTER — Telehealth (HOSPITAL_COMMUNITY): Payer: Self-pay | Admitting: *Deleted

## 2017-09-21 NOTE — Telephone Encounter (Signed)
Preadmission screen  

## 2017-09-23 ENCOUNTER — Other Ambulatory Visit: Payer: Self-pay

## 2017-09-23 ENCOUNTER — Inpatient Hospital Stay (HOSPITAL_COMMUNITY): Admission: RE | Admit: 2017-09-23 | Payer: Self-pay | Source: Ambulatory Visit

## 2017-09-23 ENCOUNTER — Inpatient Hospital Stay (HOSPITAL_COMMUNITY)
Admission: EM | Admit: 2017-09-23 | Discharge: 2017-09-26 | DRG: 807 | Disposition: A | Discharge status: Home / Self Care | Payer: Medicaid Other | Attending: Obstetrics and Gynecology | Admitting: Obstetrics and Gynecology

## 2017-09-23 ENCOUNTER — Encounter (HOSPITAL_COMMUNITY): Payer: Self-pay | Admitting: *Deleted

## 2017-09-23 DIAGNOSIS — O99334 Smoking (tobacco) complicating childbirth: Secondary | ICD-10-CM | POA: Diagnosis present

## 2017-09-23 DIAGNOSIS — Z91040 Latex allergy status: Secondary | ICD-10-CM

## 2017-09-23 DIAGNOSIS — F17210 Nicotine dependence, cigarettes, uncomplicated: Secondary | ICD-10-CM | POA: Diagnosis present

## 2017-09-23 DIAGNOSIS — O99824 Streptococcus B carrier state complicating childbirth: Secondary | ICD-10-CM | POA: Diagnosis present

## 2017-09-23 DIAGNOSIS — O480 Post-term pregnancy: Principal | ICD-10-CM | POA: Diagnosis present

## 2017-09-23 DIAGNOSIS — Z3A40 40 weeks gestation of pregnancy: Secondary | ICD-10-CM

## 2017-09-23 DIAGNOSIS — Z3483 Encounter for supervision of other normal pregnancy, third trimester: Secondary | ICD-10-CM

## 2017-09-23 DIAGNOSIS — Z3490 Encounter for supervision of normal pregnancy, unspecified, unspecified trimester: Secondary | ICD-10-CM

## 2017-09-23 LAB — CBC
HCT: 26.6 % — ABNORMAL LOW (ref 36.0–46.0)
Hemoglobin: 8.9 g/dL — ABNORMAL LOW (ref 12.0–15.0)
MCH: 29.6 pg (ref 26.0–34.0)
MCHC: 33.5 g/dL (ref 30.0–36.0)
MCV: 88.4 fL (ref 78.0–100.0)
PLATELETS: 272 10*3/uL (ref 150–400)
RBC: 3.01 MIL/uL — AB (ref 3.87–5.11)
RDW: 13.2 % (ref 11.5–15.5)
WBC: 10 10*3/uL (ref 4.0–10.5)

## 2017-09-23 LAB — TYPE AND SCREEN
ABO/RH(D): B POS
Antibody Screen: NEGATIVE

## 2017-09-23 LAB — ABO/RH: ABO/RH(D): B POS

## 2017-09-23 MED ORDER — OXYCODONE-ACETAMINOPHEN 5-325 MG PO TABS: 1.0000 | ORAL_TABLET | ORAL | Status: DC | PRN

## 2017-09-23 MED ORDER — ACETAMINOPHEN 325 MG PO TABS: 650.0000 mg | ORAL_TABLET | ORAL | Status: DC | PRN

## 2017-09-23 MED ORDER — OXYTOCIN BOLUS FROM INFUSION: 500.0000 mL | Freq: Once | INTRAVENOUS | Status: DC

## 2017-09-23 MED ORDER — LACTATED RINGERS IV SOLN
INTRAVENOUS | Status: DC
  Administered 2017-09-24 – 2017-09-25 (×2): via INTRAVENOUS

## 2017-09-23 MED ORDER — TERBUTALINE SULFATE 1 MG/ML IJ SOLN
0.2500 mg | Freq: Once | INTRAMUSCULAR | Status: DC | PRN
  Filled 2017-09-23: qty 1

## 2017-09-23 MED ORDER — LACTATED RINGERS IV SOLN: 500.0000 mL | INTRAVENOUS | Status: DC | PRN

## 2017-09-23 MED ORDER — SODIUM CHLORIDE 0.9 % IV SOLN
5.0000 10*6.[IU] | Freq: Once | INTRAVENOUS | Status: AC
  Administered 2017-09-23: 5 10*6.[IU] via INTRAVENOUS
  Filled 2017-09-23: qty 5

## 2017-09-23 MED ORDER — OXYCODONE-ACETAMINOPHEN 5-325 MG PO TABS: 2.0000 | ORAL_TABLET | ORAL | Status: DC | PRN

## 2017-09-23 MED ORDER — OXYTOCIN 40 UNITS IN LACTATED RINGERS INFUSION - SIMPLE MED: 2.5000 [IU]/h | INTRAVENOUS | Status: DC

## 2017-09-23 MED ORDER — OXYTOCIN 40 UNITS IN LACTATED RINGERS INFUSION - SIMPLE MED
1.0000 m[IU]/min | INTRAVENOUS | Status: DC
  Administered 2017-09-23 – 2017-09-24 (×2): 2 m[IU]/min via INTRAVENOUS
  Filled 2017-09-23: qty 1000

## 2017-09-23 MED ORDER — LIDOCAINE HCL (PF) 1 % IJ SOLN: 30.0000 mL | INTRAMUSCULAR | Status: DC | PRN

## 2017-09-23 MED ORDER — ONDANSETRON HCL 4 MG/2ML IJ SOLN: 4.0000 mg | Freq: Four times a day (QID) | INTRAMUSCULAR | Status: DC | PRN

## 2017-09-23 MED ORDER — OXYTOCIN BOLUS FROM INFUSION
500.0000 mL | Freq: Once | INTRAVENOUS | Status: AC
  Administered 2017-09-25: 500 mL via INTRAVENOUS

## 2017-09-23 MED ORDER — LACTATED RINGERS IV SOLN
INTRAVENOUS | Status: DC
  Administered 2017-09-23 – 2017-09-24 (×3): via INTRAVENOUS

## 2017-09-23 MED ORDER — FLEET ENEMA 7-19 GM/118ML RE ENEM: 1.0000 | ENEMA | RECTAL | Status: DC | PRN

## 2017-09-23 MED ORDER — SOD CITRATE-CITRIC ACID 500-334 MG/5ML PO SOLN: 30.0000 mL | ORAL | Status: DC | PRN

## 2017-09-23 MED ORDER — FENTANYL CITRATE (PF) 100 MCG/2ML IJ SOLN
50.0000 ug | INTRAMUSCULAR | Status: DC | PRN
  Administered 2017-09-23 (×2): 100 ug via INTRAVENOUS
  Administered 2017-09-23: 50 ug via INTRAVENOUS
  Administered 2017-09-24 (×6): 100 ug via INTRAVENOUS
  Filled 2017-09-23 (×9): qty 2

## 2017-09-23 MED ORDER — PENICILLIN G 3 MILLION UNITS IVPB - SIMPLE MED
3.0000 10*6.[IU] | INTRAVENOUS | Status: DC
  Administered 2017-09-23 – 2017-09-25 (×8): 3 10*6.[IU] via INTRAVENOUS
  Filled 2017-09-23 (×2): qty 100
  Filled 2017-09-23 (×2): qty 3
  Filled 2017-09-23: qty 100
  Filled 2017-09-23 (×2): qty 3
  Filled 2017-09-23: qty 100
  Filled 2017-09-23 (×3): qty 3

## 2017-09-23 MED ORDER — HYDROXYZINE HCL 50 MG PO TABS
50.0000 mg | ORAL_TABLET | Freq: Four times a day (QID) | ORAL | Status: DC | PRN
  Filled 2017-09-23: qty 1

## 2017-09-23 NOTE — Progress Notes (Signed)
Patient ID: [name], female   DOB: 1985-08-19, 32 y.o.   MRN: [id number] Doing well Had an episode of uterine tachysystole  Pitocin turned off for 30 minutes Will restart 1x1   Dilation: 2 Effacement (%): 70 Cervical Position: Posterior Station: -1 Presentation: Vertex Exam by:: Lahoma Crocker, RN / Reginold Agent, RN

## 2017-09-23 NOTE — H&P (Addendum)
[name] is a 32 y.o. female [phone] @ 40.3 wks presenting for elective IOL.GBS pos. OB History    Gravida  7   Para  1   Term  1   Preterm      AB  5   Living  1     SAB  2   TAB  3   Ectopic      Multiple      Live Births  1          Past Medical History:  Diagnosis Date  . Abnormal pap 10/2011  . Abnormal Pap smear   . Abortion   . Anxiety   . Anxiety   . Asthma   . Back pain 11/18/2012  . History of abnormal cervical Pap smear 01/13/2014  . History of bronchitis   . History of chlamydia   . History of gonorrhea   . History of trichomoniasis   . Mental disorder    anxiety  . Nausea and vomiting 11/18/2012  . Nausea and vomiting during pregnancy 06/11/2015  . Pregnant 06/11/2015  . Urge incontinence 01/13/2014  . Vaginal Pap smear, abnormal    Past Surgical History:  Procedure Laterality Date  . ELECTIVE ABORTION    . MOUTH SURGERY     Family History: family history includes Asthma in her son; Cancer in her maternal aunt; Diabetes in her maternal grandmother; Fibroids in her mother; Hypertension in her maternal grandfather and maternal grandmother; Schizophrenia in her brother; Seizures in her brother. Social History:  reports that she has been smoking cigarettes. She has a 5.00 pack-year smoking history. She has never used smokeless tobacco. She reports that she does not drink alcohol or use drugs.     Maternal Diabetes: No Genetic Screening: Normal Maternal Ultrasounds/Referrals: Normal Fetal Ultrasounds or other Referrals:  None Maternal Substance Abuse:  No Significant Maternal Medications:  None Significant Maternal Lab Results:  None Other Comments:  None  Review of Systems  Constitutional: Negative.   HENT: Negative.   Eyes: Negative.   Respiratory: Negative.   Cardiovascular: Negative.   Gastrointestinal: Negative.   Genitourinary: Negative.   Musculoskeletal: Negative.   Skin: Negative.   Neurological: Negative.    Endo/Heme/Allergies: Negative.   Psychiatric/Behavioral: Negative.    Maternal Medical History:  Reason for admission: Elective IOL  Fetal activity: Perceived fetal activity is normal.   Last perceived fetal movement was within the past hour.    Prenatal complications: no prenatal complications Prenatal Complications - Diabetes: none.    Dilation: 1.5 Effacement (%): 70 Station: -2 Exam by:: Zerita Boers, CNM  Blood pressure 113/67, pulse 95, temperature 98.3 F (36.8 C), temperature source Oral, resp. rate 16, height 5\' 2"  (1.575 m), weight 88.8 kg, last menstrual period 12/14/2016, unknown if currently breastfeeding. Maternal Exam:  Uterine Assessment: Contraction strength is mild.  Contraction frequency is rare.   Abdomen: Patient reports no abdominal tenderness. Fetal presentation: vertex  Introitus: Normal vulva. Normal vagina.  Ferning test: not done.  Nitrazine test: not done. Amniotic fluid character: not assessed.  Pelvis: adequate for delivery.   Cervix: Cervix evaluated by digital exam.     Fetal Exam Fetal Monitor Review: Mode: ultrasound.   Variability: moderate (6-25 bpm).   Pattern: accelerations present and no decelerations.    Fetal State Assessment: Category I - tracings are normal.     Physical Exam  Constitutional: She is oriented to person, place, and time. She appears well-developed and well-nourished.  HENT:  Head:  Normocephalic.  Neck: Normal range of motion.  Cardiovascular: Normal rate, regular rhythm, normal heart sounds and intact distal pulses.  Respiratory: Effort normal and breath sounds normal.  GI: Soft. Bowel sounds are normal.  Genitourinary: Vagina normal and uterus normal.  Musculoskeletal: Normal range of motion.  Neurological: She is alert and oriented to person, place, and time. She has normal reflexes.  Skin: Skin is warm and dry.  Psychiatric: She has a normal mood and affect. Her behavior is normal. Judgment and  thought content normal.    Prenatal labs: ABO, Rh:   Antibody: Negative (05/31 1014) Rubella: 6.16 (02/21 1618) RPR: Non Reactive (05/31 1014)  HBsAg: Negative (02/21 1618)  HIV: Non Reactive (05/31 1014)  GBS: Positive (08/09 1430)   Assessment/Plan: preg 40.3 elective IOL GBS pos SVE 2-3/70/-2 Pitocin induction of labor   Wyvonnia Dusky 09/23/2017, 4:16 PM

## 2017-09-23 NOTE — Anesthesia Pain Management Evaluation Note (Signed)
  CRNA Pain Management Visit Note  Patient: [name], 32 y.o., female  "Hello I am a member of the anesthesia team at [facility]. We have an anesthesia team available at all times to provide care throughout the hospital, including epidural management and anesthesia for C-section. I don't know your plan for the delivery whether it a natural birth, water birth, IV sedation, nitrous supplementation, doula or epidural, but we want to meet your pain goals."   1.Was your pain managed to your expectations on prior hospitalizations?   Yes   2.What is your expectation for pain management during this hospitalization?     Epidural and IV pain meds  3.How can we help you reach that goal? *support**  Record the patient's initial score and the patient's pain goal.   Pain: 3  Pain Goal: 6 [facility] wants you to be able to say your pain was always managed very well.  Trellis Paganini 09/23/2017

## 2017-09-24 LAB — RPR: RPR: NONREACTIVE

## 2017-09-24 MED ORDER — EPHEDRINE 5 MG/ML INJ
10.0000 mg | INTRAVENOUS | Status: DC | PRN
  Filled 2017-09-24: qty 2

## 2017-09-24 MED ORDER — FENTANYL 2.5 MCG/ML BUPIVACAINE 1/10 % EPIDURAL INFUSION (WH - ANES)
14.0000 mL/h | INTRAMUSCULAR | Status: DC | PRN
  Administered 2017-09-25: 14 mL/h via EPIDURAL
  Filled 2017-09-24: qty 100

## 2017-09-24 MED ORDER — PHENYLEPHRINE 40 MCG/ML (10ML) SYRINGE FOR IV PUSH (FOR BLOOD PRESSURE SUPPORT)
80.0000 ug | PREFILLED_SYRINGE | INTRAVENOUS | Status: DC | PRN
  Filled 2017-09-24: qty 10
  Filled 2017-09-24: qty 5

## 2017-09-24 MED ORDER — MISOPROSTOL 50MCG HALF TABLET
50.0000 ug | ORAL_TABLET | Freq: Once | ORAL | Status: AC
  Administered 2017-09-24: 50 ug via ORAL
  Filled 2017-09-24: qty 1

## 2017-09-24 MED ORDER — PHENYLEPHRINE 40 MCG/ML (10ML) SYRINGE FOR IV PUSH (FOR BLOOD PRESSURE SUPPORT)
80.0000 ug | PREFILLED_SYRINGE | INTRAVENOUS | Status: DC | PRN
  Filled 2017-09-24: qty 5

## 2017-09-24 MED ORDER — DIPHENHYDRAMINE HCL 50 MG/ML IJ SOLN: 12.5000 mg | INTRAMUSCULAR | Status: DC | PRN

## 2017-09-24 MED ORDER — LACTATED RINGERS IV SOLN
500.0000 mL | Freq: Once | INTRAVENOUS | Status: AC
  Administered 2017-09-25: 500 mL via INTRAVENOUS

## 2017-09-24 NOTE — Progress Notes (Signed)
Patient ID: [name], female   DOB: September 27, 1985, 32 y.o.   MRN: [id number] Attempted Foley bulb insertion  Unable to advance it far enough to inflate  Vitals:   09/24/17 0030 09/24/17 0100 09/24/17 0130 09/24/17 0200  BP: (!) 87/69 (!) 100/51 (!) 104/52 (!) 91/53  Pulse: 80 79 84 78  Resp: 16  16 16   Temp:      TempSrc:      Weight:      Height:       FHR stable UCs every 2-5 min   Pitocin continues Discussed will keep using Pit to advance labor but that it may still take a while.

## 2017-09-24 NOTE — Progress Notes (Signed)
Patient ID: BRENLY LISSY, female   DOB: 03-Jul-1985, 32 y.o.   MRN: [id number] NEISHA REAMES is a 32 y.o. [id number] at [redacted]w[redacted]d.  Subjective: Mild cramping w/ UC's.   Objective: BP 102/84   Pulse 85   Temp 97.8 F (36.6 C) (Oral)   Resp 18   Ht 5\' 2"  (1.575 m)   Wt 88.8 kg   LMP 12/14/2016 (Approximate)   BMI 35.81 kg/m    FHT:  FHR: 130 bpm, variability: mod,  accelerations:  15x15,  decelerations:  None UC:   Q 2-4 minutes, mild Dilation: 3 Effacement (%): 70 Cervical Position: Posterior Station: -2 Presentation: Vertex Exam by:: Garmon Dehn, cnm  Labs: Results for orders placed or performed during the hospital encounter of 09/23/17 (from the past 24 hour(s))  CBC     Status: Abnormal   Collection Time: 09/23/17  3:55 PM  Result Value Ref Range   WBC 10.0 4.0 - 10.5 K/uL   RBC 3.01 (L) 3.87 - 5.11 MIL/uL   Hemoglobin 8.9 (L) 12.0 - 15.0 g/dL   HCT 36.1 (L) 44.3 - 15.4 %   MCV 88.4 78.0 - 100.0 fL   MCH 29.6 26.0 - 34.0 pg   MCHC 33.5 30.0 - 36.0 g/dL   RDW 00.8 67.6 - 19.5 %   Platelets 272 150 - 400 K/uL  RPR     Status: None   Collection Time: 09/23/17  3:55 PM  Result Value Ref Range   RPR Ser Ql Non Reactive Non Reactive  Type and screen     Status: None   Collection Time: 09/23/17  3:55 PM  Result Value Ref Range   ABO/RH(D) B POS    Antibody Screen NEG    Sample Expiration      09/26/2017 Performed at [facility], [address], [place], [place] [address]   ABO/Rh     Status: None   Collection Time: 09/23/17  3:55 PM  Result Value Ref Range   ABO/RH(D)      B POS Performed at [facility], [address], [place], [place] [address]     Assessment / Plan: [redacted]w[redacted]d week IUP Labor: Early/IOL. Minimal progress on 20 of pit. Pt not even getting uncomfortable. Cervix not favorable enough for AROM. Will D/C pit  And give dose of Cytotec 50 mcg Buccal then restart pit in 4 hours.  Fetal Wellbeing:  Category I Pain Control:   None Anticipated MOD:  SVD NSL, light laboring diet, may shower and be off monitor.  Katrinka Blazing, [place], CNM 09/24/2017 2:26 PM

## 2017-09-25 ENCOUNTER — Encounter (HOSPITAL_COMMUNITY): Payer: Self-pay

## 2017-09-25 ENCOUNTER — Inpatient Hospital Stay (HOSPITAL_COMMUNITY): Payer: Medicaid Other | Admitting: Anesthesiology

## 2017-09-25 ENCOUNTER — Telehealth: Payer: Self-pay | Admitting: *Deleted

## 2017-09-25 DIAGNOSIS — O99824 Streptococcus B carrier state complicating childbirth: Secondary | ICD-10-CM

## 2017-09-25 DIAGNOSIS — O480 Post-term pregnancy: Secondary | ICD-10-CM

## 2017-09-25 DIAGNOSIS — Z3A40 40 weeks gestation of pregnancy: Secondary | ICD-10-CM

## 2017-09-25 MED ORDER — PRENATAL MULTIVITAMIN CH
1.0000 | ORAL_TABLET | Freq: Every day | ORAL | Status: DC
  Administered 2017-09-25 – 2017-09-26 (×2): 1 via ORAL
  Filled 2017-09-25 (×2): qty 1

## 2017-09-25 MED ORDER — COCONUT OIL OIL: 1.0000 "application " | TOPICAL_OIL | Status: DC | PRN

## 2017-09-25 MED ORDER — BENZOCAINE-MENTHOL 20-0.5 % EX AERO: 1.0000 "application " | INHALATION_SPRAY | CUTANEOUS | Status: DC | PRN

## 2017-09-25 MED ORDER — SODIUM BICARBONATE 8.4 % IV SOLN
INTRAVENOUS | Status: DC | PRN
  Administered 2017-09-25 (×2): 4 mL via EPIDURAL

## 2017-09-25 MED ORDER — DIPHENHYDRAMINE HCL 25 MG PO CAPS: 25.0000 mg | ORAL_CAPSULE | Freq: Four times a day (QID) | ORAL | Status: DC | PRN

## 2017-09-25 MED ORDER — IBUPROFEN 600 MG PO TABS
600.0000 mg | ORAL_TABLET | Freq: Four times a day (QID) | ORAL | Status: DC
  Administered 2017-09-25 – 2017-09-26 (×5): 600 mg via ORAL
  Filled 2017-09-25 (×5): qty 1

## 2017-09-25 MED ORDER — ONDANSETRON HCL 4 MG PO TABS: 4.0000 mg | ORAL_TABLET | ORAL | Status: DC | PRN

## 2017-09-25 MED ORDER — OXYCODONE HCL 5 MG PO TABS
5.0000 mg | ORAL_TABLET | Freq: Once | ORAL | Status: AC
  Administered 2017-09-25: 5 mg via ORAL
  Filled 2017-09-25: qty 1

## 2017-09-25 MED ORDER — DIBUCAINE 1 % RE OINT: 1.0000 "application " | TOPICAL_OINTMENT | RECTAL | Status: DC | PRN

## 2017-09-25 MED ORDER — LIDOCAINE HCL (PF) 1 % IJ SOLN
INTRAMUSCULAR | Status: AC
  Filled 2017-09-25: qty 30

## 2017-09-25 MED ORDER — SENNOSIDES-DOCUSATE SODIUM 8.6-50 MG PO TABS
2.0000 | ORAL_TABLET | ORAL | Status: DC
  Administered 2017-09-26: 2 via ORAL
  Filled 2017-09-25: qty 2

## 2017-09-25 MED ORDER — ZOLPIDEM TARTRATE 5 MG PO TABS: 5.0000 mg | ORAL_TABLET | Freq: Every evening | ORAL | Status: DC | PRN

## 2017-09-25 MED ORDER — TETANUS-DIPHTH-ACELL PERTUSSIS 5-2.5-18.5 LF-MCG/0.5 IM SUSP: 0.5000 mL | Freq: Once | INTRAMUSCULAR | Status: DC

## 2017-09-25 MED ORDER — WITCH HAZEL-GLYCERIN EX PADS: 1.0000 "application " | MEDICATED_PAD | CUTANEOUS | Status: DC | PRN

## 2017-09-25 MED ORDER — SIMETHICONE 80 MG PO CHEW: 80.0000 mg | CHEWABLE_TABLET | ORAL | Status: DC | PRN

## 2017-09-25 MED ORDER — BUPIVACAINE HCL (PF) 0.25 % IJ SOLN
INTRAMUSCULAR | Status: DC | PRN
  Administered 2017-09-25: 8 mL via EPIDURAL

## 2017-09-25 MED ORDER — ONDANSETRON HCL 4 MG/2ML IJ SOLN: 4.0000 mg | INTRAMUSCULAR | Status: DC | PRN

## 2017-09-25 MED ORDER — ACETAMINOPHEN 325 MG PO TABS
650.0000 mg | ORAL_TABLET | ORAL | Status: DC | PRN
  Administered 2017-09-25: 650 mg via ORAL
  Filled 2017-09-25: qty 2

## 2017-09-25 NOTE — Progress Notes (Signed)
Patient ID: [name], female   DOB: 16-Jul-1985, 32 y.o.   MRN: [id number] Dr Emelda Fear AROM'ed her at 2342 for clear fluid UCs got more painful and now she has an epidural  Vitals:   09/25/17 0100 09/25/17 0101 09/25/17 0105 09/25/17 0111  BP:  94/82 117/65 (!) 110/51  Pulse:  77 79 84  Resp:   16   Temp:      TempSrc:      SpO2: 100%  100%   Weight:      Height:       FHR reactive UCs q 2-3 min  Dilation: 4.5 Effacement (%): 70, 80 Cervical Position: Posterior Station: -2 Presentation: Vertex Exam by:: Lahoma Crocker, RN  Will continue to observe

## 2017-09-25 NOTE — Telephone Encounter (Signed)
Lmom for pt to call us back to set up pp appointment.  09-25-17  AS

## 2017-09-25 NOTE — Anesthesia Postprocedure Evaluation (Signed)
Anesthesia Post Note  Patient: [name]  Procedure(s) Performed: AN AD HOC LABOR EPIDURAL     Patient location during evaluation: Mother Baby Anesthesia Type: Epidural Level of consciousness: awake and alert and oriented Pain management: satisfactory to patient Vital Signs Assessment: post-procedure vital signs reviewed and stable Respiratory status: respiratory function stable Cardiovascular status: stable Postop Assessment: no headache, no backache, epidural receding, patient able to bend at knees, no signs of nausea or vomiting and adequate PO intake Anesthetic complications: no    Last Vitals:  Vitals:   09/25/17 0631 09/25/17 0655  BP: 127/73 115/66  Pulse: 84 78  Resp: 16 16  Temp:  (!) 38 C  SpO2:  100%    Last Pain:  Vitals:   09/25/17 0710  TempSrc:   PainSc: 0-No pain   Pain Goal:                 Sevin Langenbach

## 2017-09-25 NOTE — Anesthesia Preprocedure Evaluation (Signed)
Anesthesia Evaluation  Patient identified by MRN, date of birth, ID band Patient awake    Reviewed: Allergy & Precautions, NPO status , Patient's Chart, lab work & pertinent test results  Airway Mallampati: II  TM Distance: >3 FB Neck ROM: Full    Dental no notable dental hx.    Pulmonary asthma , Current Smoker,    Pulmonary exam normal breath sounds clear to auscultation       Cardiovascular negative cardio ROS Normal cardiovascular exam Rhythm:Regular Rate:Normal     Neuro/Psych Anxiety negative neurological ROS     GI/Hepatic negative GI ROS, Neg liver ROS,   Endo/Other  negative endocrine ROS  Renal/GU negative Renal ROS  negative genitourinary   Musculoskeletal negative musculoskeletal ROS (+)   Abdominal   Peds  Hematology negative hematology ROS (+)   Anesthesia Other Findings   Reproductive/Obstetrics (+) Pregnancy                             Anesthesia Physical Anesthesia Plan  ASA: II  Anesthesia Plan: Epidural   Post-op Pain Management:    Induction:   PONV Risk Score and Plan: Treatment may vary due to age or medical condition  Airway Management Planned: Natural Airway  Additional Equipment:   Intra-op Plan:   Post-operative Plan:   Informed Consent: I have reviewed the patients History and Physical, chart, labs and discussed the procedure including the risks, benefits and alternatives for the proposed anesthesia with the patient or authorized representative who has indicated his/her understanding and acceptance.     Plan Discussed with: Anesthesiologist  Anesthesia Plan Comments: (Patient identified. Risks, benefits, options discussed with patient including but not limited to bleeding, infection, nerve damage, paralysis, failed block, incomplete pain control, headache, blood pressure changes, nausea, vomiting, reactions to medication, itching, and post  partum back pain. Confirmed with bedside nurse the patient's most recent platelet count. Confirmed with the patient that they are not taking any anticoagulation, have any bleeding history or any family history of bleeding disorders. Patient expressed understanding and wishes to proceed. All questions were answered. )        Anesthesia Quick Evaluation

## 2017-09-25 NOTE — Anesthesia Procedure Notes (Signed)
Epidural Patient location during procedure: OB Start time: 09/25/2017 12:20 AM End time: 09/25/2017 12:30 AM  Staffing Anesthesiologist: Elmer Picker, MD Performed: anesthesiologist   Preanesthetic Checklist Completed: patient identified, pre-op evaluation, timeout performed, IV checked, risks and benefits discussed and monitors and equipment checked  Epidural Patient position: sitting Prep: site prepped and draped and DuraPrep Patient monitoring: continuous pulse ox, blood pressure, heart rate and cardiac monitor Approach: midline Location: L3-L4 Injection technique: LOR air  Needle:  Needle type: Tuohy  Needle gauge: 17 G Needle length: 9 cm Needle insertion depth: 5 cm Catheter type: closed end flexible Catheter size: 19 Gauge Catheter at skin depth: 10 cm Test dose: negative  Assessment Sensory level: T8 Events: blood not aspirated, injection not painful, no injection resistance, negative IV test and no paresthesia  Additional Notes Patient identified. Risks/Benefits/Options discussed with patient including but not limited to bleeding, infection, nerve damage, paralysis, failed block, incomplete pain control, headache, blood pressure changes, nausea, vomiting, reactions to medication both or allergic, itching and postpartum back pain. Confirmed with bedside nurse the patient's most recent platelet count. Confirmed with patient that they are not currently taking any anticoagulation, have any bleeding history or any family history of bleeding disorders. Patient expressed understanding and wished to proceed. All questions were answered. Sterile technique was used throughout the entire procedure. Please see nursing notes for vital signs. Test dose was given through epidural catheter and negative prior to continuing to dose epidural or start infusion. Warning signs of high block given to the patient including shortness of breath, tingling/numbness in hands, complete motor block,  or any concerning symptoms with instructions to call for help. Patient was given instructions on fall risk and not to get out of bed. All questions and concerns addressed with instructions to call with any issues or inadequate analgesia.  Reason for block:procedure for pain

## 2017-09-26 MED ORDER — SENNOSIDES-DOCUSATE SODIUM 8.6-50 MG PO TABS: 2.0000 | ORAL_TABLET | ORAL | 0 refills | Status: DC

## 2017-09-26 MED ORDER — OXYCODONE HCL 5 MG PO CAPS: 5.0000 mg | ORAL_CAPSULE | Freq: Four times a day (QID) | ORAL | 0 refills | Status: AC | PRN

## 2017-09-26 MED ORDER — IBUPROFEN 600 MG PO TABS: 600.0000 mg | ORAL_TABLET | Freq: Three times a day (TID) | ORAL | 0 refills | Status: DC | PRN

## 2017-09-26 NOTE — Discharge Summary (Signed)
Obstetrics Discharge Summary OB/GYN Faculty Practice   Patient Name: [name] DOB: Nov 24, 1985 MRN: [id number]  Date of admission: 09/23/2017 Delivering MD: Mirian Mo   Date of discharge: 09/26/2017  Admitting diagnosis: Induction Intrauterine pregnancy: [redacted]w[redacted]d     Secondary diagnosis:   Active Problems:   Post term pregnancy over 40 weeks   Term pregnancy  Additional problems:  . History of chlamydia, trichomonal vaginitis  . PTSD, Anxiety  . History of abnormal Pap smear     Discharge diagnosis: Term Pregnancy Delivered                                            Postpartum procedures: None  Complications: 1st degree labial laceration, no repair needed  Hospital course: [name] is a 32 y.o. [redacted]w[redacted]d who was admitted for elective IOL. Her pregnancy was complicated by history of STDs in pregnancy, PTSD, anxiety, and history of abnormal Pap smear. Her labor course was notable for period of tachysystole pausing pitocin for 30 minutes. Delivery was complicated by hemostatic 1st degree laceration. Please see delivery/op note for additional details. Her postpartum course was uncomplicated. By day of discharge, she was passing flatus, urinating, eating and drinking without difficulty. Her pain was well-controlled, and she was discharged home with ibuprofen and #20 oxycodone for breakthrough pain. She will follow-up in clinic in 4-6 weeks and is planning on depo for birth control.   Physical exam  Vitals:   09/25/17 1630 09/25/17 2030 09/25/17 2032 09/26/17 0553  BP: 138/73  109/69 127/66  Pulse: 78  83 78  Resp: 19  20 16   Temp: 98.5 F (36.9 C)  98.6 F (37 C) 98.2 F (36.8 C)  TempSrc: Oral  Oral Oral  SpO2:  100% 100% 100%  Weight:      Height:       General: well-appearing, sitting up in bed, NAD Lochia: appropriate Uterine Fundus: firm Incision: did not examine DVT Evaluation: No evidence of DVT seen on physical exam. Labs: Lab Results  Component Value  Date   WBC 10.0 09/23/2017   HGB 8.9 (L) 09/23/2017   HCT 26.6 (L) 09/23/2017   MCV 88.4 09/23/2017   PLT 272 09/23/2017   CMP Latest Ref Rng & Units 07/14/2015  Glucose 65 - 99 mg/dL 92  BUN 6 - 20 mg/dL 5(L)  Creatinine 6.55 - 1.00 mg/dL 3.74(M)  Sodium 270 - 786 mmol/L 133(L)  Potassium 3.5 - 5.1 mmol/L 3.7  Chloride 101 - 111 mmol/L 106  CO2 22 - 32 mmol/L 22  Calcium 8.9 - 10.3 mg/dL 7.5(Q)  Total Protein 6.5 - 8.1 g/dL 7.0  Total Bilirubin 0.3 - 1.2 mg/dL 0.6  Alkaline Phos 38 - 126 U/L 41  AST 15 - 41 U/L 23  ALT 14 - 54 U/L 26    Discharge instructions: Per After Visit Summary and "Baby and Me Booklet"  After visit meds:  Allergies as of 09/26/2017      Reactions   Strawberry Extract Hives   Latex Itching, Rash      Medication List    STOP taking these medications   Doxylamine-Pyridoxine 10-10 MG Tbec   ferrous sulfate 325 (65 FE) MG tablet   hydrOXYzine 25 MG tablet Commonly known as:  ATARAX/VISTARIL     TAKE these medications   acetaminophen 500 MG tablet Commonly known as:  TYLENOL Take 1,000  mg by mouth every 8 (eight) hours as needed for mild pain or headache.   albuterol 108 (90 Base) MCG/ACT inhaler Commonly known as:  PROVENTIL HFA;VENTOLIN HFA Inhale 2 puffs into the lungs every 6 (six) hours as needed for wheezing or shortness of breath.   ibuprofen 600 MG tablet Commonly known as:  ADVIL,MOTRIN Take 1 tablet (600 mg total) by mouth every 8 (eight) hours as needed for moderate pain or cramping.   omeprazole 20 MG capsule Commonly known as:  PRILOSEC Take 1 capsule (20 mg total) by mouth daily.   oxycodone 5 MG capsule Commonly known as:  OXY-IR Take 1 capsule (5 mg total) by mouth every 6 (six) hours as needed for up to 5 days.   PREPLUS 27-1 MG Tabs TAKE 1 TABLET BY MOUTH DAILY AT 12 NOON.   senna-docusate 8.6-50 MG tablet Commonly known as:  Senokot-S Take 2 tablets by mouth daily. Start taking on:  09/27/2017       Postpartum  contraception: Depo Provera, prefers to have at postpartum visit Diet: Routine Diet Activity: Advance as tolerated. Pelvic rest for 6 weeks.   Outpatient follow up:4-6 weeks Future Appointments  Date Time Provider Department Center  11/01/2017  2:30 PM Cresenzo-Dishmon, Scarlette Calico, CNM [facility]   Newborn Data: Live born female  Birth Weight: 7 lb 1.1 oz (3205 g) APGAR: 9, 9  Newborn Delivery   Birth date/time:  09/25/2017 04:48:00 Delivery type:  Vaginal, Spontaneous    Baby Feeding: Bottle Disposition:home with mother  Cristal Deer. Earlene Plater, DO OB/GYN Fellow, Faculty Practice

## 2017-09-26 NOTE — Progress Notes (Signed)
Post Partum Day #1 Subjective: Pt is a 32yo E3O1224 107w5d s/p SVD. Pt denies any nausea, vomiting, or abdominal pain. Urinating normally, has not had a BM. Lochia minimal. No other complaints. Subjective limited by extreme drowsiness and difficulty arousing pt.  Objective: Blood pressure 127/66, pulse 78, temperature 98.2 F (36.8 C), temperature source Oral, resp. rate 16, height 5\' 2"  (1.575 m), weight 88.8 kg, last menstrual period 12/14/2016, SpO2 100 %, unknown if currently breastfeeding.  Physical Exam:  General: Drowsy and difficult to arouse but cooperative and in NAD. Lochia: appropriate Uterine Fundus: firm Incision: N/A   DVT Evaluation: Negative Homan's sign. No cords or calf tenderness. No significant calf/ankle edema.  Recent Labs    09/23/17 1555  HGB 8.9*  HCT 26.6*    Assessment/Plan: Plan for discharge tomorrow. Lactation and contraception consults PRN.    LOS: 3 days   [name] 09/26/2017, 10:05 AM

## 2017-09-26 NOTE — Progress Notes (Signed)
[name] was referred for history of depression/anxiety. * Referral screened out by Clinical Social Worker because none of the following criteria appear to apply: ~ History of anxiety/depression during this pregnancy, or of post-partum depression following prior delivery. ~ Diagnosis of anxiety and/or depression within last 3 years OR * [name]'s symptoms currently being treated with medication and/or therapy. [name] has a Rx for Vistaril. No concerns noted in OB record.   Please contact the Clinical Social Worker if needs arise, by [name] request, or if [name] scores greater than 9/yes to question 10 on [place] Postpartum Depression Screen.  [name], MSW, LCSW Clinical Social Work [phone] 

## 2017-09-28 ENCOUNTER — Other Ambulatory Visit: Payer: Self-pay | Admitting: Obstetrics & Gynecology

## 2017-09-28 ENCOUNTER — Telehealth: Payer: Self-pay | Admitting: *Deleted

## 2017-09-28 NOTE — Telephone Encounter (Signed)
MCV is 88, just have her continue her prenatal, no additional iron is needed

## 2017-09-28 NOTE — Telephone Encounter (Signed)
I spoke with pt. She never got iron while pregnant. She states she can't afford to get OTC iron. Can you prescribe iron for her? Thanks!! JSY

## 2017-09-28 NOTE — Telephone Encounter (Signed)
Pt aware no need for additional iron per Dr. Despina Hidden. Pt voiced understanding. JSY

## 2017-09-28 NOTE — Telephone Encounter (Signed)
Johnetta with [facility] called with pt's hemoglobin 8.7 today. Vaginal delivery on 09/25/17. Pt still takes prenatal every day. Johnetta discussed right foods to eat. I left message for pt to call back advising to take iron. JSY

## 2017-11-01 ENCOUNTER — Ambulatory Visit: Payer: Medicaid Other | Admitting: Advanced Practice Midwife

## 2017-11-08 ENCOUNTER — Ambulatory Visit: Payer: Medicaid Other | Admitting: Advanced Practice Midwife

## 2017-11-16 ENCOUNTER — Encounter: Payer: Self-pay | Admitting: Women's Health

## 2017-11-16 ENCOUNTER — Ambulatory Visit (INDEPENDENT_AMBULATORY_CARE_PROVIDER_SITE_OTHER): Payer: Medicaid Other | Admitting: Women's Health

## 2017-11-16 DIAGNOSIS — O99345 Other mental disorders complicating the puerperium: Secondary | ICD-10-CM

## 2017-11-16 DIAGNOSIS — F53 Postpartum depression: Secondary | ICD-10-CM

## 2017-11-16 DIAGNOSIS — Z30013 Encounter for initial prescription of injectable contraceptive: Secondary | ICD-10-CM | POA: Diagnosis not present

## 2017-11-16 DIAGNOSIS — Z8659 Personal history of other mental and behavioral disorders: Secondary | ICD-10-CM | POA: Insufficient documentation

## 2017-11-16 MED ORDER — MEDROXYPROGESTERONE ACETATE 150 MG/ML IM SUSP: 150.0000 mg | INTRAMUSCULAR | 3 refills | Status: DC

## 2017-11-16 MED ORDER — SERTRALINE HCL 25 MG PO TABS: 25.0000 mg | ORAL_TABLET | Freq: Every day | ORAL | 6 refills | Status: DC

## 2017-11-16 NOTE — Patient Instructions (Signed)
NO SEX UNTIL AFTER YOU GET YOUR BIRTH CONTROL   Medroxyprogesterone injection [Contraceptive] What is this medicine? MEDROXYPROGESTERONE (me DROX ee proe JES te rone) contraceptive injections prevent pregnancy. They provide effective birth control for 3 months. Depo-subQ Provera 104 is also used for treating pain related to endometriosis. This medicine may be used for other purposes; ask your health care provider or pharmacist if you have questions. COMMON BRAND NAME(S): Depo-Provera, Depo-subQ Provera 104 What should I tell my health care provider before I take this medicine? They need to know if you have any of these conditions: -frequently drink alcohol -asthma -blood vessel disease or a history of a blood clot in the lungs or legs -bone disease such as osteoporosis -breast cancer -diabetes -eating disorder (anorexia nervosa or bulimia) -high blood pressure -HIV infection or AIDS -kidney disease -liver disease -mental depression -migraine -seizures (convulsions) -stroke -tobacco smoker -vaginal bleeding -an unusual or allergic reaction to medroxyprogesterone, other hormones, medicines, foods, dyes, or preservatives -pregnant or trying to get pregnant -breast-feeding How should I use this medicine? Depo-Provera Contraceptive injection is given into a muscle. Depo-subQ Provera 104 injection is given under the skin. These injections are given by a health care professional. You must not be pregnant before getting an injection. The injection is usually given during the first 5 days after the start of a menstrual period or 6 weeks after delivery of a baby. Talk to your pediatrician regarding the use of this medicine in children. Special care may be needed. These injections have been used in female children who have started having menstrual periods. Overdosage: If you think you have taken too much of this medicine contact a poison control center or emergency room at once. NOTE: This  medicine is only for you. Do not share this medicine with others. What if I miss a dose? Try not to miss a dose. You must get an injection once every 3 months to maintain birth control. If you cannot keep an appointment, call and reschedule it. If you wait longer than 13 weeks between Depo-Provera contraceptive injections or longer than 14 weeks between Depo-subQ Provera 104 injections, you could get pregnant. Use another method for birth control if you miss your appointment. You may also need a pregnancy test before receiving another injection. What may interact with this medicine? Do not take this medicine with any of the following medications: -bosentan This medicine may also interact with the following medications: -aminoglutethimide -antibiotics or medicines for infections, especially rifampin, rifabutin, rifapentine, and griseofulvin -aprepitant -barbiturate medicines such as phenobarbital or primidone -bexarotene -carbamazepine -medicines for seizures like ethotoin, felbamate, oxcarbazepine, phenytoin, topiramate -modafinil -St. John's wort This list may not describe all possible interactions. Give your health care provider a list of all the medicines, herbs, non-prescription drugs, or dietary supplements you use. Also tell them if you smoke, drink alcohol, or use illegal drugs. Some items may interact with your medicine. What should I watch for while using this medicine? This drug does not protect you against HIV infection (AIDS) or other sexually transmitted diseases. Use of this product may cause you to lose calcium from your bones. Loss of calcium may cause weak bones (osteoporosis). Only use this product for more than 2 years if other forms of birth control are not right for you. The longer you use this product for birth control the more likely you will be at risk for weak bones. Ask your health care professional how you can keep strong bones. You may have a change   in bleeding pattern  or irregular periods. Many females stop having periods while taking this drug. If you have received your injections on time, your chance of being pregnant is very low. If you think you may be pregnant, see your health care professional as soon as possible. Tell your health care professional if you want to get pregnant within the next year. The effect of this medicine may last a long time after you get your last injection. What side effects may I notice from receiving this medicine? Side effects that you should report to your doctor or health care professional as soon as possible: -allergic reactions like skin rash, itching or hives, swelling of the face, lips, or tongue -breast tenderness or discharge -breathing problems -changes in vision -depression -feeling faint or lightheaded, falls -fever -pain in the abdomen, chest, groin, or leg -problems with balance, talking, walking -unusually weak or tired -yellowing of the eyes or skin Side effects that usually do not require medical attention (report to your doctor or health care professional if they continue or are bothersome): -acne -fluid retention and swelling -headache -irregular periods, spotting, or absent periods -temporary pain, itching, or skin reaction at site where injected -weight gain This list may not describe all possible side effects. Call your doctor for medical advice about side effects. You may report side effects to FDA at 1-800-FDA-1088. Where should I keep my medicine? This does not apply. The injection will be given to you by a health care professional. NOTE: This sheet is a summary. It may not cover all possible information. If you have questions about this medicine, talk to your doctor, pharmacist, or health care provider.  2018 Elsevier/Gold Standard (2008-01-31 18:37:56)  

## 2017-11-16 NOTE — Progress Notes (Signed)
POSTPARTUM VISIT Patient name: [name] MRN [id number]  Date of birth: [birth year] Chief Complaint:   Postpartum Care  History of Present Illness:   [name] is a 32 y.o. [phone] African American female being seen today for a postpartum visit. She is 7 weeks postpartum following a spontaneous vaginal delivery at 40.5 gestational weeks after elective IOL. Anesthesia: epidural. I have fully reviewed the prenatal and intrapartum course. Pregnancy uncomplicated. Postpartum course has been complicated by back pain where epidural was. Bleeding no bleeding. Bowel function is normal. Bladder function is normal.  Patient is sexually active. Last sexual activity: last night.  Contraception method is coitus interruptus and wants depo.  [place] Postpartum Depression Screening: negative. Score 4.  But feels overwhelmed, has had a lot going on lately. Has h/o dep/anx, was on meds in past. Denies SI/HI/II. Declines counseling, but does feel she needs to get on meds.  Last pap 07/10/16.  Results were normal .  No LMP recorded.  Baby's course has been complicated by 2d hospital stay this past week d/t viral infection, now has ear infection. Baby is feeding by bottle.  Review of Systems:   Pertinent items are noted in HPI Denies Abnormal vaginal discharge w/ itching/odor/irritation, headaches, visual changes, shortness of breath, chest pain, abdominal pain, severe nausea/vomiting, or problems with urination or bowel movements. Pertinent History Reviewed:  Reviewed past medical,surgical, obstetrical and family history.  Reviewed problem list, medications and allergies. OB History  Gravida Para Term Preterm AB Living  7 2 2   5 2   SAB TAB Ectopic Multiple Live Births  2 3   0 2    # Outcome Date GA Lbr Len/2nd Weight Sex Delivery Anes PTL Lv  7 Term 09/25/17 [redacted]w[redacted]d / 01:27 7 lb 1.1 oz (3.205 kg) M Vag-Spont EPI  LIV     Birth Comments: WNL  6 TAB 07/2016          5 TAB 06/2015           4 SAB 02/2014          3 SAB 2015          2 TAB 10/2012          1 Term 01/11/08 [redacted]w[redacted]d  7 lb 10 oz (3.459 kg) M Vag-Spont EPI N LIV   Physical Assessment:   Vitals:   11/16/17 1301  BP: 125/80  Pulse: 84  Weight: 177 lb (80.3 kg)  Height: 5\' 2"  (1.575 m)  Body mass index is 32.37 kg/m.       Physical Examination:   General appearance: alert, well appearing, and in no distress  Mental status: alert, oriented to person, place, and time  Skin: warm & dry   Cardiovascular: normal heart rate noted   Respiratory: normal respiratory effort, no distress   Breasts: deferred, no complaints   Abdomen: soft, non-tender   Pelvic: VULVA: normal appearing vulva with no masses, tenderness or lesions, UTERUS: uterus is normal size, shape, consistency and nontender  Rectal: no hemorrhoids  Extremities: no edema       No results found for this or any previous visit (from the past 24 hour(s)).  Assessment & Plan:  1) Postpartum exam 2) 7 wks s/p SVB after elective IOL 3) Bottlefeeding 4) Depression screening 5) Contraception counseling, pt prefers abstinence until depo  6) H/O abnormal pap 7) PPD> rx zoloft 25mg  daily, understands can take a few weeks to notice improvement. Declines counseling. Discussed depo can potentially  worsen dep/anx- if so let us know  Meds:  Meds ordered this encounter  Medications  . medroxyPROGESTERone (DEPO-PROVERA) 150 MG/ML injection    Sig: Inject 1 mL (150 mg total) into the muscle every 3 (three) months.    Dispense:  1 mL    Refill:  3    Order Specific Question:   Supervising Provider    Answer:   EURE, LUTHER H [[id number]]  . sertraline (ZOLOFT) 25 MG tablet    Sig: Take 1 tablet (25 mg total) by mouth daily.    Dispense:  30 tablet    Refill:  6    Order Specific Question:   Supervising Provider    Answer:   Lazaro Arms [[id number]]    Follow-up: Return for 11/7 am for hcg, pm for depo; then 4wks from now for pap & physical and f/u on meds.   No  orders of the defined types were placed in this encounter.   Cheral Marker CNM, [facility] 11/16/2017 1:35 PM

## 2017-11-29 ENCOUNTER — Ambulatory Visit: Payer: Medicaid Other

## 2017-11-29 ENCOUNTER — Other Ambulatory Visit: Payer: Medicaid Other

## 2017-12-13 ENCOUNTER — Ambulatory Visit (INDEPENDENT_AMBULATORY_CARE_PROVIDER_SITE_OTHER): Payer: Medicaid Other

## 2017-12-13 VITALS — Ht 62.0 in | Wt 180.0 lb

## 2017-12-13 DIAGNOSIS — Z3042 Encounter for surveillance of injectable contraceptive: Secondary | ICD-10-CM | POA: Diagnosis not present

## 2017-12-13 DIAGNOSIS — Z3202 Encounter for pregnancy test, result negative: Secondary | ICD-10-CM | POA: Diagnosis not present

## 2017-12-13 LAB — POCT URINE PREGNANCY: PREG TEST UR: NEGATIVE

## 2017-12-13 MED ORDER — MEDROXYPROGESTERONE ACETATE 150 MG/ML IM SUSP
150.0000 mg | Freq: Once | INTRAMUSCULAR | Status: AC
  Administered 2017-12-13: 150 mg via INTRAMUSCULAR

## 2017-12-13 NOTE — Progress Notes (Signed)
Pt here for first depo injection 150 mg IM given lt deltoid. Tolerated well. LMP 12-09-17.return 12 weeks for next injection. Pad CMA

## 2018-03-07 ENCOUNTER — Ambulatory Visit: Payer: Medicaid Other

## 2018-03-13 ENCOUNTER — Ambulatory Visit: Payer: Self-pay

## 2018-03-25 NOTE — Telephone Encounter (Signed)
Note sent to nurse. 

## 2019-03-14 ENCOUNTER — Other Ambulatory Visit: Payer: Self-pay

## 2019-03-14 ENCOUNTER — Emergency Department (HOSPITAL_COMMUNITY)
Admission: EM | Admit: 2019-03-14 | Discharge: 2019-03-14 | Disposition: A | Discharge status: Home / Self Care | Payer: Self-pay | Attending: Emergency Medicine | Admitting: Emergency Medicine

## 2019-03-14 ENCOUNTER — Emergency Department (HOSPITAL_COMMUNITY): Payer: Self-pay

## 2019-03-14 ENCOUNTER — Encounter (HOSPITAL_COMMUNITY): Payer: Self-pay | Admitting: Emergency Medicine

## 2019-03-14 DIAGNOSIS — W19XXXA Unspecified fall, initial encounter: Secondary | ICD-10-CM

## 2019-03-14 DIAGNOSIS — Z79899 Other long term (current) drug therapy: Secondary | ICD-10-CM | POA: Insufficient documentation

## 2019-03-14 DIAGNOSIS — S82831A Other fracture of upper and lower end of right fibula, initial encounter for closed fracture: Secondary | ICD-10-CM | POA: Insufficient documentation

## 2019-03-14 DIAGNOSIS — J45909 Unspecified asthma, uncomplicated: Secondary | ICD-10-CM | POA: Insufficient documentation

## 2019-03-14 DIAGNOSIS — F17210 Nicotine dependence, cigarettes, uncomplicated: Secondary | ICD-10-CM | POA: Insufficient documentation

## 2019-03-14 DIAGNOSIS — Z91040 Latex allergy status: Secondary | ICD-10-CM | POA: Insufficient documentation

## 2019-03-14 DIAGNOSIS — Z793 Long term (current) use of hormonal contraceptives: Secondary | ICD-10-CM | POA: Insufficient documentation

## 2019-03-14 DIAGNOSIS — Y929 Unspecified place or not applicable: Secondary | ICD-10-CM | POA: Insufficient documentation

## 2019-03-14 DIAGNOSIS — Y9389 Activity, other specified: Secondary | ICD-10-CM | POA: Insufficient documentation

## 2019-03-14 DIAGNOSIS — W010XXA Fall on same level from slipping, tripping and stumbling without subsequent striking against object, initial encounter: Secondary | ICD-10-CM | POA: Insufficient documentation

## 2019-03-14 DIAGNOSIS — Y999 Unspecified external cause status: Secondary | ICD-10-CM | POA: Insufficient documentation

## 2019-03-14 MED ORDER — HYDROCODONE-ACETAMINOPHEN 5-325 MG PO TABS: 1.0000 | ORAL_TABLET | ORAL | 0 refills | Status: DC | PRN

## 2019-03-14 MED ORDER — IBUPROFEN 600 MG PO TABS: 600.0000 mg | ORAL_TABLET | Freq: Four times a day (QID) | ORAL | 0 refills | Status: DC | PRN

## 2019-03-14 NOTE — Discharge Instructions (Signed)
Elevate the leg as much as possible - "toes above the nose" to reduce swelling Do not put any weight on the right side - use crutches when getting around Take Ibuprofen 600mg  every 6-8 hours for pain Take Norco for severe pain Please follow up with Dr. - call his office on Monday for a follow up appointment next week

## 2019-03-14 NOTE — ED Triage Notes (Signed)
Patient fell while taking out the trash last night. Patient complains of right foot pain and is unable to bare weight on the right.

## 2019-03-14 NOTE — ED Provider Notes (Signed)
[facility] EMERGENCY DEPARTMENT Provider Note   CSN: [id number] Arrival date & time: 03/14/19  1447     History Chief Complaint  Patient presents with  . Fall    [name] is a 34 y.o. female who presents with a fall and R ankle pain. She states she was taking out the trash last night and tripped and fell and had immediate onset of R ankle pain. She is unsure of exact MOI. She has had worsening pain, swelling, and "soreness" of the ankle since then. She has had difficulty bearing weight due to pain so decided to come to the ED today. She denies numbness, tingling, or weakness of the foot. 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 She has not taken anything for pain or iced it  HPI     Past Medical History:  Diagnosis Date  . Abnormal pap 10/2011  . Abnormal Pap smear   . Abortion   . Anxiety   . Anxiety   . Asthma   . Back pain 11/18/2012  . History of abnormal cervical Pap smear 01/13/2014  . History of bronchitis   . History of chlamydia   . History of gonorrhea   . History of trichomoniasis   . Mental disorder    anxiety  . Nausea and vomiting 11/18/2012  . Nausea and vomiting during pregnancy 06/11/2015  . Pregnant 06/11/2015  . Urge incontinence 01/13/2014  . Vaginal Pap smear, abnormal     Patient Active Problem List   Diagnosis Date Noted  . Postpartum depression 11/16/2017  . Trichomonal vaginitis during pregnancy in second trimester 05/09/2017  . Smoker 06/30/2015  . Anxiety 06/30/2015  . Urge incontinence 01/13/2014  . History of abnormal cervical Pap smear 01/13/2014  . PTSD (post-traumatic stress disorder) 07/31/2013  . History of chlamydia 05/27/2012    Past Surgical History:  Procedure Laterality Date  . ELECTIVE ABORTION    . MOUTH SURGERY       OB History    Gravida  7   Para  2   Term  2   Preterm      AB  5   Living  2     SAB  2   TAB  3   Ectopic      Multiple  0   Live Births  2            Family History  Problem Relation Age of Onset  . Cancer Maternal Aunt        breast  . Diabetes Maternal Grandmother   . Hypertension Maternal Grandmother   . Hypertension Maternal Grandfather   . Schizophrenia Brother   . Seizures Brother   . Asthma Son   . Fibroids Mother        fibroid tumor in uterus    Social History   Tobacco Use  . Smoking status: Current Every Day Smoker    Packs/day: 0.50    Years: 10.00    Pack years: 5.00    Types: Cigarettes  . Smokeless tobacco: Never Used  Substance Use Topics  . Alcohol use: No    Alcohol/week: 0.0 standard drinks    Comment: occ  . Drug use: No    Comment: Per patient exposed to second hand Marijuana smoke    Home Medications Prior to Admission medications   Medication Sig Start Date End Date Taking? Authorizing Provider  acetaminophen (TYLENOL) 500 MG tablet Take 1,000 mg by  mouth every 8 (eight) hours as needed for mild pain or headache.     [provider]  albuterol (PROVENTIL HFA;VENTOLIN HFA) 108 (90 Base) MCG/ACT inhaler Inhale 2 puffs into the lungs every 6 (six) hours as needed for wheezing or shortness of breath. Patient not taking: Reported on 12/13/2017 02/06/17   Adline Potter, NP  ibuprofen (ADVIL,MOTRIN) 600 MG tablet Take 1 tablet (600 mg total) by mouth every 8 (eight) hours as needed for moderate pain or cramping. 09/26/17   Tamera Stands, DO  medroxyPROGESTERone (DEPO-PROVERA) 150 MG/ML injection Inject 1 mL (150 mg total) into the muscle every 3 (three) months. 11/16/17   Cheral Marker, CNM  omeprazole (PRILOSEC) 20 MG capsule Take 1 capsule (20 mg total) by mouth daily. Patient not taking: Reported on 11/16/2017 04/04/17   Jacklyn Shell, CNM  Prenatal Vit-Fe Fumarate-FA (PREPLUS) 27-1 MG TABS TAKE 1 TABLET BY MOUTH DAILY AT 12 NOON. 06/25/17   Cheral Marker, CNM  senna-docusate (SENOKOT-S) 8.6-50 MG tablet Take 2 tablets by mouth daily. Patient not taking:  Reported on 12/13/2017 09/27/17   Tamera Stands, DO  sertraline (ZOLOFT) 25 MG tablet Take 1 tablet (25 mg total) by mouth daily. Patient not taking: Reported on 12/13/2017 11/16/17   Cheral Marker, CNM    Allergies    Strawberry extract and Latex  Review of Systems   Review of Systems  Musculoskeletal: Positive for arthralgias and joint swelling.  Skin: Negative for wound.  Neurological: Negative for weakness and numbness.    Physical Exam Updated Vital Signs BP 116/69 (BP Location: Right Arm)   Pulse 89   Temp 98.3 F (36.8 C) (Oral)   Resp 19   Ht 5\' 2"  (1.575 m)   Wt 81.6 kg   LMP 03/01/2019   SpO2 100%   BMI 32.92 kg/m   Physical Exam Vitals and nursing note reviewed.  Constitutional:      General: She is not in acute distress.    Appearance: Normal appearance. She is well-developed. She is not ill-appearing.  HENT:     Head: Normocephalic and atraumatic.  Eyes:     General: No scleral icterus.       Right eye: No discharge.        Left eye: No discharge.     Conjunctiva/sclera: Conjunctivae normal.     Pupils: Pupils are equal, round, and reactive to light.  Cardiovascular:     Rate and Rhythm: Normal rate.  Pulmonary:     Effort: Pulmonary effort is normal. No respiratory distress.  Abdominal:     General: There is no distension.  Musculoskeletal:     Cervical back: Normal range of motion.     Comments: Right ankle: Diffuse swelling. Tenderness over lateral ankle. No proximal calf or foot tenderness. 2+ DP pulse. Able to wiggle toes.  Skin:    General: Skin is warm and dry.  Neurological:     Mental Status: She is alert and oriented to person, place, and time.  Psychiatric:        Behavior: Behavior normal.     ED Results / Procedures / Treatments   Labs (all labs ordered are listed, but only abnormal results are displayed) Labs Reviewed - No data to display  EKG None  Radiology DG Ankle 2 Views Right  Result Date:  03/14/2019 CLINICAL DATA:  03/16/2019 last night.  Right ankle and foot pain. EXAM: RIGHT ANKLE - 2 VIEW COMPARISON:  None. FINDINGS: Oblique fracture  of the distal fibula extending from the posterior metadiaphysis to the distal anterior metaphysis. Fracture is non comminuted and without significant displacement. No other fractures.  Ankle joint normally spaced and aligned. Lateral soft tissue swelling. IMPRESSION: 1. Non comminuted oblique fracture of the distal fibula without significant displacement. 2. No other fractures.  Normally aligned ankle joint. Electronically Signed   By: Lajean Manes M.D.   On: 03/14/2019 16:06   DG Foot Complete Right  Result Date: 03/14/2019 CLINICAL DATA:  [place] last night.  Right foot and ankle pain. EXAM: RIGHT FOOT COMPLETE - 3+ VIEW COMPARISON:  None. FINDINGS: No right foot fracture.  No bone lesion. Joints normally spaced and aligned.  No arthropathic changes. Ankle soft tissue swelling.  No soft tissue swelling of the foot. IMPRESSION: No fracture or dislocation. Electronically Signed   By: Lajean Manes M.D.   On: 03/14/2019 16:05    Procedures Procedures (including critical care time)  Medications Ordered in ED Medications - No data to display  ED Course  I have reviewed the triage vital signs and the nursing notes.  Pertinent labs & imaging results that were available during my care of the patient were reviewed by me and considered in my medical decision making (see chart for details).  34 year old female presents with a trip and fall and subsequent R ankle pain and swelling. Her vitals are reassuring. On exam ankle is diffusely swollen and tender of the lateral ankle. She is N/V intact. Xrays show an oblique fracture of the distal fibular without significant displacement. Discussed with Dr. Lyla Glassing with ortho who recommends a posterior and U splint with crutches and to remain non-weight bearing. Advised elevate as much as possible with leg elevated above the  level of the heart. Rx for Ibuprofen and Norco given. Advised call Dr. Noralyn Pick office on Monday for f/u appt next week.  MDM Rules/Calculators/A&P                       Final Clinical Impression(s) / ED Diagnoses Final diagnoses:  Fall, initial encounter  Closed fracture of distal end of right fibula, unspecified fracture morphology, initial encounter    Rx / DC Orders ED Discharge Orders    None       Recardo Evangelist, PA-C 03/14/19 Ardyth Harps, MD 03/15/19 2244

## 2019-03-20 ENCOUNTER — Encounter: Payer: Self-pay | Admitting: Orthopaedic Surgery

## 2019-03-20 ENCOUNTER — Ambulatory Visit: Payer: Self-pay | Admitting: Orthopaedic Surgery

## 2019-07-10 ENCOUNTER — Other Ambulatory Visit: Payer: Self-pay

## 2019-07-10 ENCOUNTER — Ambulatory Visit
Admission: EM | Admit: 2019-07-10 | Discharge: 2019-07-10 | Disposition: A | Discharge status: Home / Self Care | Payer: Self-pay | Attending: Family Medicine | Admitting: Family Medicine

## 2019-07-10 DIAGNOSIS — Z01812 Encounter for preprocedural laboratory examination: Secondary | ICD-10-CM

## 2019-07-10 DIAGNOSIS — Z1152 Encounter for screening for COVID-19: Secondary | ICD-10-CM

## 2019-07-10 DIAGNOSIS — Z20822 Contact with and (suspected) exposure to covid-19: Secondary | ICD-10-CM

## 2019-07-10 NOTE — ED Triage Notes (Signed)
Pt wants covid test, no symptoms  

## 2019-07-10 NOTE — ED Triage Notes (Signed)
See provider note. 

## 2019-07-10 NOTE — Discharge Instructions (Signed)
Your COVID test is pending.  You should self quarantine until the test result is back.    Take Tylenol as needed for fever or discomfort.  Rest and keep yourself hydrated.    Go to the emergency department if you develop shortness of breath, severe diarrhea, high fever not relieved by Tylenol or ibuprofen, or other concerning symptoms.    

## 2019-07-10 NOTE — ED Provider Notes (Signed)
[place]   [id number] 07/10/19 Arrival Time: 6712   CC: COVID symptoms  SUBJECTIVE: History from: patient.  [name] is a 34 y.o. female who presents for request for Covid testing. Denies symptoms or exposure.  Denies fever, chills, fatigue, sinus pain, rhinorrhea, sore throat, SOB, wheezing, chest pain, nausea, changes in bowel or bladder habits.    ROS: As per HPI.  All other pertinent ROS negative.     Past Medical History:  Diagnosis Date  . Abnormal pap 10/2011  . Abnormal Pap smear   . Abortion   . Anxiety   . Anxiety   . Asthma   . Back pain 11/18/2012  . History of abnormal cervical Pap smear 01/13/2014  . History of bronchitis   . History of chlamydia   . History of gonorrhea   . History of trichomoniasis   . Mental disorder    anxiety  . Nausea and vomiting 11/18/2012  . Nausea and vomiting during pregnancy 06/11/2015  . Pregnant 06/11/2015  . Urge incontinence 01/13/2014  . Vaginal Pap smear, abnormal    Past Surgical History:  Procedure Laterality Date  . ELECTIVE ABORTION    . MOUTH SURGERY     Allergies  Allergen Reactions  . Strawberry Extract Hives  . Latex Itching and Rash   No current facility-administered medications on file prior to encounter.   Current Outpatient Medications on File Prior to Encounter  Medication Sig Dispense Refill  . acetaminophen (TYLENOL) 500 MG tablet Take 1,000 mg by mouth every 8 (eight) hours as needed for mild pain or headache.     Marland Kitchen HYDROcodone-acetaminophen (NORCO/VICODIN) 5-325 MG tablet Take 1 tablet by mouth every 4 (four) hours as needed. 20 tablet 0  . ibuprofen (ADVIL) 600 MG tablet Take 1 tablet (600 mg total) by mouth every 6 (six) hours as needed. 30 tablet 0  . ibuprofen (ADVIL,MOTRIN) 600 MG tablet Take 1 tablet (600 mg total) by mouth every 8 (eight) hours as needed for moderate pain or cramping. 30 tablet 0  . medroxyPROGESTERone (DEPO-PROVERA) 150 MG/ML injection Inject 1 mL  (150 mg total) into the muscle every 3 (three) months. 1 mL 3  . Prenatal Vit-Fe Fumarate-FA (PREPLUS) 27-1 MG TABS TAKE 1 TABLET BY MOUTH DAILY AT 12 NOON. 30 tablet 11  . [DISCONTINUED] albuterol (PROVENTIL HFA;VENTOLIN HFA) 108 (90 Base) MCG/ACT inhaler Inhale 2 puffs into the lungs every 6 (six) hours as needed for wheezing or shortness of breath. (Patient not taking: Reported on 12/13/2017) 18 g 1  . [DISCONTINUED] omeprazole (PRILOSEC) 20 MG capsule Take 1 capsule (20 mg total) by mouth daily. (Patient not taking: Reported on 11/16/2017) 30 capsule 6  . [DISCONTINUED] sertraline (ZOLOFT) 25 MG tablet Take 1 tablet (25 mg total) by mouth daily. (Patient not taking: Reported on 12/13/2017) 30 tablet 6   Social History   Socioeconomic History  . Marital status: Single    Spouse name: Not on file  . Number of children: 2  . Years of education: Not on file  . Highest education level: Not on file  Occupational History  . Not on file  Tobacco Use  . Smoking status: Current Every Day Smoker    Packs/day: 0.50    Years: 10.00    Pack years: 5.00    Types: Cigarettes  . Smokeless tobacco: Never Used  Vaping Use  . Vaping Use: Never used  Substance and Sexual Activity  . Alcohol use: No    Alcohol/week: 0.0  standard drinks    Comment: occ  . Drug use: No    Comment: Per patient exposed to second hand Marijuana smoke  . Sexual activity: Yes    Birth control/protection: None, Injection  Other Topics Concern  . Not on file  Social History Narrative  . Not on file   Social Determinants of Health   Financial Resource Strain:   . Difficulty of Paying Living Expenses:   Food Insecurity:   . Worried About Programme researcher, broadcasting/film/video in the Last Year:   . Barista in the Last Year:   Transportation Needs:   . Freight forwarder (Medical):   Marland Kitchen Lack of Transportation (Non-Medical):   Physical Activity:   . Days of Exercise per Week:   . Minutes of Exercise per Session:   Stress:    . Feeling of Stress :   Social Connections:   . Frequency of Communication with Friends and Family:   . Frequency of Social Gatherings with Friends and Family:   . Attends Religious Services:   . Active Member of Clubs or Organizations:   . Attends Banker Meetings:   Marland Kitchen Marital Status:   Intimate Partner Violence:   . Fear of Current or Ex-Partner:   . Emotionally Abused:   Marland Kitchen Physically Abused:   . Sexually Abused:    Family History  Problem Relation Age of Onset  . Cancer Maternal Aunt        breast  . Diabetes Maternal Grandmother   . Hypertension Maternal Grandmother   . Hypertension Maternal Grandfather   . Schizophrenia Brother   . Seizures Brother   . Asthma Son   . Fibroids Mother        fibroid tumor in uterus    OBJECTIVE:  Vitals:   07/10/19 1410  BP: 128/83  Resp: 17  Temp: 99 F (37.2 C)  TempSrc: Tympanic     General appearance: alert; speaking in full sentences and tolerating own secretions HEENT: NCAT; Ears: EACs clear, TMs pearly gray; Eyes: PERRL.  EOM grossly intact. Sinuses: nontender; Nose: nares patent without rhinorrhea, Throat: oropharynx clear, tonsils non erythematous or enlarged, uvula midline  Neck: supple without LAD Lungs: unlabored respirations, symmetrical air entry; cough: absent; no respiratory distress; CTAB Heart: regular rate and rhythm.  Radial pulses 2+ symmetrical bilaterally Skin: warm and dry Psychological: alert and cooperative; normal mood and affect  LABS:  Results for orders placed or performed during the hospital encounter of 07/10/19 (from the past 24 hour(s))  Novel Coronavirus, NAA ([facility])     Status: None   Collection Time: 07/10/19  2:21 PM   Specimen: Nasopharyngeal Swab; Nasopharyngeal(NP) swabs in vial transport medium   Nasopharynge  Result Value Ref Range   SARS-CoV-2, NAA Not Detected Not Detected   Narrative   Performed at:  [address] [place], [place]   [id number] Lab Director: Jolene Schimke MD, Phone:  [phone]  SARS-COV-2, NAA 2 DAY TAT     Status: None   Collection Time: 07/10/19  2:21 PM   Nasopharynge  Result Value Ref Range   SARS-CoV-2, NAA 2 DAY TAT Performed    Narrative   Performed at:  [facility] [employer] [address] [place], [place]  [id number] Lab Director: Jolene Schimke MD, Phone:  [phone]     ASSESSMENT & PLAN:  1. Encounter for screening for COVID-19   2. Encounter for screening laboratory testing for COVID-19 virus in asymptomatic patient  Covid Screen   COVID testing ordered.  It will take between 1-2 days for test results.  Someone will contact you regarding abnormal results.    Patient should remain in quarantine until they have received Covid results.  If negative you may resume normal activities (go back to work/school) while practicing hand hygiene, social distance, and mask wearing.  If positive, patient should remain in quarantine for 10 days from symptom onset AND greater than 72 hours after symptoms resolution (absence of fever without the use of fever-reducing medication and improvement in respiratory symptoms), whichever is longer Get plenty of rest and push fluids Use OTC zyrtec for nasal congestion, runny nose, and/or sore throat Use OTC flonase for nasal congestion and runny nose Use medications daily for symptom relief Use OTC medications like ibuprofen or tylenol as needed fever or pain Call or go to the ED if you have any new or worsening symptoms such as fever, worsening cough, shortness of breath, chest tightness, chest pain, turning blue, changes in mental status.  Reviewed expectations re: course of current medical issues. Questions answered. Outlined signs and symptoms indicating need for more acute intervention. Patient verbalized understanding. After Visit Summary given.         Moshe Cipro, NP 07/11/19 [phone]

## 2019-07-11 LAB — NOVEL CORONAVIRUS, NAA: SARS-CoV-2, NAA: NOT DETECTED

## 2019-07-11 LAB — SARS-COV-2, NAA 2 DAY TAT

## 2019-07-25 ENCOUNTER — Other Ambulatory Visit: Payer: Self-pay

## 2019-07-25 ENCOUNTER — Ambulatory Visit
Admission: EM | Admit: 2019-07-25 | Discharge: 2019-07-25 | Disposition: A | Discharge status: Home / Self Care | Payer: Self-pay

## 2019-07-25 ENCOUNTER — Ambulatory Visit
Admission: EM | Admit: 2019-07-25 | Discharge: 2019-07-25 | Disposition: A | Discharge status: Home / Self Care | Payer: Self-pay | Attending: Emergency Medicine | Admitting: Emergency Medicine

## 2019-07-25 ENCOUNTER — Encounter: Payer: Self-pay | Admitting: Emergency Medicine

## 2019-07-25 DIAGNOSIS — Z1152 Encounter for screening for COVID-19: Secondary | ICD-10-CM

## 2019-07-25 NOTE — ED Provider Notes (Signed)
[facility]   [id number] 07/25/19 Arrival Time: 1939   CC: COVID symptoms  SUBJECTIVE: History from: patient and family.  [name] is a 34 y.o. female who presents for COVID testing.  Denies sick exposure to COVID, flu or strep.  Denies recent travel.  Denies aggravating or alleviating symptoms.  Denies previous COVID infection.   Denies fever, chills, fatigue, nasal congestion, rhinorrhea, sore throat, cough, SOB, wheezing, chest pain, nausea, vomiting, changes in bowel or bladder habits.    ROS: As per HPI.  All other pertinent ROS negative.     Past Medical History:  Diagnosis Date   Abnormal pap 10/2011   Abnormal Pap smear    Abortion    Anxiety    Anxiety    Asthma    Back pain 11/18/2012   History of abnormal cervical Pap smear 01/13/2014   History of bronchitis    History of chlamydia    History of gonorrhea    History of trichomoniasis    Mental disorder    anxiety   Nausea and vomiting 11/18/2012   Nausea and vomiting during pregnancy 06/11/2015   Pregnant 06/11/2015   Urge incontinence 01/13/2014   Vaginal Pap smear, abnormal    Past Surgical History:  Procedure Laterality Date   ELECTIVE ABORTION     MOUTH SURGERY     Allergies  Allergen Reactions   Strawberry Extract Hives   Latex Itching and Rash   No current facility-administered medications on file prior to encounter.   Current Outpatient Medications on File Prior to Encounter  Medication Sig Dispense Refill   acetaminophen (TYLENOL) 500 MG tablet Take 1,000 mg by mouth every 8 (eight) hours as needed for mild pain or headache.      HYDROcodone-acetaminophen (NORCO/VICODIN) 5-325 MG tablet Take 1 tablet by mouth every 4 (four) hours as needed. 20 tablet 0   ibuprofen (ADVIL) 600 MG tablet Take 1 tablet (600 mg total) by mouth every 6 (six) hours as needed. 30 tablet 0   ibuprofen (ADVIL,MOTRIN) 600 MG tablet Take 1 tablet (600 mg total) by mouth every 8  (eight) hours as needed for moderate pain or cramping. 30 tablet 0   medroxyPROGESTERone (DEPO-PROVERA) 150 MG/ML injection Inject 1 mL (150 mg total) into the muscle every 3 (three) months. 1 mL 3   Prenatal Vit-Fe Fumarate-FA (PREPLUS) 27-1 MG TABS TAKE 1 TABLET BY MOUTH DAILY AT 12 NOON. 30 tablet 11   [DISCONTINUED] albuterol (PROVENTIL HFA;VENTOLIN HFA) 108 (90 Base) MCG/ACT inhaler Inhale 2 puffs into the lungs every 6 (six) hours as needed for wheezing or shortness of breath. (Patient not taking: Reported on 12/13/2017) 18 g 1   [DISCONTINUED] omeprazole (PRILOSEC) 20 MG capsule Take 1 capsule (20 mg total) by mouth daily. (Patient not taking: Reported on 11/16/2017) 30 capsule 6   [DISCONTINUED] sertraline (ZOLOFT) 25 MG tablet Take 1 tablet (25 mg total) by mouth daily. (Patient not taking: Reported on 12/13/2017) 30 tablet 6   Social History   Socioeconomic History   Marital status: Single    Spouse name: Not on file   Number of children: 2   Years of education: Not on file   Highest education level: Not on file  Occupational History   Not on file  Tobacco Use   Smoking status: Current Every Day Smoker    Packs/day: 0.50    Years: 10.00    Pack years: 5.00    Types: Cigarettes   Smokeless tobacco: Never Used  Vaping Use   Vaping Use: Never used  Substance and Sexual Activity   Alcohol use: No    Alcohol/week: 0.0 standard drinks    Comment: occ   Drug use: No    Comment: Per patient exposed to second hand Marijuana smoke   Sexual activity: Yes    Birth control/protection: None, Injection  Other Topics Concern   Not on file  Social History Narrative   Not on file   Social Determinants of Health   Financial Resource Strain:    Difficulty of Paying Living Expenses:   Food Insecurity:    Worried About Programme researcher, broadcasting/film/video in the Last Year:    Barista in the Last Year:   Transportation Needs:    Freight forwarder (Medical):     Lack of Transportation (Non-Medical):   Physical Activity:    Days of Exercise per Week:    Minutes of Exercise per Session:   Stress:    Feeling of Stress :   Social Connections:    Frequency of Communication with Friends and Family:    Frequency of Social Gatherings with Friends and Family:    Attends Religious Services:    Active Member of Clubs or Organizations:    Attends Engineer, structural:    Marital Status:   Intimate Partner Violence:    Fear of Current or Ex-Partner:    Emotionally Abused:    Physically Abused:    Sexually Abused:    Family History  Problem Relation Age of Onset   Cancer Maternal Aunt        breast   Diabetes Maternal Grandmother    Hypertension Maternal Grandmother    Hypertension Maternal Grandfather    Schizophrenia Brother    Seizures Brother    Asthma Son    Fibroids Mother        fibroid tumor in uterus    OBJECTIVE:  There were no vitals filed for this visit.   General appearance: alert; appears fatigued, but nontoxic; speaking in full sentences and tolerating own secretions HEENT: NCAT; Ears: EACs clear, TMs pearly gray; Eyes: PERRL.  EOM grossly intact. Sinuses: nontender; Nose: nares patent without rhinorrhea, Throat: oropharynx clear, tonsils non erythematous or enlarged, uvula midline  Neck: supple without LAD Lungs: unlabored respirations, symmetrical air entry; cough: absent; no respiratory distress; CTAB Heart: regular rate and rhythm.  Radial pulses 2+ symmetrical bilaterally Skin: warm and dry Psychological: alert and cooperative; normal mood and affect  LABS:  No results found for this or any previous visit (from the past 24 hour(s)).   ASSESSMENT & PLAN:  1. Encounter for screening for COVID-19     No orders of the defined types were placed in this encounter.   Discharge instructions COVID testing ordered.  It will take between 2-7 days for test results.  Someone will contact you  regarding abnormal results.    In the meantime: You should remain isolated in your home for 10 days from symptom onset AND greater than 24 hours after symptoms resolution (absence of fever without the use of fever-reducing medication and improvement in respiratory symptoms), whichever is longer Get plenty of rest and push fluids Use medications daily for symptom relief Use OTC medications like ibuprofen or tylenol as needed fever or pain Call or go to the ED if you have any new or worsening symptoms such as fever, worsening cough, shortness of breath, chest tightness, chest pain, turning blue, changes in mental status, etc..Marland Kitchen  Reviewed expectations re: course of current medical issues. Questions answered. Outlined signs and symptoms indicating need for more acute intervention. Patient verbalized understanding. After Visit Summary given.      Note: This document was prepared using Dragon voice recognition software and may include unintentional dictation errors.    Durward Parcel, FNP 07/25/19 2014

## 2019-07-25 NOTE — ED Triage Notes (Signed)
Wants covid test, no s/s

## 2019-07-25 NOTE — Discharge Instructions (Signed)

## 2019-07-27 LAB — SARS-COV-2, NAA 2 DAY TAT

## 2019-07-27 LAB — NOVEL CORONAVIRUS, NAA: SARS-CoV-2, NAA: NOT DETECTED

## 2019-08-15 ENCOUNTER — Encounter: Payer: Self-pay | Admitting: Emergency Medicine

## 2019-08-15 ENCOUNTER — Ambulatory Visit
Admission: EM | Admit: 2019-08-15 | Discharge: 2019-08-15 | Disposition: A | Discharge status: Home / Self Care | Payer: Self-pay | Attending: Emergency Medicine | Admitting: Emergency Medicine

## 2019-08-15 DIAGNOSIS — Z20822 Contact with and (suspected) exposure to covid-19: Secondary | ICD-10-CM

## 2019-08-15 NOTE — ED Provider Notes (Addendum)
[facility]   [id number] 08/15/19 Arrival Time: 1749   CC: COVID test  SUBJECTIVE: History from: patient.  ESSICA KIKER is a 34 y.o. female who presents for COVID testing.  Denies sick exposure to COVID, flu or strep.  Denies recent travel.  Denies aggravating or alleviating symptoms.  Denies previous COVID infection.   Denies fever, chills, fatigue, nasal congestion, rhinorrhea, sore throat, cough, SOB, wheezing, chest pain, nausea, vomiting, changes in bowel or bladder habits.     Son is having circumcision   ROS: As per HPI.  All other pertinent ROS negative.     Past Medical History:  Diagnosis Date  . Abnormal pap 10/2011  . Abnormal Pap smear   . Abortion   . Anxiety   . Anxiety   . Asthma   . Back pain 11/18/2012  . History of abnormal cervical Pap smear 01/13/2014  . History of bronchitis   . History of chlamydia   . History of gonorrhea   . History of trichomoniasis   . Mental disorder    anxiety  . Nausea and vomiting 11/18/2012  . Nausea and vomiting during pregnancy 06/11/2015  . Pregnant 06/11/2015  . Urge incontinence 01/13/2014  . Vaginal Pap smear, abnormal    Past Surgical History:  Procedure Laterality Date  . ELECTIVE ABORTION    . MOUTH SURGERY     Allergies  Allergen Reactions  . Strawberry Extract Hives  . Latex Itching and Rash   No current facility-administered medications on file prior to encounter.   Current Outpatient Medications on File Prior to Encounter  Medication Sig Dispense Refill  . acetaminophen (TYLENOL) 500 MG tablet Take 1,000 mg by mouth every 8 (eight) hours as needed for mild pain or headache.     Marland Kitchen HYDROcodone-acetaminophen (NORCO/VICODIN) 5-325 MG tablet Take 1 tablet by mouth every 4 (four) hours as needed. 20 tablet 0  . ibuprofen (ADVIL) 600 MG tablet Take 1 tablet (600 mg total) by mouth every 6 (six) hours as needed. 30 tablet 0  . ibuprofen (ADVIL,MOTRIN) 600 MG tablet Take 1 tablet (600 mg total)  by mouth every 8 (eight) hours as needed for moderate pain or cramping. 30 tablet 0  . medroxyPROGESTERone (DEPO-PROVERA) 150 MG/ML injection Inject 1 mL (150 mg total) into the muscle every 3 (three) months. 1 mL 3  . Prenatal Vit-Fe Fumarate-FA (PREPLUS) 27-1 MG TABS TAKE 1 TABLET BY MOUTH DAILY AT 12 NOON. 30 tablet 11  . [DISCONTINUED] albuterol (PROVENTIL HFA;VENTOLIN HFA) 108 (90 Base) MCG/ACT inhaler Inhale 2 puffs into the lungs every 6 (six) hours as needed for wheezing or shortness of breath. (Patient not taking: Reported on 12/13/2017) 18 g 1  . [DISCONTINUED] omeprazole (PRILOSEC) 20 MG capsule Take 1 capsule (20 mg total) by mouth daily. (Patient not taking: Reported on 11/16/2017) 30 capsule 6  . [DISCONTINUED] sertraline (ZOLOFT) 25 MG tablet Take 1 tablet (25 mg total) by mouth daily. (Patient not taking: Reported on 12/13/2017) 30 tablet 6   Social History   Socioeconomic History  . Marital status: Single    Spouse name: Not on file  . Number of children: 2  . Years of education: Not on file  . Highest education level: Not on file  Occupational History  . Not on file  Tobacco Use  . Smoking status: Current Every Day Smoker    Packs/day: 0.50    Years: 10.00    Pack years: 5.00    Types: Cigarettes  .  Smokeless tobacco: Never Used  Vaping Use  . Vaping Use: Never used  Substance and Sexual Activity  . Alcohol use: No    Alcohol/week: 0.0 standard drinks    Comment: occ  . Drug use: No    Comment: Per patient exposed to second hand Marijuana smoke  . Sexual activity: Yes    Birth control/protection: None, Injection  Other Topics Concern  . Not on file  Social History Narrative  . Not on file   Social Determinants of Health   Financial Resource Strain:   . Difficulty of Paying Living Expenses:   Food Insecurity:   . Worried About Programme researcher, broadcasting/film/video in the Last Year:   . Barista in the Last Year:   Transportation Needs:   . Freight forwarder  (Medical):   Marland Kitchen Lack of Transportation (Non-Medical):   Physical Activity:   . Days of Exercise per Week:   . Minutes of Exercise per Session:   Stress:   . Feeling of Stress :   Social Connections:   . Frequency of Communication with Friends and Family:   . Frequency of Social Gatherings with Friends and Family:   . Attends Religious Services:   . Active Member of Clubs or Organizations:   . Attends Banker Meetings:   Marland Kitchen Marital Status:   Intimate Partner Violence:   . Fear of Current or Ex-Partner:   . Emotionally Abused:   Marland Kitchen Physically Abused:   . Sexually Abused:    Family History  Problem Relation Age of Onset  . Cancer Maternal Aunt        breast  . Diabetes Maternal Grandmother   . Hypertension Maternal Grandmother   . Hypertension Maternal Grandfather   . Schizophrenia Brother   . Seizures Brother   . Asthma Son   . Fibroids Mother        fibroid tumor in uterus    OBJECTIVE:  Vitals:   08/15/19 1808  BP: (!) 160/80  Pulse: 99  Resp: 17  Temp: 99.1 F (37.3 C)  TempSrc: Oral  SpO2: 98%     General appearance: alert; well-appearing, nontoxic; speaking in full sentences and tolerating own secretions HEENT: NCAT; Ears: EACs clear; Eyes: PERRL.  EOM grossly intact. Nose: nares patent without rhinorrhea, Throat: oropharynx clear, tonsils non erythematous or enlarged, uvula midline  Neck: supple without LAD Lungs: unlabored respirations, symmetrical air entry; cough: absent; no respiratory distress; CTAB Heart: regular rate and rhythm.  Skin: warm and dry Psychological: alert and cooperative; normal mood and affect   ASSESSMENT & PLAN:  1. Encounter for laboratory testing for COVID-19 virus    COVID testing ordered.  It will take between 2-5 days for test results.  Someone will contact you regarding abnormal results.    In the meantime:  If you were to develop symptoms: You should remain isolated in your home for 10 days from symptom onset  AND greater than 72 hours after symptoms resolution (absence of fever without the use of fever-reducing medication and improvement in respiratory symptoms), whichever is longer If you have had exposure: you should remain in quarantine for 7 days from exposure.  However, if symptoms develop you must self isolate and return for retesting Get plenty of rest and push fluids Follow up with PCP as needed Call or go to the ED if you have any new symptoms such as fever, cough, shortness of breath, chest tightness, chest pain, turning blue, changes in mental  status, etc...   Reviewed expectations re: course of current medical issues. Questions answered. Outlined signs and symptoms indicating need for more acute intervention. Patient verbalized understanding. After Visit Summary given.    Rennis Harding, PA-C 08/15/19 1827

## 2019-08-15 NOTE — Discharge Instructions (Signed)

## 2019-08-15 NOTE — ED Triage Notes (Signed)
covid test for procedure, no s/s  

## 2019-08-16 LAB — NOVEL CORONAVIRUS, NAA: SARS-CoV-2, NAA: NOT DETECTED

## 2019-08-16 LAB — SARS-COV-2, NAA 2 DAY TAT

## 2020-01-17 ENCOUNTER — Emergency Department (HOSPITAL_COMMUNITY): Payer: Medicaid Other

## 2020-01-17 ENCOUNTER — Encounter (HOSPITAL_COMMUNITY): Payer: Self-pay | Admitting: *Deleted

## 2020-01-17 ENCOUNTER — Emergency Department (HOSPITAL_COMMUNITY)
Admission: EM | Admit: 2020-01-17 | Discharge: 2020-01-17 | Disposition: A | Discharge status: Home / Self Care | Payer: Medicaid Other | Attending: Emergency Medicine | Admitting: Emergency Medicine

## 2020-01-17 DIAGNOSIS — S82832A Other fracture of upper and lower end of left fibula, initial encounter for closed fracture: Secondary | ICD-10-CM | POA: Diagnosis not present

## 2020-01-17 DIAGNOSIS — J45909 Unspecified asthma, uncomplicated: Secondary | ICD-10-CM | POA: Insufficient documentation

## 2020-01-17 DIAGNOSIS — W01198A Fall on same level from slipping, tripping and stumbling with subsequent striking against other object, initial encounter: Secondary | ICD-10-CM | POA: Diagnosis not present

## 2020-01-17 DIAGNOSIS — F17210 Nicotine dependence, cigarettes, uncomplicated: Secondary | ICD-10-CM | POA: Insufficient documentation

## 2020-01-17 DIAGNOSIS — S99912A Unspecified injury of left ankle, initial encounter: Secondary | ICD-10-CM | POA: Diagnosis present

## 2020-01-17 MED ORDER — OXYCODONE-ACETAMINOPHEN 5-325 MG PO TABS
1.0000 | ORAL_TABLET | Freq: Once | ORAL | Status: AC
  Administered 2020-01-17: 1 via ORAL
  Filled 2020-01-17: qty 1

## 2020-01-17 NOTE — ED Triage Notes (Signed)
Fell yesterday, left ankle swollen

## 2020-01-17 NOTE — ED Notes (Signed)
Upon entering pt room pt is very sleepy and hard to arouse.  Pt awaken and states she has 10/10 pain in her ankle.  Pt medicated per provider order.  Pt immediately went back to sleep.

## 2020-01-17 NOTE — ED Notes (Signed)
Pt sleepy upon arrival.  Pt states she is sleepy due to the medication we gave her.  Boot placed on pt.  Pt yelling in pain unassisting this nurse with boot placement.  Pt states she "don't need no one to help her with crutches she just had them".  She said "mam just leave me alone".  But I need you to hand me the phone to call my ride.

## 2020-01-17 NOTE — Discharge Instructions (Addendum)
Seen here for left ankle pain.  Imaging shows you have a distal fibula fracture.  I have placed you in a cam boot.  Please keep on during the day you may take off at nighttime.  Provided with crutches to help with your mobility.  I recommend keeping the leg elevated, applying ice to the area and taking over-the-counter pain medications like ibuprofen or Tylenol every 6 as needed this will help with pain.  It is important that you follow-up with Ortho for further evaluation.  You may go back to your own orthopedic doctor or the one I have provided you.  Come back to the emergency department if you develop chest pain, shortness of breath, severe abdominal pain, uncontrolled nausea, vomiting, diarrhea.

## 2020-01-17 NOTE — ED Provider Notes (Signed)
[facility] EMERGENCY DEPARTMENT Provider Note   CSN: [id number] Arrival date & time: 01/17/20  0901     History Chief Complaint  Patient presents with   Ankle Pain    Kayla RainwaterQuinta L Mccarty is a 34 y.o. female.  HPI   Patient with no significant medical history presents to the emergency department with chief complaint of left ankle pain.  Patient states yesterday she was walking and slipped on a wet floor, she states she landed on her left ankle funny and had severe pain since.  She denies hitting her head, losing conscious, is not on anticoag.  She endorses severe pain on the lateral malleolus, is worse with movement, cannot bear weight on it, denies paresthesias in her toes or foot.  She has no significant surgical history of that ankle, denies alleviating factors, has not taken any medications for.  She denies fevers, chills, shortness breath, chest pain, abdominal pain, nausea, vomiting, diarrhea.   Past Medical History:  Diagnosis Date   Abnormal pap 10/2011   Abnormal Pap smear    Abortion    Anxiety    Anxiety    Asthma    Back pain 11/18/2012   History of abnormal cervical Pap smear 01/13/2014   History of bronchitis    History of chlamydia    History of gonorrhea    History of trichomoniasis    Mental disorder    anxiety   Nausea and vomiting 11/18/2012   Nausea and vomiting during pregnancy 06/11/2015   Pregnant 06/11/2015   Urge incontinence 01/13/2014   Vaginal Pap smear, abnormal     Patient Active Problem List   Diagnosis Date Noted   Postpartum depression 11/16/2017   Trichomonal vaginitis during pregnancy in second trimester 05/09/2017   Smoker 06/30/2015   Anxiety 06/30/2015   Urge incontinence 01/13/2014   History of abnormal cervical Pap smear 01/13/2014   PTSD (post-traumatic stress disorder) 07/31/2013   History of chlamydia 05/27/2012    Past Surgical History:  Procedure Laterality Date   ELECTIVE ABORTION      MOUTH SURGERY       OB History    Gravida  7   Para  2   Term  2   Preterm      AB  5   Living  2     SAB  2   IAB  3   Ectopic      Multiple  0   Live Births  2           Family History  Problem Relation Age of Onset   Cancer Maternal Aunt        breast   Diabetes Maternal Grandmother    Hypertension Maternal Grandmother    Hypertension Maternal Grandfather    Schizophrenia Brother    Seizures Brother    Asthma Son    Fibroids Mother        fibroid tumor in uterus    Social History   Tobacco Use   Smoking status: Current Every Day Smoker    Packs/day: 0.50    Years: 10.00    Pack years: 5.00    Types: Cigarettes   Smokeless tobacco: Never Used  Building services engineerVaping Use   Vaping Use: Never used  Substance Use Topics   Alcohol use: No    Alcohol/week: 0.0 standard drinks    Comment: occ   Drug use: No    Comment: Per patient exposed to second hand Marijuana smoke    Home Medications  Prior to Admission medications   Medication Sig Start Date End Date Taking? Authorizing Provider  acetaminophen (TYLENOL) 500 MG tablet Take 1,000 mg by mouth every 8 (eight) hours as needed for mild pain or headache.     [provider]  HYDROcodone-acetaminophen (NORCO/VICODIN) 5-325 MG tablet Take 1 tablet by mouth every 4 (four) hours as needed. 03/14/19   Bethel Born, PA-C  ibuprofen (ADVIL) 600 MG tablet Take 1 tablet (600 mg total) by mouth every 6 (six) hours as needed. 03/14/19   Bethel Born, PA-C  ibuprofen (ADVIL,MOTRIN) 600 MG tablet Take 1 tablet (600 mg total) by mouth every 8 (eight) hours as needed for moderate pain or cramping. 09/26/17   Tamera Stands, DO  medroxyPROGESTERone (DEPO-PROVERA) 150 MG/ML injection Inject 1 mL (150 mg total) into the muscle every 3 (three) months. 11/16/17   Cheral Marker, CNM  Prenatal Vit-Fe Fumarate-FA (PREPLUS) 27-1 MG TABS TAKE 1 TABLET BY MOUTH DAILY AT 12 NOON. 06/25/17   Cheral Marker, CNM  albuterol (PROVENTIL HFA;VENTOLIN HFA) 108 (90 Base) MCG/ACT inhaler Inhale 2 puffs into the lungs every 6 (six) hours as needed for wheezing or shortness of breath. Patient not taking: Reported on 12/13/2017 02/06/17 03/14/19  Adline Potter, NP  omeprazole (PRILOSEC) 20 MG capsule Take 1 capsule (20 mg total) by mouth daily. Patient not taking: Reported on 11/16/2017 04/04/17 03/14/19  Cresenzo-Dishmon, Scarlette Calico, CNM  sertraline (ZOLOFT) 25 MG tablet Take 1 tablet (25 mg total) by mouth daily. Patient not taking: Reported on 12/13/2017 11/16/17 03/14/19  Cheral Marker, CNM    Allergies    Strawberry extract and Latex  Review of Systems   Review of Systems  Constitutional: Negative for chills and fever.  HENT: Negative for congestion.   Respiratory: Negative for shortness of breath.   Cardiovascular: Negative for chest pain.  Gastrointestinal: Negative for abdominal pain.  Genitourinary: Negative for enuresis.  Musculoskeletal: Negative for back pain.       Left ankle and foot pain  Skin: Negative for rash.  Neurological: Negative for dizziness.  Hematological: Does not bruise/bleed easily.    Physical Exam Updated Vital Signs BP 128/78    Pulse 98    Temp 98.4 F (36.9 C)    Resp 18    Ht 5\' 3"  (1.6 m)    Wt 81.6 kg    SpO2 98%    BMI 31.89 kg/m   Physical Exam Vitals and nursing note reviewed.  Constitutional:      General: She is not in acute distress.    Appearance: She is not ill-appearing.  HENT:     Head: Normocephalic and atraumatic.     Nose: No congestion.  Eyes:     Conjunctiva/sclera: Conjunctivae normal.  Cardiovascular:     Rate and Rhythm: Normal rate.  Pulmonary:     Effort: Pulmonary effort is normal.  Musculoskeletal:        General: Swelling and tenderness present.     Left lower leg: Edema present.     Comments: Patient's left ankle was visualized it was edematous, not erythematous, not warm to the touch.  She had decreased range of  motion with flexion and extension most likely secondary to pain, she had full range of motion of her toes and knee.  It was tender to palpation along the proximal ends of her third, fourth, fifth metatarsals, tender along her lateral malleolus.  There is no deformities or crepitus noted.  Neurovascular fully  intact.  Skin:    General: Skin is warm and dry.  Neurological:     Mental Status: She is alert.  Psychiatric:        Mood and Affect: Mood normal.     ED Results / Procedures / Treatments   Labs (all labs ordered are listed, but only abnormal results are displayed) Labs Reviewed - No data to display  EKG None  Radiology DG Ankle Complete Left  Result Date: 01/17/2020 CLINICAL DATA:  Twisting injury with ankle pain and swelling, initial encounter EXAM: LEFT ANKLE COMPLETE - 3+ VIEW COMPARISON:  None. FINDINGS: Oblique fracture is noted through the distal fibula with associated lateral soft tissue swelling. Distal tibia is within normal limits. No other focal abnormality is seen. IMPRESSION: Mildly displaced distal fibular fracture with associated soft tissue swelling. Electronically Signed   By: Alcide Clever M.D.   On: 01/17/2020 10:19   DG Foot Complete Left  Result Date: 01/17/2020 CLINICAL DATA:  Recent twisting injury with lateral ankle pain, initial encounter EXAM: LEFT FOOT - COMPLETE 3+ VIEW COMPARISON:  None. FINDINGS: Mildly displaced distal fibular fracture is again identified. No other fracture or dislocation is seen. Lateral soft tissue swelling is noted. IMPRESSION: Distal fibular fracture.  No other focal abnormality is noted. Electronically Signed   By: Alcide Clever M.D.   On: 01/17/2020 10:27    Procedures Procedures (including critical care time)  Medications Ordered in ED Medications  oxyCODONE-acetaminophen (PERCOCET/ROXICET) 5-325 MG per tablet 1 tablet (1 tablet Oral Given 01/17/20 1047)    ED Course  I have reviewed the triage vital signs and the  nursing notes.  Pertinent labs & imaging results that were available during my care of the patient were reviewed by me and considered in my medical decision making (see chart for details).    MDM Rules/Calculators/A&P                          Patient presents with left ankle pain.  She is alert, does not appear in acute distress, vital signs reassuring.  Will obtain imaging of her ankle and foot, provide her with pain medications and reevaluate.  X-ray of left ankle shows mildly displaced distal fibular fracture with associated soft tissue injury.  X-ray foot was unremarkable.     low suspicion for ligament or tendon damage as area was palpated no gross defects noted, patient had limited range of motion with flexion extension at the ankle full range of motion at toes and knee.  I suspect limited range of motion at ankle is secondary due to pain as she has noted distal fibula fracture.  Low suspicion for Achilles tendon rupture as Achilles tendon was fully intact, no noted fracture noted at the calcaneus.  low suspicion for compartment syndrome as area was palpated it was soft to the touch, neurovascular fully intact.  I suspect patient's pain is secondary due to her fracture noted on x-ray, will place her in a cam boot, provided her with crutches, and have her follow-up with Ortho for further evaluation.  Vital signs have remained stable, no indication for hospital admission. Patient given at home care as well strict return precautions.  Patient verbalized that they understood agreed to said plan.    Final Clinical Impression(s) / ED Diagnoses Final diagnoses:  Other closed fracture of distal end of left fibula, initial encounter    Rx / DC Orders ED Discharge Orders    None  Carroll Sage, PA-C 01/17/20 1140    Pricilla Loveless, MD 01/17/20 [phone]

## 2020-01-22 ENCOUNTER — Encounter: Payer: Self-pay | Admitting: Orthopedic Surgery

## 2020-01-22 ENCOUNTER — Other Ambulatory Visit: Payer: Self-pay

## 2020-01-22 ENCOUNTER — Ambulatory Visit (INDEPENDENT_AMBULATORY_CARE_PROVIDER_SITE_OTHER): Payer: Medicaid Other | Admitting: Orthopedic Surgery

## 2020-01-22 VITALS — BP 133/82 | HR 102 | Ht 63.0 in | Wt 180.0 lb

## 2020-01-22 DIAGNOSIS — S82832A Other fracture of upper and lower end of left fibula, initial encounter for closed fracture: Secondary | ICD-10-CM | POA: Diagnosis not present

## 2020-01-22 MED ORDER — HYDROCODONE-ACETAMINOPHEN 5-325 MG PO TABS: 1.0000 | ORAL_TABLET | Freq: Four times a day (QID) | ORAL | 0 refills | Status: DC | PRN

## 2020-01-22 NOTE — Patient Instructions (Signed)
Wear cam walker and use crutches   Out of work 6-8 weeks

## 2020-01-22 NOTE — Progress Notes (Addendum)
EMERGENCY ROOM FOLLOW UP  NEW PROBLEM/PATIENT   Patient ID: [name], female   DOB: [birth year], 34 y.o.   MRN: [id number]   ASSESSMENT AND PLAN:  Weber B type distal fibular fracture right at the level of the plafond the ankle mortise looks to be intact the medial clear space and lateral clear space are equal but will need close follow-up patient will have to be checked and weightbearing Patient will need to come back for x-ray in 2 weeks.  If no change in the fracture patient can continue with conservative cast treatment.  I would delay full weightbearing for the first 4 weeks.  Emergency room record from (date) 01/17/20 has been reviewed and this is included by reference and includes the review of systems with the following addition:   Chief Complaint  Patient presents with  . Ankle Pain    Left ankle injury 01/16/20    HPI [name] is a 34 y.o. female.  Evaluation of left ankle injury  34 year old female slipped injured her left ankle and foot came to the emergency room for evaluation and treatment on 25 December.  X-ray showed a lateral malleolus fracture with intact ankle mortise she was placed in a cam walker given crutches and sent for orthopedic follow-up  Comes in today complaining of pain and swelling.  She says she was told the ankle was sprained     Review of Systems Review of Systems  Constitutional: Positive for activity change.  HENT: Negative.   Respiratory: Negative.      has a past medical history of Abnormal pap (10/2011), Abnormal Pap smear, Abortion, Anxiety, Anxiety, Asthma, Back pain (11/18/2012), History of abnormal cervical Pap smear (01/13/2014), History of bronchitis, History of chlamydia, History of gonorrhea, History of trichomoniasis, Mental disorder, Nausea and vomiting (11/18/2012), Nausea and vomiting during pregnancy (06/11/2015), Pregnant (06/11/2015), Urge incontinence (01/13/2014), and Vaginal Pap smear, abnormal.   Past  Surgical History:  Procedure Laterality Date  . ELECTIVE ABORTION    . MOUTH SURGERY      Family History  Problem Relation Age of Onset  . Cancer Maternal Aunt        breast  . Diabetes Maternal Grandmother   . Hypertension Maternal Grandmother   . Hypertension Maternal Grandfather   . Schizophrenia Brother   . Seizures Brother   . Asthma Son   . Fibroids Mother        fibroid tumor in uterus    Social History Social History   Tobacco Use  . Smoking status: Current Every Day Smoker    Packs/day: 0.50    Years: 10.00    Pack years: 5.00    Types: Cigarettes  . Smokeless tobacco: Never Used  Vaping Use  . Vaping Use: Never used  Substance Use Topics  . Alcohol use: No    Alcohol/week: 0.0 standard drinks    Comment: occ  . Drug use: No    Comment: Per patient exposed to second hand Marijuana smoke    Allergies  Allergen Reactions  . Strawberry Extract Hives  . Latex Itching and Rash    Current Outpatient Medications  Medication Sig Dispense Refill  . acetaminophen (TYLENOL) 500 MG tablet Take 1,000 mg by mouth every 8 (eight) hours as needed for mild pain or headache.     Marland Kitchen HYDROcodone-acetaminophen (NORCO/VICODIN) 5-325 MG tablet Take 1 tablet by mouth every 4 (four) hours as needed. 20 tablet 0  . ibuprofen (ADVIL) 600 MG tablet Take 1 tablet (  600 mg total) by mouth every 6 (six) hours as needed. 30 tablet 0  . ibuprofen (ADVIL,MOTRIN) 600 MG tablet Take 1 tablet (600 mg total) by mouth every 8 (eight) hours as needed for moderate pain or cramping. 30 tablet 0  . medroxyPROGESTERone (DEPO-PROVERA) 150 MG/ML injection Inject 1 mL (150 mg total) into the muscle every 3 (three) months. 1 mL 3  . Prenatal Vit-Fe Fumarate-FA (PREPLUS) 27-1 MG TABS TAKE 1 TABLET BY MOUTH DAILY AT 12 NOON. 30 tablet 11   No current facility-administered medications for this visit.    Physical Exam BP 133/82   Pulse (!) 102   Ht 5\' 3"  (1.6 m)   Wt 180 lb (81.6 kg)   BMI 31.89  kg/m  Body mass index is 31.89 kg/m.  Well developed and well nourished  Stands with normal weight bearing line contributory comes in with a Cam walker no crutches Alert and oriented x 3  Normal affect and mood   GAIT:  Abnormal (STRIMVS) Skin is bruised and ecchymotic no blisters no open wounds tenderness lateral malleolus and ankle joint range of motion is diminished was put in plantarflexion but can bring to neutral position.  Instability test deferred weak dorsiflexion eversion  Vascular exam normal sensation intact  Data Reviewed IMAGING From THE ER AND THE REPORT ARE REVIEWED, MY INTERPRETATION OF THE IMAGE(S) IS : Left ankle shows a slight lateral displacement of the distal fibula with intact ankle mortise the fracture is at the level of the tibial plafond   Assessment  Encounter Diagnosis  Name Primary?  . Other closed fracture of distal end of left fibula, initial encounter Yes   Meds ordered this encounter  Medications  . HYDROcodone-acetaminophen (NORCO/VICODIN) 5-325 MG tablet    Sig: Take 1 tablet by mouth every 6 (six) hours as needed for moderate pain.    Dispense:  30 tablet    Refill:  0     Plan  Treat as office visits for now in case surgery needed Crutches protected weightbearing x-ray in 2 weeks patient aware may need surgery if fracture moves out of place  No work 6 to 8 weeks   , MD 01/22/2020 11:47 AM

## 2020-01-28 ENCOUNTER — Emergency Department (HOSPITAL_COMMUNITY)
Admission: EM | Admit: 2020-01-28 | Discharge: 2020-01-28 | Disposition: A | Discharge status: Home / Self Care | Payer: Medicaid Other | Attending: Emergency Medicine | Admitting: Emergency Medicine

## 2020-01-28 ENCOUNTER — Other Ambulatory Visit: Payer: Self-pay

## 2020-01-28 ENCOUNTER — Encounter (HOSPITAL_COMMUNITY): Payer: Self-pay | Admitting: Emergency Medicine

## 2020-01-28 DIAGNOSIS — F17210 Nicotine dependence, cigarettes, uncomplicated: Secondary | ICD-10-CM | POA: Insufficient documentation

## 2020-01-28 DIAGNOSIS — J45909 Unspecified asthma, uncomplicated: Secondary | ICD-10-CM | POA: Insufficient documentation

## 2020-01-28 DIAGNOSIS — R059 Cough, unspecified: Secondary | ICD-10-CM | POA: Diagnosis present

## 2020-01-28 DIAGNOSIS — U071 COVID-19: Secondary | ICD-10-CM | POA: Insufficient documentation

## 2020-01-28 DIAGNOSIS — Z91040 Latex allergy status: Secondary | ICD-10-CM | POA: Insufficient documentation

## 2020-01-28 LAB — RESP PANEL BY RT-PCR (FLU A&B, COVID) ARPGX2
Influenza A by PCR: NEGATIVE
Influenza B by PCR: NEGATIVE
SARS Coronavirus 2 by RT PCR: POSITIVE — AB

## 2020-01-28 NOTE — ED Notes (Signed)
Pt with productive cough with emesis x 2 days

## 2020-01-28 NOTE — ED Triage Notes (Signed)
Pt c/o of a cough and fever x 2 days.

## 2020-01-28 NOTE — ED Notes (Signed)
Date and time results received: 01/28/20 1259 (use smartphrase ".now" to insert current time)  Test: Covid Critical Value: positive  Name of Provider Notified: K. Le Grand, [place]  Orders Received? Or Actions Taken?: na

## 2020-01-28 NOTE — ED Notes (Signed)
EDP discharged pt from room and has reviewed discharge papers with pt.

## 2020-01-29 NOTE — ED Provider Notes (Signed)
[facility] EMERGENCY DEPARTMENT Provider Note   CSN: [id number] Arrival date & time: 01/28/20  1120     History Chief Complaint  Patient presents with  . Cough    [name] is a 35 y.o. female.  The history is provided by the patient. No language interpreter was used.  Cough Cough characteristics:  Non-productive Sputum characteristics:  Nondescript Severity:  Mild Duration:  2 days Timing:  Constant Relieved by:  Nothing Worsened by:  Nothing Ineffective treatments:  None tried Associated symptoms: no fever        Past Medical History:  Diagnosis Date  . Abnormal pap 10/2011  . Abnormal Pap smear   . Abortion   . Anxiety   . Anxiety   . Asthma   . Back pain 11/18/2012  . History of abnormal cervical Pap smear 01/13/2014  . History of bronchitis   . History of chlamydia   . History of gonorrhea   . History of trichomoniasis   . Mental disorder    anxiety  . Nausea and vomiting 11/18/2012  . Nausea and vomiting during pregnancy 06/11/2015  . Pregnant 06/11/2015  . Urge incontinence 01/13/2014  . Vaginal Pap smear, abnormal     Patient Active Problem List   Diagnosis Date Noted  . Postpartum depression 11/16/2017  . Trichomonal vaginitis during pregnancy in second trimester 05/09/2017  . Smoker 06/30/2015  . Anxiety 06/30/2015  . Urge incontinence 01/13/2014  . History of abnormal cervical Pap smear 01/13/2014  . PTSD (post-traumatic stress disorder) 07/31/2013  . History of chlamydia 05/27/2012    Past Surgical History:  Procedure Laterality Date  . ELECTIVE ABORTION    . MOUTH SURGERY       OB History    Gravida  7   Para  2   Term  2   Preterm      AB  5   Living  2     SAB  2   IAB  3   Ectopic      Multiple  0   Live Births  2           Family History  Problem Relation Age of Onset  . Cancer Maternal Aunt        breast  . Diabetes Maternal Grandmother   . Hypertension Maternal Grandmother   .  Hypertension Maternal Grandfather   . Schizophrenia Brother   . Seizures Brother   . Asthma Son   . Fibroids Mother        fibroid tumor in uterus    Social History   Tobacco Use  . Smoking status: Current Every Day Smoker    Packs/day: 0.50    Years: 10.00    Pack years: 5.00    Types: Cigarettes  . Smokeless tobacco: Never Used  Vaping Use  . Vaping Use: Never used  Substance Use Topics  . Alcohol use: No    Alcohol/week: 0.0 standard drinks    Comment: occ  . Drug use: No    Comment: Per patient exposed to second hand Marijuana smoke    Home Medications Prior to Admission medications   Medication Sig Start Date End Date Taking? Authorizing Provider  acetaminophen (TYLENOL) 500 MG tablet Take 1,000 mg by mouth every 8 (eight) hours as needed for mild pain or headache.     [provider]  HYDROcodone-acetaminophen (NORCO/VICODIN) 5-325 MG tablet Take 1 tablet by mouth every 6 (six) hours as needed for moderate pain. 01/22/20  Carole Civil, MD  ibuprofen (ADVIL) 600 MG tablet Take 1 tablet (600 mg total) by mouth every 6 (six) hours as needed. 03/14/19   Recardo Evangelist, PA-C  ibuprofen (ADVIL,MOTRIN) 600 MG tablet Take 1 tablet (600 mg total) by mouth every 8 (eight) hours as needed for moderate pain or cramping. 09/26/17   Glenice Bow, DO  medroxyPROGESTERone (DEPO-PROVERA) 150 MG/ML injection Inject 1 mL (150 mg total) into the muscle every 3 (three) months. 11/16/17   Roma Schanz, CNM  Prenatal Vit-Fe Fumarate-FA (PREPLUS) 27-1 MG TABS TAKE 1 TABLET BY MOUTH DAILY AT 12 NOON. 06/25/17   Roma Schanz, CNM  albuterol (PROVENTIL HFA;VENTOLIN HFA) 108 (90 Base) MCG/ACT inhaler Inhale 2 puffs into the lungs every 6 (six) hours as needed for wheezing or shortness of breath. Patient not taking: Reported on 12/13/2017 02/06/17 03/14/19  Estill Dooms, NP  omeprazole (PRILOSEC) 20 MG capsule Take 1 capsule (20 mg total) by mouth daily. Patient  not taking: Reported on 11/16/2017 04/04/17 03/14/19  Cresenzo-Dishmon, Joaquim Lai, CNM  sertraline (ZOLOFT) 25 MG tablet Take 1 tablet (25 mg total) by mouth daily. Patient not taking: Reported on 12/13/2017 11/16/17 03/14/19  Roma Schanz, CNM    Allergies    Strawberry extract and Latex  Review of Systems   Review of Systems  Constitutional: Negative for fever.  Respiratory: Positive for cough.   All other systems reviewed and are negative.   Physical Exam Updated Vital Signs BP (!) 113/99 (BP Location: Right Arm)   Pulse 88   Temp 98.3 F (36.8 C) (Oral)   Resp 18   Ht 5\' 3"  (1.6 m)   Wt 77.1 kg   LMP 01/15/2020 (Approximate)   SpO2 100%   BMI 30.11 kg/m   Physical Exam Vitals and nursing note reviewed.  Constitutional:      Appearance: She is well-developed and well-nourished.  HENT:     Head: Normocephalic.  Eyes:     Extraocular Movements: EOM normal.  Cardiovascular:     Rate and Rhythm: Normal rate.  Pulmonary:     Effort: Pulmonary effort is normal.  Abdominal:     General: There is no distension.  Musculoskeletal:        General: Normal range of motion.     Cervical back: Normal range of motion.  Neurological:     Mental Status: She is alert and oriented to person, place, and time.  Psychiatric:        Mood and Affect: Mood and affect and mood normal.     ED Results / Procedures / Treatments   Labs (all labs ordered are listed, but only abnormal results are displayed) Labs Reviewed  RESP PANEL BY RT-PCR (FLU A&B, COVID) ARPGX2 - Abnormal; Notable for the following components:      Result Value   SARS Coronavirus 2 by RT PCR POSITIVE (*)    All other components within normal limits    EKG None  Radiology No results found.  Procedures Procedures (including critical care time)  Medications Ordered in ED Medications - No data to display  ED Course  I have reviewed the triage vital signs and the nursing notes.  Pertinent labs &  imaging results that were available during my care of the patient were reviewed by me and considered in my medical decision making (see chart for details).    MDM Rules/Calculators/A&P  MDM: covid is positive  Final Clinical Impression(s) / ED Diagnoses Final diagnoses:  COVID    Rx / DC Orders ED Discharge Orders    None    An After Visit Summary was printed and given to the patient.    Elson Areas, [place] 01/29/20 5009    Bethann Berkshire, MD 01/29/20 1032

## 2020-02-04 ENCOUNTER — Other Ambulatory Visit: Payer: Self-pay

## 2020-02-04 DIAGNOSIS — S82832A Other fracture of upper and lower end of left fibula, initial encounter for closed fracture: Secondary | ICD-10-CM

## 2020-02-04 MED ORDER — HYDROCODONE-ACETAMINOPHEN 5-325 MG PO TABS: 1.0000 | ORAL_TABLET | Freq: Four times a day (QID) | ORAL | 0 refills | Status: AC | PRN

## 2020-02-05 ENCOUNTER — Ambulatory Visit: Payer: Medicaid Other | Admitting: Orthopedic Surgery

## 2020-02-16 ENCOUNTER — Ambulatory Visit: Payer: Medicaid Other | Admitting: Orthopedic Surgery

## 2020-02-23 ENCOUNTER — Ambulatory Visit: Payer: Medicaid Other | Admitting: Orthopedic Surgery

## 2020-02-23 DIAGNOSIS — S82832D Other fracture of upper and lower end of left fibula, subsequent encounter for closed fracture with routine healing: Secondary | ICD-10-CM

## 2020-02-26 ENCOUNTER — Encounter: Payer: Self-pay | Admitting: Orthopedic Surgery

## 2020-02-26 ENCOUNTER — Ambulatory Visit (INDEPENDENT_AMBULATORY_CARE_PROVIDER_SITE_OTHER): Payer: Medicaid Other | Admitting: Orthopedic Surgery

## 2020-02-26 ENCOUNTER — Ambulatory Visit: Payer: Medicaid Other

## 2020-02-26 ENCOUNTER — Other Ambulatory Visit: Payer: Self-pay

## 2020-02-26 DIAGNOSIS — S82832D Other fracture of upper and lower end of left fibula, subsequent encounter for closed fracture with routine healing: Secondary | ICD-10-CM

## 2020-02-26 MED ORDER — HYDROCODONE-ACETAMINOPHEN 5-325 MG PO TABS: 1.0000 | ORAL_TABLET | Freq: Three times a day (TID) | ORAL | 0 refills | Status: AC | PRN

## 2020-02-26 NOTE — Progress Notes (Signed)
Chief Complaint  Patient presents with  . Ankle Injury    Left 01/16/20 Ankle fracture      Treated with a cam walker  Patient complains of continued pain  Patient currently on ibuprofen    HPI [name] is a 35 y.o. female.  Evaluation of left ankle injury   35 year old female slipped injured her left ankle and foot came to the emergency room for evaluation and treatment on 25 December.  X-ray showed a lateral malleolus fracture with intact ankle mortise she was placed in a cam walker given crutches and sent for orthopedic follow-up   Comes in today complaining of pain and swelling.  She says she was told the ankle was sprained   LMP 02/12/2020 (Approximate)   X-ray shows the fracture is healing the mortise is intact  Recommend she weight-bear as tolerated come back in 4 weeks for x-ray  Meds ordered this encounter  Medications  . HYDROcodone-acetaminophen (NORCO/VICODIN) 5-325 MG tablet    Sig: Take 1 tablet by mouth every 8 (eight) hours as needed for up to 5 days for moderate pain.    Dispense:  30 tablet    Refill:  0    Continue ibuprofen as well

## 2020-03-22 DIAGNOSIS — S82832D Other fracture of upper and lower end of left fibula, subsequent encounter for closed fracture with routine healing: Secondary | ICD-10-CM | POA: Insufficient documentation

## 2020-03-25 ENCOUNTER — Ambulatory Visit: Payer: Medicaid Other | Admitting: Orthopedic Surgery

## 2020-03-25 ENCOUNTER — Encounter: Payer: Self-pay | Admitting: Orthopedic Surgery

## 2020-03-25 DIAGNOSIS — S82832D Other fracture of upper and lower end of left fibula, subsequent encounter for closed fracture with routine healing: Secondary | ICD-10-CM

## 2020-06-07 ENCOUNTER — Ambulatory Visit: Payer: Medicaid Other | Admitting: Orthopedic Surgery

## 2020-08-26 ENCOUNTER — Ambulatory Visit: Payer: Medicaid Other | Admitting: Orthopedic Surgery

## 2020-08-26 ENCOUNTER — Encounter: Payer: Self-pay | Admitting: Orthopedic Surgery

## 2020-10-11 ENCOUNTER — Emergency Department (HOSPITAL_COMMUNITY)
Admission: EM | Admit: 2020-10-11 | Discharge: 2020-10-12 | Disposition: A | Discharge status: Home / Self Care | Payer: Medicaid Other | Attending: Emergency Medicine | Admitting: Emergency Medicine

## 2020-10-11 ENCOUNTER — Other Ambulatory Visit: Payer: Self-pay

## 2020-10-11 DIAGNOSIS — R531 Weakness: Secondary | ICD-10-CM

## 2020-10-11 DIAGNOSIS — U071 COVID-19: Secondary | ICD-10-CM | POA: Diagnosis not present

## 2020-10-11 DIAGNOSIS — J45909 Unspecified asthma, uncomplicated: Secondary | ICD-10-CM | POA: Insufficient documentation

## 2020-10-11 DIAGNOSIS — F17210 Nicotine dependence, cigarettes, uncomplicated: Secondary | ICD-10-CM | POA: Diagnosis not present

## 2020-10-11 DIAGNOSIS — H1131 Conjunctival hemorrhage, right eye: Secondary | ICD-10-CM | POA: Insufficient documentation

## 2020-10-11 DIAGNOSIS — R519 Headache, unspecified: Secondary | ICD-10-CM | POA: Diagnosis not present

## 2020-10-11 DIAGNOSIS — R509 Fever, unspecified: Secondary | ICD-10-CM | POA: Diagnosis present

## 2020-10-11 DIAGNOSIS — R5383 Other fatigue: Secondary | ICD-10-CM

## 2020-10-11 MED ORDER — TETRACAINE HCL 0.5 % OP SOLN
2.0000 [drp] | Freq: Once | OPHTHALMIC | Status: AC
  Administered 2020-10-12: 2 [drp] via OPHTHALMIC
  Filled 2020-10-11: qty 4

## 2020-10-11 MED ORDER — FLUORESCEIN SODIUM 1 MG OP STRP
1.0000 | ORAL_STRIP | Freq: Once | OPHTHALMIC | Status: AC
  Administered 2020-10-12: 1 via OPHTHALMIC
  Filled 2020-10-11: qty 1

## 2020-10-11 NOTE — ED Triage Notes (Signed)
Pt c/o lost of taste and smell. Pt also c/o headache. Pt also states she lost her balance and fell Friday injuring right eye.

## 2020-10-12 ENCOUNTER — Emergency Department (HOSPITAL_COMMUNITY): Payer: Medicaid Other

## 2020-10-12 LAB — COMPREHENSIVE METABOLIC PANEL
ALT: 42 U/L (ref 0–44)
AST: 52 U/L — ABNORMAL HIGH (ref 15–41)
Albumin: 3.7 g/dL (ref 3.5–5.0)
Alkaline Phosphatase: 62 U/L (ref 38–126)
Anion gap: <3 — ABNORMAL LOW (ref 5–15)
BUN: 7 mg/dL (ref 6–20)
CO2: 28 mmol/L (ref 22–32)
Calcium: 8.1 mg/dL — ABNORMAL LOW (ref 8.9–10.3)
Chloride: 107 mmol/L (ref 98–111)
Creatinine, Ser: 0.54 mg/dL (ref 0.44–1.00)
GFR, Estimated: >60 mL/min (ref >60)
Glucose, Bld: 95 mg/dL (ref 70–99)
Potassium: 3.5 mmol/L (ref 3.5–5.1)
Sodium: 137 mmol/L (ref 135–145)
Total Bilirubin: 0.4 mg/dL (ref 0.3–1.2)
Total Protein: 7.4 g/dL (ref 6.5–8.1)

## 2020-10-12 LAB — URINALYSIS, ROUTINE W REFLEX MICROSCOPIC
Bilirubin Urine: NEGATIVE
Glucose, UA: NEGATIVE mg/dL
Hgb urine dipstick: NEGATIVE
Ketones, ur: NEGATIVE mg/dL
Leukocytes,Ua: NEGATIVE
Nitrite: NEGATIVE
Protein, ur: NEGATIVE mg/dL
Specific Gravity, Urine: <1.005 — ABNORMAL LOW (ref 1.005–1.030)
pH: 6 (ref 5.0–8.0)

## 2020-10-12 LAB — CBC WITH DIFFERENTIAL/PLATELET
Abs Immature Granulocytes: 0.02 10*3/uL (ref 0.00–0.07)
Basophils Absolute: 0 10*3/uL (ref 0.0–0.1)
Basophils Relative: 1 %
Eosinophils Absolute: 0.5 10*3/uL (ref 0.0–0.5)
Eosinophils Relative: 9 %
HCT: 36.3 % (ref 36.0–46.0)
Hemoglobin: 11.4 g/dL — ABNORMAL LOW (ref 12.0–15.0)
Immature Granulocytes: 0 %
Lymphocytes Relative: 39 %
Lymphs Abs: 2.4 10*3/uL (ref 0.7–4.0)
MCH: 29.1 pg (ref 26.0–34.0)
MCHC: 31.4 g/dL (ref 30.0–36.0)
MCV: 92.6 fL (ref 80.0–100.0)
Monocytes Absolute: 0.7 10*3/uL (ref 0.1–1.0)
Monocytes Relative: 12 %
Neutro Abs: 2.4 10*3/uL (ref 1.7–7.7)
Neutrophils Relative %: 39 %
Platelets: 200 10*3/uL (ref 150–400)
RBC: 3.92 MIL/uL (ref 3.87–5.11)
RDW: 14.4 % (ref 11.5–15.5)
WBC: 6.1 10*3/uL (ref 4.0–10.5)
nRBC: 0 % (ref 0.0–0.2)

## 2020-10-12 LAB — RESP PANEL BY RT-PCR (FLU A&B, COVID) ARPGX2
Influenza A by PCR: NEGATIVE
Influenza B by PCR: NEGATIVE
SARS Coronavirus 2 by RT PCR: NEGATIVE

## 2020-10-12 LAB — I-STAT BETA HCG BLOOD, ED (MC, WL, AP ONLY): I-stat hCG, quantitative: <5 m[IU]/mL (ref <5)

## 2020-10-12 MED ORDER — SODIUM CHLORIDE 0.9 % IV BOLUS: 1000.0000 mL | Freq: Once | INTRAVENOUS | Status: DC

## 2020-10-12 NOTE — ED Provider Notes (Signed)
[facility] EMERGENCY DEPARTMENT Provider Note   CSN: [id number] Arrival date & time: 10/11/20  2305     History Chief Complaint  Patient presents with   Fever    ZENIA GUEST is a 35 y.o. female.  Patient with a history of anxiety and asthma here with multiple complaints.  States she is felt generally weak for the past 1 week and exposed to her aunt who has COVID.  Had issues with nausea and vomiting and diarrhea earlier in the week but those have since resolved.  Feels weak all over and had an episode of falling because of generalized weakness.  She struck her face and her right eye 3 days ago when she fell.  No preceding room spinning dizziness but "lost balance". Has redness to her right eye since.  Denies any blurry vision or double vision.  Does have a history of recurrent headaches which generally takes Goody powder for her.  Reports loss of taste and smell but no cough.  Has had runny nose and sore throat.  Some difficulty breathing with coughing.  No chest pain.  No abdominal pain.  No nausea and vomiting for least the past 3 days. Does not think she has had a fever but has not checked her temperature at home.  No pain with urination or blood in the urine.  The history is provided by the patient.  Fever Associated symptoms: congestion, diarrhea, headaches, myalgias, nausea, sore throat and vomiting   Associated symptoms: no cough and no dysuria       Past Medical History:  Diagnosis Date   Abnormal pap 10/2011   Abnormal Pap smear    Abortion    Anxiety    Anxiety    Asthma    Back pain 11/18/2012   History of abnormal cervical Pap smear 01/13/2014   History of bronchitis    History of chlamydia    History of gonorrhea    History of trichomoniasis    Mental disorder    anxiety   Nausea and vomiting 11/18/2012   Nausea and vomiting during pregnancy 06/11/2015   Pregnant 06/11/2015   Urge incontinence 01/13/2014   Vaginal Pap smear, abnormal     Patient  Active Problem List   Diagnosis Date Noted   Closed fracture of distal end of left fibula with routine healing 03/22/2020   Postpartum depression 11/16/2017   Trichomonal vaginitis during pregnancy in second trimester 05/09/2017   Smoker 06/30/2015   Anxiety 06/30/2015   Urge incontinence 01/13/2014   History of abnormal cervical Pap smear 01/13/2014   PTSD (post-traumatic stress disorder) 07/31/2013   History of chlamydia 05/27/2012    Past Surgical History:  Procedure Laterality Date   ELECTIVE ABORTION     MOUTH SURGERY       OB History     Gravida  7   Para  2   Term  2   Preterm      AB  5   Living  2      SAB  2   IAB  3   Ectopic      Multiple  0   Live Births  2           Family History  Problem Relation Age of Onset   Cancer Maternal Aunt        breast   Diabetes Maternal Grandmother    Hypertension Maternal Grandmother    Hypertension Maternal Grandfather    Schizophrenia Brother  Seizures Brother    Asthma Son    Fibroids Mother        fibroid tumor in uterus    Social History   Tobacco Use   Smoking status: Every Day    Packs/day: 0.50    Years: 10.00    Pack years: 5.00    Types: Cigarettes   Smokeless tobacco: Never  Vaping Use   Vaping Use: Never used  Substance Use Topics   Alcohol use: No    Alcohol/week: 0.0 standard drinks    Comment: occ   Drug use: No    Comment: Per patient exposed to second hand Marijuana smoke    Home Medications Prior to Admission medications   Medication Sig Start Date End Date Taking? Authorizing Provider  acetaminophen (TYLENOL) 500 MG tablet Take 1,000 mg by mouth every 8 (eight) hours as needed for mild pain or headache.     [provider]  ibuprofen (ADVIL) 600 MG tablet Take 1 tablet (600 mg total) by mouth every 6 (six) hours as needed. 03/14/19   Bethel Born, PA-C  ibuprofen (ADVIL,MOTRIN) 600 MG tablet Take 1 tablet (600 mg total) by mouth every 8 (eight) hours  as needed for moderate pain or cramping. 09/26/17   Tamera Stands, DO  medroxyPROGESTERone (DEPO-PROVERA) 150 MG/ML injection Inject 1 mL (150 mg total) into the muscle every 3 (three) months. 11/16/17   Cheral Marker, CNM  Prenatal Vit-Fe Fumarate-FA (PREPLUS) 27-1 MG TABS TAKE 1 TABLET BY MOUTH DAILY AT 12 NOON. 06/25/17   Cheral Marker, CNM  albuterol (PROVENTIL HFA;VENTOLIN HFA) 108 (90 Base) MCG/ACT inhaler Inhale 2 puffs into the lungs every 6 (six) hours as needed for wheezing or shortness of breath. Patient not taking: Reported on 12/13/2017 02/06/17 03/14/19  Adline Potter, NP  omeprazole (PRILOSEC) 20 MG capsule Take 1 capsule (20 mg total) by mouth daily. Patient not taking: Reported on 11/16/2017 04/04/17 03/14/19  Cresenzo-Dishmon, Scarlette Calico, CNM  sertraline (ZOLOFT) 25 MG tablet Take 1 tablet (25 mg total) by mouth daily. Patient not taking: Reported on 12/13/2017 11/16/17 03/14/19  Cheral Marker, CNM    Allergies    Strawberry extract and Latex  Review of Systems   Review of Systems  Constitutional:  Positive for activity change, appetite change and fever.  HENT:  Positive for congestion and sore throat.   Eyes:  Positive for redness. Negative for visual disturbance.  Respiratory:  Negative for cough, chest tightness and shortness of breath.   Gastrointestinal:  Positive for diarrhea, nausea and vomiting.  Genitourinary:  Negative for dysuria and hematuria.  Musculoskeletal:  Positive for arthralgias and myalgias.  Neurological:  Positive for dizziness, weakness, light-headedness and headaches.   all other systems are negative except as noted in the HPI and PMH.   Physical Exam Updated Vital Signs BP (!) 158/114   Pulse 96   Temp 98.5 F (36.9 C) (Oral)   Resp 18   Ht 5\' 3"  (1.6 m)   Wt 79.4 kg   LMP 09/19/2020   SpO2 98%   BMI 31.00 kg/m   Physical Exam Vitals and nursing note reviewed.  Constitutional:      General: She is not in acute  distress.    Appearance: She is well-developed.  HENT:     Head: Normocephalic and atraumatic.     Mouth/Throat:     Pharynx: No oropharyngeal exudate.  Eyes:     Conjunctiva/sclera: Conjunctivae normal.     Pupils: Pupils  are equal, round, and reactive to light.     Comments: Right eye has injected sclera and subconjunctival hemorrhage.  No hyphema.  Extraocular movements are intact. No areas of fluorescein uptake.  Intraocular pressure is 22-25 Abrasion underneath right eye with tenderness along orbital rim  Neck:     Comments: No meningismus. Cardiovascular:     Rate and Rhythm: Normal rate and regular rhythm.     Heart sounds: Normal heart sounds. No murmur heard. Pulmonary:     Effort: Pulmonary effort is normal. No respiratory distress.     Breath sounds: Normal breath sounds.  Abdominal:     Palpations: Abdomen is soft.     Tenderness: There is no abdominal tenderness. There is no guarding or rebound.  Musculoskeletal:        General: No tenderness. Normal range of motion.     Cervical back: Normal range of motion and neck supple.  Skin:    General: Skin is warm.  Neurological:     Mental Status: She is alert and oriented to person, place, and time.     Cranial Nerves: No cranial nerve deficit.     Motor: No abnormal muscle tone.     Coordination: Coordination normal.     Comments:  5/5 strength throughout. CN 2-12 intact.Equal grip strength.   Psychiatric:        Behavior: Behavior normal.    ED Results / Procedures / Treatments   Labs (all labs ordered are listed, but only abnormal results are displayed) Labs Reviewed  CBC WITH DIFFERENTIAL/PLATELET - Abnormal; Notable for the following components:      Result Value   Hemoglobin 11.4 (*)    All other components within normal limits  COMPREHENSIVE METABOLIC PANEL - Abnormal; Notable for the following components:   Calcium 8.1 (*)    AST 52 (*)    Anion gap <3 (*)    All other components within normal limits   URINALYSIS, ROUTINE W REFLEX MICROSCOPIC - Abnormal; Notable for the following components:   Specific Gravity, Urine <1.005 (*)    All other components within normal limits  RESP PANEL BY RT-PCR (FLU A&B, COVID) ARPGX2  I-STAT BETA HCG BLOOD, ED ([facility], [facility], [facility] ONLY)    EKG None  Radiology CT HEAD WO CONTRAST ( )  Result Date: 10/12/2020 CLINICAL DATA:  Head trauma, moderate to severe. Loss of taste and smell. Headache. Loss of balance and fell Friday. Injury to the right eye. EXAM: CT HEAD WITHOUT CONTRAST TECHNIQUE: Contiguous axial images were obtained from the base of the skull through the vertex without intravenous contrast. COMPARISON:  CT head 03/10/2016.  CT orbits 10/12/2020 FINDINGS: Brain: No evidence of acute infarction, hemorrhage, hydrocephalus, extra-axial collection or mass lesion/mass effect. Vascular: No hyperdense vessel or unexpected calcification. Skull: Normal. Negative for fracture or focal lesion. Sinuses/Orbits: Mucosal thickening in the paranasal sinuses. No acute air-fluid levels. Other: None. IMPRESSION: No acute intracranial abnormalities. Electronically Signed   By: Burman Nieves M.D.   On: 10/12/2020 01:47   DG Chest Portable 1 View  Result Date: 10/12/2020 CLINICAL DATA:  Cough EXAM: PORTABLE CHEST 1 VIEW COMPARISON:  03/10/2016 FINDINGS: The heart size and mediastinal contours are within normal limits. Both lungs are clear. The visualized skeletal structures are unremarkable. IMPRESSION: No active disease. Electronically Signed   By: Wiliam Ke M.D.   On: 10/12/2020 00:42   CT Orbits Wo Contrast  Result Date: 10/12/2020 CLINICAL DATA:  Facial trauma. Lost taste and smell. Headache. Lost  balance leading to fall on Friday injuring right eye. EXAM: CT ORBITS WITHOUT CONTRAST TECHNIQUE: Multidetector CT imaging of the orbits was performed using the standard protocol without intravenous contrast. Multiplanar CT image reconstructions were also generated.  COMPARISON:  Head CT 03/10/2016, no dedicated face exams available. FINDINGS: Orbits: No acute orbital fracture. No evidence of globe injury. No orbital inflammation. Visible paranasal sinuses: Mucosal thickening throughout the ethmoid air cells, chronic but increased from prior exam. Minimal mucosal thickening of the maxillary sinuses. No sinus fluid level or fracture. Soft tissues: Focal abnormality. Osseous: Included nasal bone, zygomatic arches, and mandibles are intact. Poor dentition with occasional periapical lucencies involving the included upper teeth. Limited intracranial: Assessed on concurrent head CT, reported separately. IMPRESSION: 1. No acute orbital fracture. 2. Chronic paranasal sinus mucosal thickening. Electronically Signed   By: Narda Rutherford M.D.   On: 10/12/2020 01:28    Procedures Procedures   Medications Ordered in ED Medications  fluorescein ophthalmic strip 1 strip (has no administration in time range)  tetracaine (PONTOCAINE) 0.5 % ophthalmic solution 2 drop (has no administration in time range)  sodium chloride 0.9 % bolus 1,000 mL (has no administration in time range)    ED Course  I have reviewed the triage vital signs and the nursing notes.  Pertinent labs & imaging results that were available during my care of the patient were reviewed by me and considered in my medical decision making (see chart for details).    MDM Rules/Calculators/A&P                          Generalized weakness for 1 week with intermittent nausea and vomiting.  No fever.  Nonfocal neurological exam.  Abdomen soft without peritoneal signs. Urinalysis negative, hCG is negative. CT head and orbits obtained given her facial trauma with evidence of eye injury.  This is negative for intracranial hemorrhage or orbital fracture.  Visual Acuity R Distance: 20/30 L Distance: 20/20 No hyphema. No corneal abrasion. Seidel sign negative, globe intact.  COVID testing today is negative.   Vitals are stable.  Patient is tolerating p.o. and ambulatory.  Unclear etiology of her fatigue.  May be due to viral syndrome.  No evidence of UTI.  We will treat supportively with anti-inflammatories and antipyretics at home, PCP follow-up. Followup with ophthalmology as well.  Return precautions discussed.   TELECIA LAROCQUE was evaluated in Emergency Department on 10/12/2020 for the symptoms described in the history of present illness. She was evaluated in the context of the global COVID-19 pandemic, which necessitated consideration that the patient might be at risk for infection with the SARS-CoV-2 virus that causes COVID-19. Institutional protocols and algorithms that pertain to the evaluation of patients at risk for COVID-19 are in a state of rapid change based on information released by regulatory bodies including the CDC and federal and state organizations. These policies and algorithms were followed during the patient's care in the ED.  Final Clinical Impression(s) / ED Diagnoses Final diagnoses:  Generalized weakness  Fatigue, unspecified type  Subconjunctival hemorrhage of right eye    Rx / DC Orders ED Discharge Orders     None        Erika Slaby, Jeannett Senior, MD 10/12/20 [phone]

## 2020-10-12 NOTE — Discharge Instructions (Signed)
COVID testing today is negative.  No evidence of urinary tract infection or pneumonia.  Keep yourself hydrated.  Follow-up with your doctor.  Return to the ED if develop new or worsening symptoms.

## 2020-12-12 ENCOUNTER — Emergency Department (HOSPITAL_COMMUNITY)
Admission: EM | Admit: 2020-12-12 | Discharge: 2020-12-12 | Disposition: A | Discharge status: Home / Self Care | Payer: Medicaid Other | Attending: Emergency Medicine | Admitting: Emergency Medicine

## 2020-12-12 ENCOUNTER — Other Ambulatory Visit: Payer: Self-pay

## 2020-12-12 DIAGNOSIS — Z91040 Latex allergy status: Secondary | ICD-10-CM | POA: Insufficient documentation

## 2020-12-12 DIAGNOSIS — J3489 Other specified disorders of nose and nasal sinuses: Secondary | ICD-10-CM | POA: Insufficient documentation

## 2020-12-12 DIAGNOSIS — R599 Enlarged lymph nodes, unspecified: Secondary | ICD-10-CM | POA: Insufficient documentation

## 2020-12-12 DIAGNOSIS — F17210 Nicotine dependence, cigarettes, uncomplicated: Secondary | ICD-10-CM | POA: Insufficient documentation

## 2020-12-12 DIAGNOSIS — J029 Acute pharyngitis, unspecified: Secondary | ICD-10-CM | POA: Diagnosis present

## 2020-12-12 DIAGNOSIS — J45909 Unspecified asthma, uncomplicated: Secondary | ICD-10-CM | POA: Diagnosis not present

## 2020-12-12 DIAGNOSIS — U071 COVID-19: Secondary | ICD-10-CM | POA: Diagnosis not present

## 2020-12-12 DIAGNOSIS — R000 Tachycardia, unspecified: Secondary | ICD-10-CM | POA: Diagnosis not present

## 2020-12-12 LAB — RESP PANEL BY RT-PCR (FLU A&B, COVID) ARPGX2
Influenza A by PCR: NEGATIVE
Influenza B by PCR: NEGATIVE
SARS Coronavirus 2 by RT PCR: POSITIVE — AB

## 2020-12-12 LAB — GROUP A STREP BY PCR: Group A Strep by PCR: NOT DETECTED

## 2020-12-12 MED ORDER — ACETAMINOPHEN 325 MG PO TABS
650.0000 mg | ORAL_TABLET | Freq: Once | ORAL | Status: AC
  Administered 2020-12-12: 650 mg via ORAL
  Filled 2020-12-12: qty 2

## 2020-12-12 NOTE — ED Triage Notes (Signed)
Headache, body aches, cough, chills, fever, nasal and chest congestion x 3 days. Has not taken ibuprofen or tylenol.

## 2020-12-12 NOTE — ED Provider Notes (Signed)
[facility] EMERGENCY DEPARTMENT Provider Note   CSN: [id number] Arrival date & time: 12/12/20  1837     History Chief Complaint  Patient presents with   Cough   Sore Throat    [name] is a 35 y.o. female.  With no pertinent past medical history who presents to the emergency department with body aches and sore throat that began this morning.  She also endorses coughing, headache, cold chills and sweats, congestion.  She states that she had fever of 101 at home and took Tylenol and defervesced.  She does not know she had any sick contacts recently.  She denies chest pain, shortness of breath, difficulty swallowing, lightheadedness or dizziness.  She denies any nausea, vomiting, diarrhea.  She is tolerating fluids and p.o. diet.   Cough Associated symptoms: fever and rhinorrhea   Associated symptoms: no chest pain and no shortness of breath   Sore Throat Pertinent negatives include no chest pain, no abdominal pain and no shortness of breath.      Past Medical History:  Diagnosis Date   Abnormal pap 10/2011   Abnormal Pap smear    Abortion    Anxiety    Anxiety    Asthma    Back pain 11/18/2012   History of abnormal cervical Pap smear 01/13/2014   History of bronchitis    History of chlamydia    History of gonorrhea    History of trichomoniasis    Mental disorder    anxiety   Nausea and vomiting 11/18/2012   Nausea and vomiting during pregnancy 06/11/2015   Pregnant 06/11/2015   Urge incontinence 01/13/2014   Vaginal Pap smear, abnormal     Patient Active Problem List   Diagnosis Date Noted   Closed fracture of distal end of left fibula with routine healing 03/22/2020   Postpartum depression 11/16/2017   Trichomonal vaginitis during pregnancy in second trimester 05/09/2017   Smoker 06/30/2015   Anxiety 06/30/2015   Urge incontinence 01/13/2014   History of abnormal cervical Pap smear 01/13/2014   PTSD (post-traumatic stress disorder) 07/31/2013    History of chlamydia 05/27/2012    Past Surgical History:  Procedure Laterality Date   ELECTIVE ABORTION     MOUTH SURGERY       OB History     Gravida  7   Para  2   Term  2   Preterm      AB  5   Living  2      SAB  2   IAB  3   Ectopic      Multiple  0   Live Births  2           Family History  Problem Relation Age of Onset   Cancer Maternal Aunt        breast   Diabetes Maternal Grandmother    Hypertension Maternal Grandmother    Hypertension Maternal Grandfather    Schizophrenia Brother    Seizures Brother    Asthma Son    Fibroids Mother        fibroid tumor in uterus    Social History   Tobacco Use   Smoking status: Every Day    Packs/day: 0.50    Years: 10.00    Pack years: 5.00    Types: Cigarettes   Smokeless tobacco: Never  Vaping Use   Vaping Use: Never used  Substance Use Topics   Alcohol use: No    Alcohol/week: 0.0 standard drinks  Comment: occ   Drug use: No    Comment: Per patient exposed to second hand Marijuana smoke    Home Medications Prior to Admission medications   Medication Sig Start Date End Date Taking? Authorizing Provider  acetaminophen (TYLENOL) 500 MG tablet Take 1,000 mg by mouth every 8 (eight) hours as needed for mild pain or headache.     [provider]  ibuprofen (ADVIL) 600 MG tablet Take 1 tablet (600 mg total) by mouth every 6 (six) hours as needed. 03/14/19   Bethel Born, PA-C  ibuprofen (ADVIL,MOTRIN) 600 MG tablet Take 1 tablet (600 mg total) by mouth every 8 (eight) hours as needed for moderate pain or cramping. 09/26/17   Tamera Stands, DO  medroxyPROGESTERone (DEPO-PROVERA) 150 MG/ML injection Inject 1 mL (150 mg total) into the muscle every 3 (three) months. 11/16/17   Cheral Marker, CNM  Prenatal Vit-Fe Fumarate-FA (PREPLUS) 27-1 MG TABS TAKE 1 TABLET BY MOUTH DAILY AT 12 NOON. 06/25/17   Cheral Marker, CNM  albuterol (PROVENTIL HFA;VENTOLIN HFA) 108 (90 Base)  MCG/ACT inhaler Inhale 2 puffs into the lungs every 6 (six) hours as needed for wheezing or shortness of breath. Patient not taking: Reported on 12/13/2017 02/06/17 03/14/19  Adline Potter, NP  omeprazole (PRILOSEC) 20 MG capsule Take 1 capsule (20 mg total) by mouth daily. Patient not taking: Reported on 11/16/2017 04/04/17 03/14/19  Cresenzo-Dishmon, Scarlette Calico, CNM  sertraline (ZOLOFT) 25 MG tablet Take 1 tablet (25 mg total) by mouth daily. Patient not taking: Reported on 12/13/2017 11/16/17 03/14/19  Cheral Marker, CNM    Allergies    Strawberry extract and Latex  Review of Systems   Review of Systems  Constitutional:  Positive for fatigue and fever.  HENT:  Positive for congestion and rhinorrhea.   Respiratory:  Positive for cough. Negative for shortness of breath.   Cardiovascular:  Negative for chest pain.  Gastrointestinal:  Negative for abdominal pain, nausea and vomiting.  Neurological:  Negative for dizziness and light-headedness.  All other systems reviewed and are negative.  Physical Exam Updated Vital Signs BP 126/81 (BP Location: Right Arm)   Pulse (!) 121   Temp (!) 101.4 F (38.6 C) (Oral)   Resp 20   Ht 5\' 3"  (1.6 m)   Wt 83 kg   SpO2 100%   BMI 32.42 kg/m   Physical Exam Vitals and nursing note reviewed.  Constitutional:      General: She is not in acute distress.    Appearance: She is well-developed. She is ill-appearing. She is not toxic-appearing.  HENT:     Head: Normocephalic and atraumatic.     Nose: Congestion present.     Mouth/Throat:     Mouth: Mucous membranes are moist.     Pharynx: Posterior oropharyngeal erythema present.     Tonsils: No tonsillar exudate.  Eyes:     General: No scleral icterus.    Conjunctiva/sclera: Conjunctivae normal.     Pupils: Pupils are equal, round, and reactive to light.  Cardiovascular:     Rate and Rhythm: Tachycardia present.     Heart sounds: Normal heart sounds. No murmur heard. Pulmonary:      Effort: Pulmonary effort is normal. No respiratory distress.     Breath sounds: Normal breath sounds.  Abdominal:     Palpations: Abdomen is soft.  Musculoskeletal:     Cervical back: Normal range of motion.  Lymphadenopathy:     Cervical: Cervical adenopathy present.  Skin:    General: Skin is warm and dry.     Capillary Refill: Capillary refill takes less than 2 seconds.     Findings: No rash.  Neurological:     General: No focal deficit present.     Mental Status: She is alert and oriented to person, place, and time.  Psychiatric:        Mood and Affect: Mood normal.        Behavior: Behavior normal.        Thought Content: Thought content normal.        Judgment: Judgment normal.    ED Results / Procedures / Treatments   Labs (all labs ordered are listed, but only abnormal results are displayed) Labs Reviewed  RESP PANEL BY RT-PCR (FLU A&B, COVID) ARPGX2 - Abnormal; Notable for the following components:      Result Value   SARS Coronavirus 2 by RT PCR POSITIVE (*)    All other components within normal limits  GROUP A STREP BY PCR   EKG None  Radiology No results found.  Procedures Procedures   Medications Ordered in ED Medications  acetaminophen (TYLENOL) tablet 650 mg (650 mg Oral Given 12/12/20 1912)    ED Course  I have reviewed the triage vital signs and the nursing notes.  Pertinent labs & imaging results that were available during my care of the patient were reviewed by me and considered in my medical decision making (see chart for details).    MDM Rules/Calculators/A&P 35 year old female who presents emergency department with viral upper respiratory symptoms.  COVID-positive. Considered but doubt any concomitant urgent processes such as ACS, CHF.  Pneumothorax, pneumonia underlying.  She is nontoxic in appearance although she is ill-appearing.  She does not need any emergent medical intervention at this time.  I have told her to self isolate at home  and follow CDC guidelines for when she is able to return to the work.  She is instructed to use Tylenol and ibuprofen as needed for relief of fever or other symptoms.  She can also use over-the-counter cough and cold medications.  She is instructed to return to emergency department should she begin having shortness of breath or difficulty breathing.  Relies understanding.  Safe for discharge Final Clinical Impression(s) / ED Diagnoses Final diagnoses:  COVID-19    Rx / DC Orders ED Discharge Orders     None        Cristopher Peru, PA-C 12/13/20 0006    Bethann Berkshire, MD 12/13/20 1050

## 2020-12-12 NOTE — ED Triage Notes (Signed)
Possibility of pregnancy.

## 2020-12-12 NOTE — ED Notes (Signed)
Pt refused covid swab. °

## 2020-12-12 NOTE — Discharge Instructions (Addendum)
You are seen in the emergency department today for body aches, cough and headache.  While you were here you diagnosed with COVID-19.  Please quarantine at home and follow CDC guidelines for returning to work or public.  Please continue to wear your mask.  He may use Tylenol and ibuprofen as needed for fever and body aches.  You can also use over-the-counter cough and cold medication.  Please return for sustained fever that not responsive to medication or increasing shortness of breath

## 2021-01-23 NOTE — L&D Delivery Note (Signed)
Delivery Note At 4:41 AM a viable female was delivered via  (Presentation:   OA   ).  APGAR: 8, 9; weight   3030g.   Placenta status: Spontaneous, Intact.  Cord:   with the following complications:  .  Cord pH:   Anesthesia: Epidural Episiotomy: None Lacerations:  none Suture Repair:  NA Est. Blood Loss (mL):  50  Mom to postpartum.  Baby to Couplet care / Skin to Skin.  [name] 07/04/2021, 5:00 AM

## 2021-03-02 ENCOUNTER — Other Ambulatory Visit: Payer: Medicaid Other | Admitting: Adult Health

## 2021-03-16 ENCOUNTER — Other Ambulatory Visit (HOSPITAL_COMMUNITY)
Admission: RE | Admit: 2021-03-16 | Discharge: 2021-03-16 | Disposition: A | Discharge status: Home / Self Care | Payer: Medicaid Other | Source: Ambulatory Visit | Attending: Adult Health | Admitting: Adult Health

## 2021-03-16 ENCOUNTER — Ambulatory Visit (INDEPENDENT_AMBULATORY_CARE_PROVIDER_SITE_OTHER): Payer: Medicaid Other | Admitting: Advanced Practice Midwife

## 2021-03-16 ENCOUNTER — Other Ambulatory Visit: Payer: Self-pay

## 2021-03-16 ENCOUNTER — Encounter: Payer: Self-pay | Admitting: Advanced Practice Midwife

## 2021-03-16 VITALS — BP 123/69 | HR 88 | Ht 62.0 in | Wt 187.0 lb

## 2021-03-16 DIAGNOSIS — Z124 Encounter for screening for malignant neoplasm of cervix: Secondary | ICD-10-CM | POA: Diagnosis not present

## 2021-03-16 DIAGNOSIS — Z363 Encounter for antenatal screening for malformations: Secondary | ICD-10-CM | POA: Diagnosis not present

## 2021-03-16 DIAGNOSIS — N926 Irregular menstruation, unspecified: Secondary | ICD-10-CM | POA: Diagnosis not present

## 2021-03-16 DIAGNOSIS — Z3482 Encounter for supervision of other normal pregnancy, second trimester: Secondary | ICD-10-CM

## 2021-03-16 DIAGNOSIS — O0930 Supervision of pregnancy with insufficient antenatal care, unspecified trimester: Secondary | ICD-10-CM

## 2021-03-16 DIAGNOSIS — Z3400 Encounter for supervision of normal first pregnancy, unspecified trimester: Secondary | ICD-10-CM | POA: Diagnosis not present

## 2021-03-16 LAB — POCT URINE PREGNANCY: Preg Test, Ur: POSITIVE — AB

## 2021-03-16 MED ORDER — BLOOD PRESSURE MONITOR MISC: 0 refills | Status: DC

## 2021-03-16 NOTE — Progress Notes (Signed)
WELL-WOMAN EXAMINATION Patient name: [name] MRN [id number]  Date of birth: [birth year] Chief Complaint:   New Patient (Initial Visit) and Gynecologic Exam  History of Present Illness:   [name] is a 36 y.o. [phone] African-American female being seen today for a routine well-woman exam. Of note, she has been feeling fetal movement x 1 month now. Current complaints: doing well  PCP: none      Patient's last menstrual period was 10/17/2020 (approximate). The current method of family planning is  pregnant .  Last pap June 2018. Results were: NILM w/ HRHPV not done. H/O abnormal pap: yes  Depression screen [facility] 2/9 03/16/2021 03/07/2017 02/06/2017  Decreased Interest 0 1 0  Down, Depressed, Hopeless 0 0 1  PHQ - 2 Score 0 1 1  Altered sleeping 0 3 2  Tired, decreased energy 0 3 1  Change in appetite 0 1 1  Feeling bad or failure about yourself  0 0 0  Trouble concentrating 0 0 0  Moving slowly or fidgety/restless 0 0 0  Suicidal thoughts 0 0 0  PHQ-9 Score 0 8 5  Difficult doing work/chores - Not difficult at all Not difficult at all     GAD 7 : Generalized Anxiety Score 03/16/2021  Nervous, Anxious, on Edge 3  Control/stop worrying 3  Worry too much - different things 3  Trouble relaxing 0  Restless 0  Easily annoyed or irritable 0  Afraid - awful might happen 0  Total GAD 7 Score 9     Review of Systems:   Pertinent items are noted in HPI Denies any headaches, blurred vision, fatigue, shortness of breath, chest pain, abdominal pain, abnormal vaginal discharge/itching/odor/irritation, problems with periods, bowel movements, urination, or intercourse unless otherwise stated above. Pertinent History Reviewed:  Reviewed past medical,surgical, social and family history.  Reviewed problem list, medications and allergies. Physical Assessment:   Vitals:   03/16/21 1512  BP: 123/69  Pulse: 88  Weight: 187 lb (84.8 kg)  Height: 5\' 2"  (1.575 m)  Body mass  index is 34.2 kg/m.        Physical Examination:   General appearance - well appearing, and in no distress  Mental status - alert, oriented to person, place, and time  Psych:  She has a normal mood and affect  Skin - warm and dry, normal color, no suspicious lesions noted  Chest - effort normal, all lung fields clear to auscultation bilaterally  Heart - normal rate and regular rhythm  Neck:  midline trachea, no thyromegaly or nodules  Abdomen - soft, nontender, fundal height 24cm; FHTs 145  Pelvic - VULVA: normal appearing vulva with no masses, tenderness or lesions  VAGINA: normal appearing vagina with normal color and discharge, no lesions  CERVIX: normal appearing cervix without discharge or lesions, no CMT  Thin prep pap is done with HR HPV cotesting  Extremities:  No swelling or varicosities noted  Chaperone:    Results for orders placed or performed in visit on 03/16/21 (from the past 24 hour(s))  POCT urine pregnancy   Collection Time: 03/16/21  3:21 PM  Result Value Ref Range   Preg Test, Ur Positive (A) Negative    Assessment & Plan:  1) Here for Well-Woman Exam  2) Preg approx 24wks by FH, will get first avail anatomy scan; 2-3wks LROB with PN2; NOB labs today; reports SVB 2009 and 2019 with low risk preg/deliveries  Labs/procedures today: NOB labs, Pap  Orders Placed This Encounter  Procedures   Urine Culture   US OB Comp + 14 Wk   CBC/D/Plt+RPR+Rh+ABO+RubIgG...   POCT urine pregnancy    Meds:  Meds ordered this encounter  Medications   Blood Pressure Monitor MISC    Sig: For regular home bp monitoring during pregnancy    Dispense:  1 each    Refill:  0    Please mail to patient    Follow-up: Return for first avail anatomy u/s: 2-3 wks LROB with PN2.  Arabella Merles CNM 03/16/2021 3:45 PM

## 2021-03-16 NOTE — Patient Instructions (Signed)
Lynda Rainwater, I greatly value your feedback.  If you receive a survey following your visit with [place] today, we appreciate you taking the time to fill it out.  Thanks, Philipp Deputy, CNM   You will have your sugar test next visit.  Please do not eat or drink anything after midnight the night before you come, not even water.  You will be here for at least two hours.  Please make an appointment online for the bloodwork at [email or web address] for 8:30am (or as close to this as possible). Make sure you select the [facility] service center. The day of the appointment, check in with our office first, then you will go to [facility] to start the sugar test.    [facility] HAS MOVED!!! It is now [facility] ([address] [place], [place] [address]) Entrance C, located off of [address] valet parking  Go to [email or web address] to register for FREE online childbirth classes   Call the office [phone]) or go to [facility] if: You begin to have strong, frequent contractions Your water breaks.  Sometimes it is a big gush of fluid, sometimes it is just a trickle that keeps getting your panties wet or running down your legs You have vaginal bleeding.  It is normal to have a small amount of spotting if your cervix was checked.  You don't feel your baby moving like normal.  If you don't, get you something to eat and drink and lay down and focus on feeling your baby move.   If your baby is still not moving like normal, you should call the office or go to [facility].  [place] Pediatricians/Family Doctors: [facility] [phone]           [facility] [phone]                [facility] [phone] (usually not accepting new patients unless you have family there already, you are always welcome to call and ask)      [facility] Department [phone]       [facility] Pediatricians/Family Doctors:  [facility]: [phone] [facility]: [phone] [facility]: [phone]  [facility] Doctors:  [facility]: [phone]  [facility]: [phone]  [facility] Doctors: [facility]: [phone]   Home Blood Pressure Monitoring for Patients   Your provider has recommended that you check your blood pressure (BP) at least once a week at home. If you do not have a blood pressure cuff at home, one will be provided for you. Contact your provider if you have not received your monitor within 1 week.   Helpful Tips for Accurate Home Blood Pressure Checks  Don't smoke, exercise, or drink caffeine 30 minutes before checking your BP Use the restroom before checking your BP (a full bladder can raise your pressure) Relax in a comfortable upright chair Feet on the ground Left arm resting comfortably on a flat surface at the level of your heart Legs uncrossed Back supported Sit quietly and don't talk Place the cuff on your bare arm Adjust snuggly, so that only two fingertips can fit between your skin and the top of the cuff Check 2 readings separated by at least one minute Keep a log of your BP readings For a visual, please reference this diagram: http://ccnc.care/bpdiagram  Provider Name: [facility]     Phone: [phone]  Zone 1: ALL CLEAR  Continue to monitor your symptoms:  BP reading is less than 140 (top number) or less than 90 (bottom number)  No right upper stomach pain No headaches or seeing spots No feeling nauseated or throwing up No swelling in face and hands  Zone 2: CAUTION Call your doctor's office for any of the following:  BP reading is greater than 140 (top number) or greater than 90 (bottom number)  Stomach pain under your ribs in the middle or right side Headaches or seeing spots Feeling nauseated or throwing up Swelling in face and hands  Zone 3: EMERGENCY   Seek immediate medical care if you have any of the following:  BP reading is greater than160 (top number) or greater than 110 (bottom number) Severe headaches not improving with Tylenol Serious difficulty catching your breath Any worsening symptoms from Zone 2   Second Trimester of Pregnancy The second trimester is from week 13 through week 28, months 4 through 6. The second trimester is often a time when you feel your best. Your body has also adjusted to being pregnant, and you begin to feel better physically. Usually, morning sickness has lessened or quit completely, you may have more energy, and you may have an increase in appetite. The second trimester is also a time when the fetus is growing rapidly. At the end of the sixth month, the fetus is about 9 inches long and weighs about 1 pounds. You will likely begin to feel the baby move (quickening) between 18 and 20 weeks of the pregnancy. BODY CHANGES Your body goes through many changes during pregnancy. The changes vary from woman to woman.  Your weight will continue to increase. You will notice your lower abdomen bulging out. You may begin to get stretch marks on your hips, abdomen, and breasts. You may develop headaches that can be relieved by medicines approved by your health care provider. You may urinate more often because the fetus is pressing on your bladder. You may develop or continue to have heartburn as a result of your pregnancy. You may develop constipation because certain hormones are causing the muscles that push waste through your intestines to slow down. You may develop hemorrhoids or swollen, bulging veins (varicose veins). You may have back pain because of the weight gain and pregnancy hormones relaxing your joints between the bones in your pelvis and as a result of a shift in weight and the muscles that support your balance. Your breasts will continue to grow and be tender. Your gums may bleed and may be sensitive to  brushing and flossing. Dark spots or blotches (chloasma, mask of pregnancy) may develop on your face. This will likely fade after the baby is born. A dark line from your belly button to the pubic area (linea nigra) may appear. This will likely fade after the baby is born. You may have changes in your hair. These can include thickening of your hair, rapid growth, and changes in texture. Some women also have hair loss during or after pregnancy, or hair that feels dry or thin. Your hair will most likely return to normal after your baby is born. WHAT TO EXPECT AT YOUR PRENATAL VISITS During a routine prenatal visit: You will be weighed to make sure you and the fetus are growing normally. Your blood pressure will be taken. Your abdomen will be measured to track your baby's growth. The fetal heartbeat will be listened to. Any test results from the previous visit will be discussed. Your health care provider  may ask you: How you are feeling. If you are feeling the baby move. If you have had any abnormal symptoms, such as leaking fluid, bleeding, severe headaches, or abdominal cramping. If you have any questions. Other tests that may be performed during your second trimester include: Blood tests that check for: Low iron levels (anemia). Gestational diabetes (between 24 and 28 weeks). Rh antibodies. Urine tests to check for infections, diabetes, or protein in the urine. An ultrasound to confirm the proper growth and development of the baby. An amniocentesis to check for possible genetic problems. Fetal screens for spina bifida and Down syndrome. HOME CARE INSTRUCTIONS  Avoid all smoking, herbs, alcohol, and unprescribed drugs. These chemicals affect the formation and growth of the baby. Follow your health care provider's instructions regarding medicine use. There are medicines that are either safe or unsafe to take during pregnancy. Exercise only as directed by your health care provider.  Experiencing uterine cramps is a good sign to stop exercising. Continue to eat regular, healthy meals. Wear a good support bra for breast tenderness. Do not use hot tubs, steam rooms, or saunas. Wear your seat belt at all times when driving. Avoid raw meat, uncooked cheese, cat litter boxes, and soil used by cats. These carry germs that can cause birth defects in the baby. Take your prenatal vitamins. Try taking a stool softener (if your health care provider approves) if you develop constipation. Eat more high-fiber foods, such as fresh vegetables or fruit and whole grains. Drink plenty of fluids to keep your urine clear or pale yellow. Take warm sitz baths to soothe any pain or discomfort caused by hemorrhoids. Use hemorrhoid cream if your health care provider approves. If you develop varicose veins, wear support hose. Elevate your feet for 15 minutes, 3-4 times a day. Limit salt in your diet. Avoid heavy lifting, wear low heel shoes, and practice good posture. Rest with your legs elevated if you have leg cramps or low back pain. Visit your dentist if you have not gone yet during your pregnancy. Use a soft toothbrush to brush your teeth and be gentle when you floss. A sexual relationship may be continued unless your health care provider directs you otherwise. Continue to go to all your prenatal visits as directed by your health care provider. SEEK MEDICAL CARE IF:  You have dizziness. You have mild pelvic cramps, pelvic pressure, or nagging pain in the abdominal area. You have persistent nausea, vomiting, or diarrhea. You have a bad smelling vaginal discharge. You have pain with urination. SEEK IMMEDIATE MEDICAL CARE IF:  You have a fever. You are leaking fluid from your vagina. You have spotting or bleeding from your vagina. You have severe abdominal cramping or pain. You have rapid weight gain or loss. You have shortness of breath with chest pain. You notice sudden or extreme swelling  of your face, hands, ankles, feet, or legs. You have not felt your baby move in over an hour. You have severe headaches that do not go away with medicine. You have vision changes. Document Released: 01/03/2001 Document Revised: 01/14/2013 Document Reviewed: 03/12/2012 [facility] Patient Information 2015 [place], [place]. This information is not intended to replace advice given to you by your health care provider. Make sure you discuss any questions you have with your health care provider.

## 2021-03-17 ENCOUNTER — Encounter: Payer: Self-pay | Admitting: Advanced Practice Midwife

## 2021-03-17 DIAGNOSIS — O0930 Supervision of pregnancy with insufficient antenatal care, unspecified trimester: Secondary | ICD-10-CM | POA: Insufficient documentation

## 2021-03-19 LAB — URINE CULTURE

## 2021-03-21 LAB — CYTOLOGY - PAP
Adequacy: ABSENT
Chlamydia: NEGATIVE
Comment: NEGATIVE
Comment: NEGATIVE
Comment: NORMAL
Diagnosis: NEGATIVE
High risk HPV: NEGATIVE
Neisseria Gonorrhea: NEGATIVE

## 2021-03-22 ENCOUNTER — Ambulatory Visit: Payer: Medicaid Other

## 2021-03-22 ENCOUNTER — Other Ambulatory Visit: Payer: Self-pay

## 2021-03-24 ENCOUNTER — Other Ambulatory Visit: Payer: Self-pay

## 2021-03-24 ENCOUNTER — Ambulatory Visit (INDEPENDENT_AMBULATORY_CARE_PROVIDER_SITE_OTHER): Payer: Medicaid Other

## 2021-03-24 DIAGNOSIS — Z3A25 25 weeks gestation of pregnancy: Secondary | ICD-10-CM

## 2021-03-24 DIAGNOSIS — Z363 Encounter for antenatal screening for malformations: Secondary | ICD-10-CM

## 2021-03-24 NOTE — Progress Notes (Addendum)
[place] 25+3 wks,cephalic,posterior placenta gr 0,normal ovaries,FHR 154 bpm,SVP of fluid 7.4 cm,cx 3.4 cm,EFW 803 g,anatomy complete,LVEICF 1.3 mm ?

## 2021-04-07 ENCOUNTER — Encounter: Payer: Self-pay | Admitting: Obstetrics & Gynecology

## 2021-04-07 ENCOUNTER — Ambulatory Visit (INDEPENDENT_AMBULATORY_CARE_PROVIDER_SITE_OTHER): Payer: Medicaid Other | Admitting: Obstetrics & Gynecology

## 2021-04-07 ENCOUNTER — Other Ambulatory Visit: Payer: Self-pay

## 2021-04-07 ENCOUNTER — Other Ambulatory Visit: Payer: Medicaid Other

## 2021-04-07 ENCOUNTER — Telehealth: Payer: Self-pay | Admitting: Obstetrics & Gynecology

## 2021-04-07 VITALS — BP 120/71 | HR 98 | Wt 190.2 lb

## 2021-04-07 DIAGNOSIS — Z3A27 27 weeks gestation of pregnancy: Secondary | ICD-10-CM | POA: Diagnosis not present

## 2021-04-07 DIAGNOSIS — Z3482 Encounter for supervision of other normal pregnancy, second trimester: Secondary | ICD-10-CM

## 2021-04-07 DIAGNOSIS — Z3480 Encounter for supervision of other normal pregnancy, unspecified trimester: Secondary | ICD-10-CM

## 2021-04-07 DIAGNOSIS — Z131 Encounter for screening for diabetes mellitus: Secondary | ICD-10-CM

## 2021-04-07 DIAGNOSIS — K219 Gastro-esophageal reflux disease without esophagitis: Secondary | ICD-10-CM

## 2021-04-07 MED ORDER — FAMOTIDINE 20 MG PO TABS: 20.0000 mg | ORAL_TABLET | Freq: Two times a day (BID) | ORAL | 3 refills | Status: DC | PRN

## 2021-04-07 MED ORDER — PREPLUS 27-1 MG PO TABS: 1.0000 | ORAL_TABLET | Freq: Every day | ORAL | 11 refills | Status: DC

## 2021-04-07 NOTE — Progress Notes (Addendum)
? ?  LOW-RISK PREGNANCY VISIT ?Patient name: [name] MRN [id number]  Date of birth: 1985/11/09 ?Chief Complaint:   ?Routine Prenatal Visit (PN2 today; needs med for acid reflux) ? ?History of Present Illness:   ?[name] is a 36 y.o. [phone] female at [redacted]w[redacted]d with an Estimated Date of Delivery: 07/04/21 being seen today for ongoing management of a low-risk pregnancy.  ? ?-late [facility]- starting at ~ 25wks ?-requesting medication for acid reflux ? ?Depression screen [facility] 2/9 04/07/2021 03/16/2021 03/07/2017 02/06/2017  ?Decreased Interest 0 0 1 0  ?Down, Depressed, Hopeless 0 0 0 1  ?PHQ - 2 Score 0 0 1 1  ?Altered sleeping 0 0 3 2  ?Tired, decreased energy 0 0 3 1  ?Change in appetite 0 0 1 1  ?Feeling bad or failure about yourself  0 0 0 0  ?Trouble concentrating 0 0 0 0  ?Moving slowly or fidgety/restless 0 0 0 0  ?Suicidal thoughts 0 0 0 0  ?PHQ-9 Score 0 0 8 5  ?Difficult doing work/chores - - Not difficult at all Not difficult at all  ? ? ?Today she reports no complaints. Contractions: Not present. Vag. Bleeding: None.  Movement: Present. denies leaking of fluid. ?Review of Systems:   ?Pertinent items are noted in HPI ?Denies abnormal vaginal discharge w/ itching/odor/irritation, headaches, visual changes, shortness of breath, chest pain, abdominal pain, severe nausea/vomiting, or problems with urination or bowel movements unless otherwise stated above. ?Pertinent History Reviewed:  ?Reviewed past medical,surgical, social, obstetrical and family history.  ?Reviewed problem list, medications and allergies. ? ?Physical Assessment:  ? ?Vitals:  ? 04/07/21 1214  ?BP: 120/71  ?Pulse: 98  ?Weight: 190 lb 3.2 oz (86.3 kg)  ?Body mass index is 34.79 kg/m?. ?  ?     Physical Examination:  ? General appearance: Well appearing, and in no distress ? Mental status: Alert, oriented to person, place, and time ? Skin: Warm & dry ? Respiratory: Normal respiratory effort, no distress ? Abdomen: Soft, gravid,  nontender ? Pelvic: Cervical exam deferred        ? Extremities: Edema: None ? Psych:  mood and affect appropriate ? ?Fetal Status: Fetal Heart Rate (bpm): 150 Fundal Height: 27 cm Movement: Present   ? ?Chaperone: n/a   ? ?No results found for this or any previous visit (from the past 24 hour(s)).  ? ?Assessment & Plan:  ?1) Low-risk pregnancy [id number] at [redacted]w[redacted]d with an Estimated Date of Delivery: 07/04/21  ? ? ?Meds: No orders of the defined types were placed in this encounter. ? ?Labs/procedures today: PN2 and NOB lab work ? ?Plan:  Continue routine obstetrical care  ?Next visit: prefers in person   ? ?Reviewed: Preterm labor symptoms and general obstetric precautions including but not limited to vaginal bleeding, contractions, leaking of fluid and fetal movement were reviewed in detail with the patient.  All questions were answered.  ? ?Follow-up: Return in about 3 weeks (around 04/28/2021) for [place] visit and growth (had to reschedule from today's). ? ?No orders of the defined types were placed in this encounter. ? ? ?Janyth Pupa, DO ?Attending [place], Faculty Practice ?[facility] for [place] ? ? ? ?

## 2021-04-07 NOTE — Telephone Encounter (Signed)
Patient states she discussed with Dr Charlotta Newton at her visit today that she is needing a refill on her prenatal vitamin and prescription for acid reflux.  Will send message to Dr Charlotta Newton to have these sent in to her pharmacy.  ?

## 2021-04-07 NOTE — Telephone Encounter (Signed)
Patient called stating that she had an appointment today and they where suppose to call her in a medication but she went to the pharmacy to go pick it up and they state they do not have one for her. Please contact pt ?

## 2021-04-07 NOTE — Addendum Note (Signed)
Addended by: Sharon Seller on: 04/07/2021 04:51 PM ? ? Modules accepted: Orders ? ?

## 2021-04-08 ENCOUNTER — Other Ambulatory Visit: Payer: Self-pay | Admitting: Women's Health

## 2021-04-08 ENCOUNTER — Other Ambulatory Visit: Payer: Medicaid Other

## 2021-04-08 LAB — CBC
Hematocrit: 28.2 % — ABNORMAL LOW (ref 34.0–46.6)
Hemoglobin: 9.9 g/dL — ABNORMAL LOW (ref 11.1–15.9)
MCH: 31.4 pg (ref 26.6–33.0)
MCHC: 35.1 g/dL (ref 31.5–35.7)
MCV: 90 fL (ref 79–97)
Platelets: 277 10*3/uL (ref 150–450)
RBC: 3.15 x10E6/uL — ABNORMAL LOW (ref 3.77–5.28)
RDW: 12.6 % (ref 11.7–15.4)
WBC: 9.9 10*3/uL (ref 3.4–10.8)

## 2021-04-08 LAB — GLUCOSE TOLERANCE, 2 HOURS W/ 1HR
Glucose, 1 hour: 105 mg/dL (ref 70–179)
Glucose, 2 hour: 106 mg/dL (ref 70–152)
Glucose, Fasting: 89 mg/dL (ref 70–91)

## 2021-04-08 LAB — ANTIBODY SCREEN: Antibody Screen: NEGATIVE

## 2021-04-08 LAB — RPR: RPR Ser Ql: NONREACTIVE

## 2021-04-08 LAB — HIV ANTIBODY (ROUTINE TESTING W REFLEX): HIV Screen 4th Generation wRfx: NONREACTIVE

## 2021-04-08 MED ORDER — FERROUS SULFATE 325 (65 FE) MG PO TABS: 325.0000 mg | ORAL_TABLET | ORAL | 2 refills | Status: DC

## 2021-04-18 ENCOUNTER — Encounter: Payer: Self-pay | Admitting: Obstetrics & Gynecology

## 2021-04-28 ENCOUNTER — Encounter: Payer: Self-pay | Admitting: Advanced Practice Midwife

## 2021-04-28 ENCOUNTER — Ambulatory Visit (INDEPENDENT_AMBULATORY_CARE_PROVIDER_SITE_OTHER): Payer: Medicaid Other | Admitting: Advanced Practice Midwife

## 2021-04-28 VITALS — BP 123/72 | HR 102 | Wt 195.0 lb

## 2021-04-28 DIAGNOSIS — Z3A30 30 weeks gestation of pregnancy: Secondary | ICD-10-CM

## 2021-04-28 DIAGNOSIS — F419 Anxiety disorder, unspecified: Secondary | ICD-10-CM

## 2021-04-28 DIAGNOSIS — Z3483 Encounter for supervision of other normal pregnancy, third trimester: Secondary | ICD-10-CM

## 2021-04-28 MED ORDER — SERTRALINE HCL 25 MG PO TABS: 25.0000 mg | ORAL_TABLET | Freq: Every day | ORAL | 6 refills | Status: DC

## 2021-04-28 NOTE — Patient Instructions (Signed)
Lynda Rainwater, I greatly value your feedback.  If you receive a survey following your visit with [place] today, we appreciate you taking the time to fill it out.  ?Thanks, ?Cathie Beams, DNP, CNM ? ?[facility] HAS MOVED!!! ?It is now [facility] ?([address] [place], [place] [address]) ?Entrance located off of [address] ?Free 24/7 valet parking  ? ?Go to [email or web address] to register for FREE online childbirth classes  ?  ?Call the office [phone]) or go to [facility] if: ?You begin to have strong, frequent contractions ?Your water breaks.  Sometimes it is a big gush of fluid, sometimes it is just a trickle that keeps getting your panties wet or running down your legs ?You have vaginal bleeding.  It is normal to have a small amount of spotting if your cervix was checked.  ?You don't feel your baby moving like normal.  If you don't, get you something to eat and drink and lay down and focus on feeling your baby move.  You should feel at least 10 movements in 2 hours.  If you don't, you should call the office or go to [facility].  ? ?Home Blood Pressure Monitoring for Patients  ? ?Your provider has recommended that you check your blood pressure (BP) at least once a week at home. If you do not have a blood pressure cuff at home, one will be provided for you. Contact your provider if you have not received your monitor within 1 week.  ? ?Helpful Tips for Accurate Home Blood Pressure Checks  ?Don't smoke, exercise, or drink caffeine 30 minutes before checking your BP ?Use the restroom before checking your BP (a full bladder can raise your pressure) ?Relax in a comfortable upright chair ?Feet on the ground ?Left arm resting comfortably on a flat surface at the level of your heart ?Legs uncrossed ?Back supported ?Sit quietly and don't talk ?Place the cuff on your bare arm ?Adjust snuggly, so that only two fingertips can fit between your skin and the  top of the cuff ?Check 2 readings separated by at least one minute ?Keep a log of your BP readings ?For a visual, please reference this diagram: http://ccnc.care/bpdiagram ? ?Provider Name: [facility]     Phone: [phone] ? ?Zone 1: ALL CLEAR  ?Continue to monitor your symptoms:  ?BP reading is less than 140 (top number) or less than 90 (bottom number)  ?No right upper stomach pain ?No headaches or seeing spots ?No feeling nauseated or throwing up ?No swelling in face and hands ? ?Zone 2: CAUTION ?Call your doctor's office for any of the following:  ?BP reading is greater than 140 (top number) or greater than 90 (bottom number)  ?Stomach pain under your ribs in the middle or right side ?Headaches or seeing spots ?Feeling nauseated or throwing up ?Swelling in face and hands ? ?Zone 3: EMERGENCY  ?Seek immediate medical care if you have any of the following:  ?BP reading is greater than160 (top number) or greater than 110 (bottom number) ?Severe headaches not improving with Tylenol ?Serious difficulty catching your breath ?Any worsening symptoms from Zone 2  ? ?

## 2021-04-28 NOTE — Addendum Note (Signed)
Addended by: Jacklyn Shell on: 04/28/2021 11:48 AM ? ? Modules accepted: Orders ? ?

## 2021-04-28 NOTE — Progress Notes (Addendum)
? ?  LOW-RISK PREGNANCY VISIT ?Patient name: [name] MRN [id number]  Date of birth: 1985-10-30 ?Chief Complaint:   ?Routine Prenatal Visit (Wants to restart Zoloft-anxiety getting worse) ? ?History of Present Illness:   ?[name] is a 36 y.o. [phone] female at [redacted]w[redacted]d with an Estimated Date of Delivery: 07/04/21 being seen today for ongoing management of a low-risk pregnancy.  ?Today she reports no complaints. Contractions: Not present. Vag. Bleeding: None.  Movement: Present. denies leaking of fluid. ?Review of Systems:   ?Pertinent items are noted in HPI ?Denies abnormal vaginal discharge w/ itching/odor/irritation, headaches, visual changes, shortness of breath, chest pain, abdominal pain, severe nausea/vomiting, or problems with urination or bowel movements unless otherwise stated above. ?Pertinent History Reviewed:  ?Reviewed past medical,surgical, social, obstetrical and family history.  ?Reviewed problem list, medications and allergies. ?Physical Assessment:  ? ?Vitals:  ? 04/28/21 1116  ?BP: 123/72  ?Pulse: (!) 102  ?Weight: 195 lb (88.5 kg)  ?Body mass index is 35.67 kg/m?. ?  ?     Physical Examination:  ? General appearance: Well appearing, and in no distress ? Mental status: Alert, oriented to person, place, and time ? Skin: Warm & dry ? Cardiovascular: Normal heart rate noted ? Respiratory: Normal respiratory effort, no distress ? Abdomen: Soft, gravid, nontender ? Pelvic: Cervical exam deferred        ? Extremities: Edema: None ? ?Fetal Status: Fetal Heart Rate (bpm): 152 Fundal Height: 30 cm Movement: Present   ? ?Chaperone: n/a   ? ?No results found for this or any previous visit (from the past 24 hour(s)).  ?Assessment & Plan:  ?1) Low-risk pregnancy [id number] at [redacted]w[redacted]d with an Estimated Date of Delivery: 07/04/21  ? ?2) was scheduled for a growth [place] last month that got cancelled (? Indication), will reschedule for next month ? ?3)  anxiety--wants to restart zoloft. Rx 25 mg ?  ?Meds: No  orders of the defined types were placed in this encounter. ? ?Labs/procedures today: none ? ?Plan:  Continue routine obstetrical care  ?Next visit: prefers in person   ? ?Reviewed: Preterm labor symptoms and general obstetric precautions including but not limited to vaginal bleeding, contractions, leaking of fluid and fetal movement were reviewed in detail with the patient.  All questions were answered. Has home bp cuff. Check bp weekly, let us know if >140/90.  ? ?Follow-up: Return in about 2 weeks (around 05/12/2021) for LROB and LROB / growth [place] in 4weeks. ? ?Orders Placed This Encounter  ?Procedures  ? US OB Follow Up  ? ?Jacklyn Shell DNP, CNM ?04/28/2021 ?11:41 AM ? ?

## 2021-05-12 ENCOUNTER — Ambulatory Visit (INDEPENDENT_AMBULATORY_CARE_PROVIDER_SITE_OTHER): Payer: Medicaid Other | Admitting: Advanced Practice Midwife

## 2021-05-12 VITALS — BP 136/70 | HR 113 | Wt 195.0 lb

## 2021-05-12 DIAGNOSIS — Z3483 Encounter for supervision of other normal pregnancy, third trimester: Secondary | ICD-10-CM

## 2021-05-12 DIAGNOSIS — Z3A32 32 weeks gestation of pregnancy: Secondary | ICD-10-CM

## 2021-05-12 NOTE — Patient Instructions (Signed)
[name], I greatly value your feedback.  If you receive a survey following your visit with us today, we appreciate you taking the time to fill it out.  ?Thanks, ?Fran Cresenzo-Dishmon, DNP, CNM ? ?[facility]!!! ?It is now [facility] ?([address] [place], [place] [address]) ?Entrance located off of [address] ?Free 24/7 valet parking  ? ?Go to [email or web address] to register for FREE online childbirth classes  ?  ?Call the office ([phone]) or go to [facility] if: ?You begin to have strong, frequent contractions ?Your water breaks.  Sometimes it is a big gush of fluid, sometimes it is just a trickle that keeps getting your panties wet or running down your legs ?You have vaginal bleeding.  It is normal to have a small amount of spotting if your cervix was checked.  ?You don't feel your baby moving like normal.  If you don't, get you something to eat and drink and lay down and focus on feeling your baby move.  You should feel at least 10 movements in 2 hours.  If you don't, you should call the office or go to [facility].  ? ?Home Blood Pressure Monitoring for Patients  ? ?Your provider has recommended that you check your blood pressure (BP) at least once a week at home. If you do not have a blood pressure cuff at home, one will be provided for you. Contact your provider if you have not received your monitor within 1 week.  ? ?Helpful Tips for Accurate Home Blood Pressure Checks  ?Don't smoke, exercise, or drink caffeine 30 minutes before checking your BP ?Use the restroom before checking your BP (a full bladder can raise your pressure) ?Relax in a comfortable upright chair ?Feet on the ground ?Left arm resting comfortably on a flat surface at the level of your heart ?Legs uncrossed ?Back supported ?Sit quietly and don't talk ?Place the cuff on your bare arm ?Adjust snuggly, so that only two fingertips can fit between your skin and the  top of the cuff ?Check 2 readings separated by at least one minute ?Keep a log of your BP readings ?For a visual, please reference this diagram: http://[facility].care/bpdiagram ? ?Provider Name: [facility]     Phone: [phone] ? ?Zone 1: ALL CLEAR  ?Continue to monitor your symptoms:  ?BP reading is less than 140 (top number) or less than 90 (bottom number)  ?No right upper stomach pain ?No headaches or seeing spots ?No feeling nauseated or throwing up ?No swelling in face and hands ? ?Zone 2: CAUTION ?Call your doctor's office for any of the following:  ?BP reading is greater than 140 (top number) or greater than 90 (bottom number)  ?Stomach pain under your ribs in the middle or right side ?Headaches or seeing spots ?Feeling nauseated or throwing up ?Swelling in face and hands ? ?Zone 3: EMERGENCY  ?Seek immediate medical care if you have any of the following:  ?BP reading is greater than160 (top number) or greater than 110 (bottom number) ?Severe headaches not improving with Tylenol ?Serious difficulty catching your breath ?Any worsening symptoms from Zone 2  ? ?

## 2021-05-12 NOTE — Progress Notes (Signed)
? ?  LOW-RISK PREGNANCY VISIT ?Patient name: [name] MRN [id number]  Date of birth: 03-08-1985 ?Chief Complaint:   ?Routine Prenatal Visit ? ?History of Present Illness:   ?[name] is a 36 y.o. [phone] female at [redacted]w[redacted]d with an Estimated Date of Delivery: 07/04/21 being seen today for ongoing management of a low-risk pregnancy.  ?Today she reports no complaints. Contractions: Not present. Vag. Bleeding: None.  Movement: Present. denies leaking of fluid. ?Review of Systems:   ?Pertinent items are noted in HPI ?Denies abnormal vaginal discharge w/ itching/odor/irritation, headaches, visual changes, shortness of breath, chest pain, abdominal pain, severe nausea/vomiting, or problems with urination or bowel movements unless otherwise stated above. ?Pertinent History Reviewed:  ?Reviewed past medical,surgical, social, obstetrical and family history.  ?Reviewed problem list, medications and allergies. ?Physical Assessment:  ? ?Vitals:  ? 05/12/21 1051  ?BP: 136/70  ?Pulse: (!) 113  ?Weight: 195 lb (88.5 kg)  ?Body mass index is 35.67 kg/m?. ?  ?     Physical Examination:  ? General appearance: Well appearing, and in no distress ? Mental status: Alert, oriented to person, place, and time ? Skin: Warm & dry ? Cardiovascular: Normal heart rate noted ? Respiratory: Normal respiratory effort, no distress ? Abdomen: Soft, gravid, nontender ? Pelvic: Cervical exam deferred        ? Extremities: Edema: None ? ?Fetal Status:     Movement: Present   ? ?Chaperone: n/a   ? ?No results found for this or any previous visit (from the past 24 hour(s)).  ?Assessment & Plan:  ?1) Low-risk pregnancy [id number] at [redacted]w[redacted]d with an Estimated Date of Delivery: 07/04/21  ? ? ?  ?Meds: No orders of the defined types were placed in this encounter. ? ?Labs/procedures today: none ? ?Plan:  Continue routine obstetrical care  ?Next visit: prefers will be in person for [place] (ordered by Dr. Charlotta Newton a few months ago--)    ? ?Reviewed: Preterm labor  symptoms and general obstetric precautions including but not limited to vaginal bleeding, contractions, leaking of fluid and fetal movement were reviewed in detail with the patient.  All questions were answered. Has home bp cuff.. Check bp weekly, let us know if >140/90.  ? ?Follow-up: Return for As scheduled. ? ?No orders of the defined types were placed in this encounter. ? ?Jacklyn Shell DNP, CNM ?05/12/2021 ?11:38 AM ? ?

## 2021-05-25 ENCOUNTER — Other Ambulatory Visit: Payer: Self-pay | Admitting: Obstetrics & Gynecology

## 2021-05-25 DIAGNOSIS — O0932 Supervision of pregnancy with insufficient antenatal care, second trimester: Secondary | ICD-10-CM

## 2021-05-26 ENCOUNTER — Ambulatory Visit (INDEPENDENT_AMBULATORY_CARE_PROVIDER_SITE_OTHER): Payer: Medicaid Other

## 2021-05-26 ENCOUNTER — Encounter: Payer: Self-pay | Admitting: Obstetrics & Gynecology

## 2021-05-26 ENCOUNTER — Ambulatory Visit (INDEPENDENT_AMBULATORY_CARE_PROVIDER_SITE_OTHER): Payer: Medicaid Other | Admitting: Obstetrics & Gynecology

## 2021-05-26 VITALS — BP 124/74 | HR 101 | Wt 197.0 lb

## 2021-05-26 DIAGNOSIS — Z3A34 34 weeks gestation of pregnancy: Secondary | ICD-10-CM | POA: Diagnosis not present

## 2021-05-26 DIAGNOSIS — Z3483 Encounter for supervision of other normal pregnancy, third trimester: Secondary | ICD-10-CM

## 2021-05-26 DIAGNOSIS — O0932 Supervision of pregnancy with insufficient antenatal care, second trimester: Secondary | ICD-10-CM

## 2021-05-26 NOTE — Progress Notes (Signed)
Korea 34+3 wks,cephalic,AFI 16.3 cm,right lateral placenta gr 3,FHR 142 bpm,cx 3 cm,EFW 2364 g 37% ?

## 2021-05-26 NOTE — Progress Notes (Signed)
? ?  LOW-RISK PREGNANCY VISIT ?Patient name: [name] MRN [id number]  Date of birth: March 12, 1985 ?Chief Complaint:   ?Routine Prenatal Visit ? ?History of Present Illness:   ?[name] is a 36 y.o. [phone] female at [redacted]w[redacted]d with an Estimated Date of Delivery: 07/04/21 being seen today for ongoing management of a low-risk pregnancy.  ? ?  04/07/2021  ? 12:17 PM 03/16/2021  ?  3:19 PM 03/07/2017  ?  3:55 PM 02/06/2017  ?  4:07 PM  ?Depression screen PHQ 2/9  ?Decreased Interest 0 0 1 0  ?Down, Depressed, Hopeless 0 0 0 1  ?PHQ - 2 Score 0 0 1 1  ?Altered sleeping 0 0 3 2  ?Tired, decreased energy 0 0 3 1  ?Change in appetite 0 0 1 1  ?Feeling bad or failure about yourself  0 0 0 0  ?Trouble concentrating 0 0 0 0  ?Moving slowly or fidgety/restless 0 0 0 0  ?Suicidal thoughts 0 0 0 0  ?PHQ-9 Score 0 0 8 5  ?Difficult doing work/chores   Not difficult at all Not difficult at all  ? ? ?Today she reports no complaints. Contractions: Not present. Vag. Bleeding: None.  Movement: Present. denies leaking of fluid. ?Review of Systems:   ?Pertinent items are noted in HPI ?Denies abnormal vaginal discharge w/ itching/odor/irritation, headaches, visual changes, shortness of breath, chest pain, abdominal pain, severe nausea/vomiting, or problems with urination or bowel movements unless otherwise stated above. ?Pertinent History Reviewed:  ?Reviewed past medical,surgical, social, obstetrical and family history.  ?Reviewed problem list, medications and allergies. ?Physical Assessment:  ? ?Vitals:  ? 05/26/21 1421  ?BP: 124/74  ?Pulse: (!) 101  ?Weight: 197 lb (89.4 kg)  ?Body mass index is 36.03 kg/m?. ?  ?     Physical Examination:  ? General appearance: Well appearing, and in no distress ? Mental status: Alert, oriented to person, place, and time ? Skin: Warm & dry ? Cardiovascular: Normal heart rate noted ? Respiratory: Normal respiratory effort, no distress ? Abdomen: Soft, gravid, nontender ? Pelvic: Cervical exam  deferred        ? Extremities: Edema: None ? ?Fetal Status:     Movement: Present   ? ?Chaperone: n/a   ? ?No results found for this or any previous visit (from the past 24 hour(s)).  ?Assessment & Plan:  ?1) Low-risk pregnancy [id number] at [redacted]w[redacted]d with an Estimated Date of Delivery: 07/04/21  ? ?2) Late entry into [facility],  ?  ?Meds: No orders of the defined types were placed in this encounter. ? ?Labs/procedures today: sonogram ? ?Plan:  Continue routine obstetrical care  ?Next visit: prefers in person   ? ?Reviewed: Preterm labor symptoms and general obstetric precautions including but not limited to vaginal bleeding, contractions, leaking of fluid and fetal movement were reviewed in detail with the patient.  All questions were answered. has home bp cuff. Rx faxed to . Check bp weekly, let us know if >140/90.  ? ?Follow-up: Return in about 2 weeks (around 06/09/2021) for LROB. ? ?No orders of the defined types were placed in this encounter. ? ? ?Lazaro Arms, MD ?05/26/2021 ?2:59 PM ? ?

## 2021-06-09 ENCOUNTER — Encounter: Payer: Medicaid Other | Admitting: Advanced Practice Midwife

## 2021-06-13 ENCOUNTER — Encounter: Payer: Medicaid Other | Admitting: Obstetrics & Gynecology

## 2021-06-13 DIAGNOSIS — O99891 Other specified diseases and conditions complicating pregnancy: Secondary | ICD-10-CM | POA: Diagnosis not present

## 2021-06-13 DIAGNOSIS — Z3A36 36 weeks gestation of pregnancy: Secondary | ICD-10-CM | POA: Diagnosis not present

## 2021-06-13 DIAGNOSIS — O4703 False labor before 37 completed weeks of gestation, third trimester: Secondary | ICD-10-CM | POA: Diagnosis not present

## 2021-06-13 DIAGNOSIS — O479 False labor, unspecified: Secondary | ICD-10-CM | POA: Diagnosis not present

## 2021-06-13 DIAGNOSIS — R39198 Other difficulties with micturition: Secondary | ICD-10-CM | POA: Diagnosis not present

## 2021-06-15 ENCOUNTER — Encounter: Payer: Medicaid Other | Admitting: Obstetrics & Gynecology

## 2021-06-18 ENCOUNTER — Encounter: Payer: Self-pay | Admitting: Advanced Practice Midwife

## 2021-06-24 ENCOUNTER — Encounter: Payer: Self-pay | Admitting: Medical

## 2021-06-24 ENCOUNTER — Other Ambulatory Visit (HOSPITAL_COMMUNITY)
Admission: RE | Admit: 2021-06-24 | Discharge: 2021-06-24 | Disposition: A | Discharge status: Home / Self Care | Payer: Medicaid Other | Source: Ambulatory Visit | Attending: Advanced Practice Midwife | Admitting: Advanced Practice Midwife

## 2021-06-24 ENCOUNTER — Ambulatory Visit (INDEPENDENT_AMBULATORY_CARE_PROVIDER_SITE_OTHER): Payer: Medicaid Other | Admitting: Medical

## 2021-06-24 VITALS — BP 117/69 | HR 104 | Wt 199.4 lb

## 2021-06-24 DIAGNOSIS — O0930 Supervision of pregnancy with insufficient antenatal care, unspecified trimester: Secondary | ICD-10-CM

## 2021-06-24 DIAGNOSIS — O99013 Anemia complicating pregnancy, third trimester: Secondary | ICD-10-CM | POA: Insufficient documentation

## 2021-06-24 DIAGNOSIS — Z3A38 38 weeks gestation of pregnancy: Secondary | ICD-10-CM

## 2021-06-24 DIAGNOSIS — Z3480 Encounter for supervision of other normal pregnancy, unspecified trimester: Secondary | ICD-10-CM

## 2021-06-24 DIAGNOSIS — Z3493 Encounter for supervision of normal pregnancy, unspecified, third trimester: Secondary | ICD-10-CM | POA: Diagnosis not present

## 2021-06-24 DIAGNOSIS — O0933 Supervision of pregnancy with insufficient antenatal care, third trimester: Secondary | ICD-10-CM

## 2021-06-24 NOTE — Progress Notes (Signed)
   PRENATAL VISIT NOTE  Subjective:  [name] is a 36 y.o. [phone] at [redacted]w[redacted]d being seen today for ongoing prenatal care.  She is currently monitored for the following issues for this low-risk pregnancy and has PTSD (post-traumatic stress disorder); History of abnormal cervical Pap smear; Smoker; Anxiety; Supervision of other normal pregnancy, antepartum; Postpartum depression; Closed fracture of distal end of left fibula with routine healing; Late prenatal care; and Anemia affecting pregnancy in third trimester on their problem list.  Patient reports no complaints.  Contractions: Not present. Vag. Bleeding: None.  Movement: Present. Denies leaking of fluid.   The following portions of the patient's history were reviewed and updated as appropriate: allergies, current medications, past family history, past medical history, past social history, past surgical history and problem list.   Objective:   Vitals:   06/24/21 1126  BP: 117/69  Pulse: (!) 104  Weight: 199 lb 6.4 oz (90.4 kg)    Fetal Status: Fetal Heart Rate (bpm): 120 Fundal Height: 38 cm Movement: Present  Presentation: Vertex  General:  Alert, oriented and cooperative. Patient is in no acute distress.  Skin: Skin is warm and dry. No rash noted.   Cardiovascular: Normal heart rate noted  Respiratory: Normal respiratory effort, no problems with respiration noted  Abdomen: Soft, gravid, appropriate for gestational age.  Pain/Pressure: Present     Pelvic: Cervical exam performed in the presence of a chaperone Dilation: 1.5 Effacement (%): 20 Station: -3  Extremities: Normal range of motion.  Edema: Trace  Mental Status: Normal mood and affect. Normal behavior. Normal judgment and thought content.   Assessment and Plan:  Pregnancy: [id number] at [redacted]w[redacted]d 1. Supervision of other normal pregnancy, antepartum - Cervicovaginal ancillary only( [place]) - Culture, beta strep (group b only)  2. Late prenatal care  3. Prenatal  care insufficient, third trimester  4. Anemia affecting pregnancy in third trimester - taking PO iron   5. [redacted] weeks gestation of pregnancy   Term labor symptoms and general obstetric precautions including but not limited to vaginal bleeding, contractions, leaking of fluid and fetal movement were reviewed in detail with the patient. Please refer to After Visit Summary for other counseling recommendations.   Return in about 1 week (around 07/01/2021) for LOB, In-Person, any provider.  No future appointments.  Vonzella Nipple, PA-C

## 2021-06-27 LAB — CERVICOVAGINAL ANCILLARY ONLY
Chlamydia: NEGATIVE
Comment: NEGATIVE
Comment: NORMAL
Neisseria Gonorrhea: NEGATIVE

## 2021-06-28 LAB — CULTURE, BETA STREP (GROUP B ONLY): Strep Gp B Culture: NEGATIVE

## 2021-06-29 ENCOUNTER — Encounter: Payer: Self-pay | Admitting: Advanced Practice Midwife

## 2021-06-29 ENCOUNTER — Other Ambulatory Visit: Payer: Medicaid Other

## 2021-06-29 ENCOUNTER — Ambulatory Visit (INDEPENDENT_AMBULATORY_CARE_PROVIDER_SITE_OTHER): Payer: Medicaid Other | Admitting: Advanced Practice Midwife

## 2021-06-29 VITALS — BP 115/69 | HR 105 | Wt 205.0 lb

## 2021-06-29 DIAGNOSIS — Z3A39 39 weeks gestation of pregnancy: Secondary | ICD-10-CM

## 2021-06-29 DIAGNOSIS — Z3480 Encounter for supervision of other normal pregnancy, unspecified trimester: Secondary | ICD-10-CM

## 2021-06-29 NOTE — Progress Notes (Signed)
   LOW-RISK PREGNANCY VISIT Patient name: [name] MRN [id number]  Date of birth: [birth year] Chief Complaint:   Routine Prenatal Visit (heartburn)  History of Present Illness:   [name] is a 36 y.o. [id number] female at [redacted]w[redacted]d with an Estimated Date of Delivery: 07/04/21 being seen today for ongoing management of a low-risk pregnancy.  Today she reports heartburn. Contractions: Irregular. Vag. Bleeding: None.  Movement: Present. denies leaking of fluid. Review of Systems:   Pertinent items are noted in HPI Denies abnormal vaginal discharge w/ itching/odor/irritation, headaches, visual changes, shortness of breath, chest pain, abdominal pain, severe nausea/vomiting, or problems with urination or bowel movements unless otherwise stated above. Pertinent History Reviewed:  Reviewed past medical,surgical, social, obstetrical and family history.  Reviewed problem list, medications and allergies. Physical Assessment:   Vitals:   06/29/21 1208 06/29/21 1210  BP: 139/78 115/69  Pulse: (!) 104 (!) 105  Weight: 205 lb (93 kg)   Body mass index is 37.49 kg/m.        Physical Examination:   General appearance: Well appearing, and in no distress  Mental status: Alert, oriented to person, place, and time  Skin: Warm & dry  Cardiovascular: Normal heart rate noted  Respiratory: Normal respiratory effort, no distress  Abdomen: Soft, gravid, nontender  Pelvic: Cervical exam performed  Dilation: Fingertip Effacement (%): Thick Station: -3  Extremities: Edema: Trace  Fetal Status: Fetal Heart Rate (bpm): 158 Fundal Height: 38 cm Movement: Present Presentation: Vertex  No results found for this or any previous visit (from the past 24 hour(s)).  Assessment & Plan:  1) Low-risk pregnancy [id number] at [redacted]w[redacted]d with an Estimated Date of Delivery: 07/04/21   2) Late to care, has been regularly since ~25wks  3) Anemia  4) Plan for visit and NST @ 40.2wks, postdates IOL prn   Meds: No  orders of the defined types were placed in this encounter.  Labs/procedures today: SVE  Plan:  Continue routine obstetrical care    Reviewed: Term labor symptoms and general obstetric precautions including but not limited to vaginal bleeding, contractions, leaking of fluid and fetal movement were reviewed in detail with the patient.  All questions were answered. Doesn't have home bp cuff. Check bp weekly, let us know if >140/90.   Follow-up: Return for As scheduled.  No orders of the defined types were placed in this encounter.  Arabella Merles CNM 06/29/2021 12:39 PM

## 2021-07-03 ENCOUNTER — Other Ambulatory Visit: Payer: Self-pay

## 2021-07-03 ENCOUNTER — Inpatient Hospital Stay (HOSPITAL_COMMUNITY): Payer: Medicaid Other | Admitting: Anesthesiology

## 2021-07-03 ENCOUNTER — Inpatient Hospital Stay (HOSPITAL_COMMUNITY)
Admission: AD | Admit: 2021-07-03 | Discharge: 2021-07-05 | DRG: 807 | Disposition: A | Discharge status: Home / Self Care | Payer: Medicaid Other | Attending: Obstetrics & Gynecology | Admitting: Obstetrics & Gynecology

## 2021-07-03 ENCOUNTER — Encounter (HOSPITAL_COMMUNITY): Payer: Self-pay | Admitting: Obstetrics and Gynecology

## 2021-07-03 DIAGNOSIS — J45909 Unspecified asthma, uncomplicated: Secondary | ICD-10-CM | POA: Diagnosis not present

## 2021-07-03 DIAGNOSIS — O99334 Smoking (tobacco) complicating childbirth: Secondary | ICD-10-CM | POA: Diagnosis not present

## 2021-07-03 DIAGNOSIS — Z3A39 39 weeks gestation of pregnancy: Secondary | ICD-10-CM | POA: Diagnosis not present

## 2021-07-03 DIAGNOSIS — O9902 Anemia complicating childbirth: Secondary | ICD-10-CM | POA: Diagnosis not present

## 2021-07-03 DIAGNOSIS — F17210 Nicotine dependence, cigarettes, uncomplicated: Secondary | ICD-10-CM | POA: Diagnosis not present

## 2021-07-03 DIAGNOSIS — O9952 Diseases of the respiratory system complicating childbirth: Secondary | ICD-10-CM | POA: Diagnosis not present

## 2021-07-03 DIAGNOSIS — O99214 Obesity complicating childbirth: Principal | ICD-10-CM | POA: Diagnosis present

## 2021-07-03 DIAGNOSIS — O26893 Other specified pregnancy related conditions, third trimester: Secondary | ICD-10-CM | POA: Diagnosis not present

## 2021-07-03 DIAGNOSIS — Z3A40 40 weeks gestation of pregnancy: Secondary | ICD-10-CM | POA: Diagnosis not present

## 2021-07-03 LAB — TYPE AND SCREEN
ABO/RH(D): B POS
Antibody Screen: NEGATIVE

## 2021-07-03 LAB — CBC
HCT: 28.6 % — ABNORMAL LOW (ref 36.0–46.0)
Hemoglobin: 9.7 g/dL — ABNORMAL LOW (ref 12.0–15.0)
MCH: 31.1 pg (ref 26.0–34.0)
MCHC: 33.9 g/dL (ref 30.0–36.0)
MCV: 91.7 fL (ref 80.0–100.0)
Platelets: 297 10*3/uL (ref 150–400)
RBC: 3.12 MIL/uL — ABNORMAL LOW (ref 3.87–5.11)
RDW: 13 % (ref 11.5–15.5)
WBC: 11.7 10*3/uL — ABNORMAL HIGH (ref 4.0–10.5)
nRBC: 0 % (ref 0.0–0.2)

## 2021-07-03 MED ORDER — OXYTOCIN-SODIUM CHLORIDE 30-0.9 UT/500ML-% IV SOLN
2.5000 [IU]/h | INTRAVENOUS | Status: DC
  Filled 2021-07-03: qty 500

## 2021-07-03 MED ORDER — LIDOCAINE HCL (PF) 1 % IJ SOLN
INTRAMUSCULAR | Status: DC | PRN
  Administered 2021-07-03: 4 mL via EPIDURAL
  Administered 2021-07-03: 5 mL via EPIDURAL

## 2021-07-03 MED ORDER — OXYCODONE-ACETAMINOPHEN 5-325 MG PO TABS: 2.0000 | ORAL_TABLET | ORAL | Status: DC | PRN

## 2021-07-03 MED ORDER — LACTATED RINGERS IV SOLN: 500.0000 mL | Freq: Once | INTRAVENOUS | Status: DC

## 2021-07-03 MED ORDER — PHENYLEPHRINE 80 MCG/ML (10ML) SYRINGE FOR IV PUSH (FOR BLOOD PRESSURE SUPPORT)
80.0000 ug | PREFILLED_SYRINGE | INTRAVENOUS | Status: DC | PRN
Start: 2021-07-03 — End: 2021-07-04

## 2021-07-03 MED ORDER — ACETAMINOPHEN 325 MG PO TABS: 650.0000 mg | ORAL_TABLET | ORAL | Status: DC | PRN

## 2021-07-03 MED ORDER — LACTATED RINGERS IV SOLN: 500.0000 mL | INTRAVENOUS | Status: DC | PRN

## 2021-07-03 MED ORDER — FENTANYL-BUPIVACAINE-NACL 0.5-0.125-0.9 MG/250ML-% EP SOLN
12.0000 mL/h | EPIDURAL | Status: DC | PRN
  Administered 2021-07-03: 12 mL/h via EPIDURAL
  Filled 2021-07-03: qty 250

## 2021-07-03 MED ORDER — OXYCODONE-ACETAMINOPHEN 5-325 MG PO TABS: 1.0000 | ORAL_TABLET | ORAL | Status: DC | PRN

## 2021-07-03 MED ORDER — DIPHENHYDRAMINE HCL 50 MG/ML IJ SOLN: 12.5000 mg | INTRAMUSCULAR | Status: DC | PRN

## 2021-07-03 MED ORDER — SOD CITRATE-CITRIC ACID 500-334 MG/5ML PO SOLN
30.0000 mL | ORAL | Status: DC | PRN
  Administered 2021-07-03: 30 mL via ORAL
  Filled 2021-07-03: qty 30

## 2021-07-03 MED ORDER — EPHEDRINE 5 MG/ML INJ
10.0000 mg | INTRAVENOUS | Status: DC | PRN
Start: 2021-07-03 — End: 2021-07-04

## 2021-07-03 MED ORDER — ONDANSETRON HCL 4 MG/2ML IJ SOLN: 4.0000 mg | Freq: Four times a day (QID) | INTRAMUSCULAR | Status: DC | PRN

## 2021-07-03 MED ORDER — OXYTOCIN BOLUS FROM INFUSION
333.0000 mL | Freq: Once | INTRAVENOUS | Status: AC
  Administered 2021-07-04: 333 mL via INTRAVENOUS

## 2021-07-03 MED ORDER — LIDOCAINE HCL (PF) 1 % IJ SOLN: 30.0000 mL | INTRAMUSCULAR | Status: DC | PRN

## 2021-07-03 MED ORDER — LACTATED RINGERS IV SOLN: INTRAVENOUS | Status: DC

## 2021-07-03 MED ORDER — PHENYLEPHRINE 80 MCG/ML (10ML) SYRINGE FOR IV PUSH (FOR BLOOD PRESSURE SUPPORT)
80.0000 ug | PREFILLED_SYRINGE | INTRAVENOUS | Status: DC | PRN
Start: 2021-07-03 — End: 2021-07-04
  Filled 2021-07-03: qty 10

## 2021-07-03 NOTE — Anesthesia Preprocedure Evaluation (Signed)
Anesthesia Evaluation  Patient identified by MRN, date of birth, ID band Patient awake    Reviewed: Allergy & Precautions, NPO status , Patient's Chart, lab work & pertinent test results  History of Anesthesia Complications Negative for: history of anesthetic complications  Airway Mallampati: II   Neck ROM: Full    Dental   Pulmonary asthma , Current Smoker,    Pulmonary exam normal        Cardiovascular negative cardio ROS Normal cardiovascular exam     Neuro/Psych PSYCHIATRIC DISORDERS Anxiety Depression negative neurological ROS     GI/Hepatic Neg liver ROS, GERD  Medicated and Controlled,  Endo/Other   Obesity    Renal/GU negative Renal ROS     Musculoskeletal negative musculoskeletal ROS (+)   Abdominal   Peds  Hematology negative hematology ROS (+)   Anesthesia Other Findings   Reproductive/Obstetrics                             Anesthesia Physical Anesthesia Plan  ASA: 2  Anesthesia Plan: Epidural   Post-op Pain Management:    Induction:   PONV Risk Score and Plan: 2 and Treatment may vary due to age or medical condition  Airway Management Planned: Natural Airway  Additional Equipment: None  Intra-op Plan:   Post-operative Plan:   Informed Consent: I have reviewed the patients History and Physical, chart, labs and discussed the procedure including the risks, benefits and alternatives for the proposed anesthesia with the patient or authorized representative who has indicated his/her understanding and acceptance.       Plan Discussed with: Anesthesiologist  Anesthesia Plan Comments: (Labs reviewed. Platelets acceptable, patient not taking any blood thinning medications. Per RN, FHR tracing reported to be stable enough for sitting procedure. Risks and benefits discussed with patient, including PDPH, backache, epidural hematoma, failed epidural, blood pressure  changes, allergic reaction, and nerve injury. Patient expressed understanding and wished to proceed.)        Anesthesia Quick Evaluation

## 2021-07-03 NOTE — Anesthesia Procedure Notes (Signed)
Epidural Patient location during procedure: OB Start time: 07/03/2021 9:48 PM End time: 07/03/2021 9:51 PM  Staffing Anesthesiologist: Beryle Lathe, MD Performed: anesthesiologist   Preanesthetic Checklist Completed: patient identified, IV checked, risks and benefits discussed, monitors and equipment checked, pre-op evaluation and timeout performed  Epidural Patient position: sitting Prep: DuraPrep Patient monitoring: continuous pulse ox and blood pressure Approach: midline Location: L2-L3 Injection technique: LOR saline  Needle:  Needle type: Tuohy  Needle gauge: 17 G Needle length: 9 cm Needle insertion depth: 5 cm Catheter size: 19 Gauge Catheter at skin depth: 10 cm Test dose: negative and Other (1% lidocaine)  Assessment Events: blood not aspirated  Additional Notes Patient identified. Risks including, but not limited to, bleeding, infection, nerve damage, paralysis, inadequate analgesia, blood pressure changes, nausea, vomiting, allergic reaction, postpartum back pain, itching, and headache were discussed. Patient expressed understanding and wished to proceed. Sterile prep and drape, including hand hygiene, mask, and sterile gloves were used. The patient was positioned and the spine was prepped. The skin was anesthetized with lidocaine. No paraesthesia or other complication noted. The patient did not experience any signs of intravascular injection such as tinnitus or metallic taste in mouth, nor signs of intrathecal spread such as rapid motor block. Please see nursing notes for vital signs. The patient tolerated the procedure well.   Leslye Peer, MDReason for block:procedure for pain

## 2021-07-03 NOTE — H&P (Signed)
[name] is a 36 y.o. female [id number] with IUP at 7670w6d by  presenting for SOL.  She reports positive fetal movement. She denies leakage of fluid or vaginal bleeding.  Prenatal History/Complications: PNC at [facility]; late to care (started care at 25 weeks).  Pregnancy complications:  - Past Medical History: Past Medical History:  Diagnosis Date   Abortion    Anxiety    Asthma    Back pain 11/18/2012   History of abnormal cervical Pap smear 01/13/2014   History of bronchitis    History of chlamydia    History of gonorrhea    History of trichomoniasis    Nausea and vomiting during pregnancy 06/11/2015   Urge incontinence 01/13/2014   Vaginal Pap smear, abnormal     Past Surgical History: Past Surgical History:  Procedure Laterality Date   ELECTIVE ABORTION     MOUTH SURGERY      Obstetrical History: OB History     Gravida  8   Para  2   Term  2   Preterm      AB  5   Living  2      SAB  2   IAB  3   Ectopic      Multiple  0   Live Births  2            Social History: Social History   Socioeconomic History   Marital status: Single    Spouse name: Not on file   Number of children: 2   Years of education: Not on file   Highest education level: Not on file  Occupational History   Not on file  Tobacco Use   Smoking status: Every Day    Packs/day: 0.50    Years: 10.00    Total pack years: 5.00    Types: Cigarettes   Smokeless tobacco: Never  Vaping Use   Vaping Use: Never used  Substance and Sexual Activity   Alcohol use: No    Alcohol/week: 0.0 standard drinks of alcohol    Comment: occ   Drug use: No    Comment: Per patient exposed to second hand Marijuana smoke   Sexual activity: Yes    Birth control/protection: None  Other Topics Concern   Not on file  Social History Narrative   Not on file   Social Determinants of Health   Financial Resource Strain: Low Risk  (04/07/2021)   Overall Financial Resource Strain (CARDIA)     Difficulty of Paying Living Expenses: Not hard at all  Food Insecurity: No Food Insecurity (04/07/2021)   Hunger Vital Sign    Worried About Running Out of Food in the Last Year: Never true    Ran Out of Food in the Last Year: Never true  Transportation Needs: No Transportation Needs (04/07/2021)   PRAPARE - Administrator, Civil ServiceTransportation    Lack of Transportation (Medical): No    Lack of Transportation (Non-Medical): No  Recent Concern: Transportation Needs - Unmet Transportation Needs (03/16/2021)   PRAPARE - Transportation    Lack of Transportation (Medical): Yes    Lack of Transportation (Non-Medical): Patient refused  Physical Activity: Sufficiently Active (04/07/2021)   Exercise Vital Sign    Days of Exercise per Week: 7 days    Minutes of Exercise per Session: 60 min  Stress: Stress Concern Present (04/07/2021)   [employer] - Occupational Stress Questionnaire    Feeling of Stress : To some extent  Social Connections: Moderately Isolated (  04/07/2021)   Social Connection and Isolation Panel [NHANES]    Frequency of Communication with Friends and Family: More than three times a week    Frequency of Social Gatherings with Friends and Family: More than three times a week    Attends Religious Services: More than 4 times per year    Active Member of [employer] or Organizations: No    Attends Engineer, structural: Never    Marital Status: Never married    Family History: Family History  Problem Relation Age of Onset   Cancer Maternal Aunt        breast   Diabetes Maternal Grandmother    Hypertension Maternal Grandmother    Hypertension Maternal Grandfather    Schizophrenia Brother    Seizures Brother    Asthma Son    Fibroids Mother        fibroid tumor in uterus    Allergies: Allergies  Allergen Reactions   Strawberry Extract Hives   Latex Itching and Rash    Medications Prior to Admission  Medication Sig Dispense Refill Last Dose    diphenhydrAMINE HCl (BENADRYL PO) Take by mouth.   07/02/2021 at 2000   famotidine (PEPCID) 10 MG tablet Take by mouth.   Past Week   ferrous sulfate 325 (65 FE) MG tablet Take 1 tablet (325 mg total) by mouth every other day. 45 tablet 2 Past Week   Prenatal Vit-Fe Fumarate-FA (PREPLUS) 27-1 MG TABS Take 1 tablet by mouth daily. 30 tablet 11 Past Week   acetaminophen (TYLENOL) 500 MG tablet Take 1,000 mg by mouth every 8 (eight) hours as needed for mild pain or headache.    More than a month   Blood Pressure Monitor MISC For regular home bp monitoring during pregnancy 1 each 0    famotidine (PEPCID) 20 MG tablet Take 1 tablet (20 mg total) by mouth 2 (two) times daily as needed for heartburn or indigestion. (Patient not taking: Reported on 04/28/2021) 60 tablet 3    sertraline (ZOLOFT) 25 MG tablet Take 1 tablet (25 mg total) by mouth daily. (Patient not taking: Reported on 06/24/2021) 30 tablet 6 More than a month    Review of Systems   Constitutional: Negative for fever and chills Eyes: Negative for visual disturbances Respiratory: Negative for shortness of breath, dyspnea Cardiovascular: Negative for chest pain or palpitations  Gastrointestinal: Negative for vomiting, diarrhea and constipation.  POSITIVE for abdominal pain (contractions) Genitourinary: Negative for dysuria and urgency Musculoskeletal: Negative for back pain, joint pain, myalgias  Neurological: Negative for dizziness and headaches  Blood pressure 107/79, pulse (!) 117, temperature 98.5 F (36.9 C), temperature source Oral, resp. rate 17, SpO2 100 %. General appearance: alert, cooperative, and no distress Lungs: normal respiratory effort Heart: regular rate and rhythm Abdomen: soft, non-tender; bowel sounds normal Extremities: Homans sign is negative, no sign of DVT DTR's 2+ Presentation: cephalic by RN in MAU Fetal monitoring  Baseline: 150 bpm mod var, present acel, no decels, no contractions Uterine activity   q2-4 Dilation: 4.5 Effacement (%): 80, 70 Station: -2 Exam by:: Mellissa Kohut, RN   Prenatal labs: ABO, Rh: --/--/B POS (06/11 2045) Antibody: NEG (06/11 2045) Rubella:   RPR: Non Reactive (03/16 0943)  HBsAg:    HIV: Non Reactive (03/16 0943)  GBS: Negative/-- (06/02 1330)  1 hr Glucola passed Genetic screening  not done Anatomy US Normal  Prenatal Transfer Tool  Maternal Diabetes: No Genetic Screening: Declined-not done Maternal Ultrasounds/Referrals: Normal Fetal Ultrasounds or  other Referrals:  None Maternal Substance Abuse:  No Significant Maternal Medications:  None Significant Maternal Lab Results: Group B Strep negative  Results for orders placed or performed during the hospital encounter of 07/03/21 (from the past 24 hour(s))  Type and screen [facility]   Collection Time: 07/03/21  8:45 PM  Result Value Ref Range   ABO/RH(D) B POS    Antibody Screen NEG    Sample Expiration      07/06/2021,2359 Performed at [facility], [address], [place], [place] [address]   CBC   Collection Time: 07/03/21  8:56 PM  Result Value Ref Range   WBC 11.7 (H) 4.0 - 10.5 K/uL   RBC 3.12 (L) 3.87 - 5.11 MIL/uL   Hemoglobin 9.7 (L) 12.0 - 15.0 g/dL   HCT 37.6 (L) 28.3 - 15.1 %   MCV 91.7 80.0 - 100.0 fL   MCH 31.1 26.0 - 34.0 pg   MCHC 33.9 30.0 - 36.0 g/dL   RDW 76.1 60.7 - 37.1 %   Platelets 297 150 - 400 K/uL   nRBC 0.0 0.0 - 0.2 %    Assessment: [name] is a 36 y.o. [phone] with an IUP at [redacted]w[redacted]d presenting for SOL  Plan: #Labor: expectant management #Pain:  Per request #FWB Cat 1 #ID: GBS: neg #MOF:  bottle #MOC: unsure-does not want hormonal method #Circ: NA female   Marylene Land 07/03/2021, 10:50 PM

## 2021-07-03 NOTE — MAU Note (Signed)
Pt reports to mau with c/o ctx q 5-10 min but states they have now spaced out.  Denies LOF.  +FM

## 2021-07-04 ENCOUNTER — Encounter (HOSPITAL_COMMUNITY): Payer: Self-pay | Admitting: Student

## 2021-07-04 ENCOUNTER — Other Ambulatory Visit: Payer: Self-pay | Admitting: Student

## 2021-07-04 DIAGNOSIS — Z3A39 39 weeks gestation of pregnancy: Secondary | ICD-10-CM

## 2021-07-04 LAB — HEPATITIS B SURFACE ANTIGEN: Hepatitis B Surface Ag: NONREACTIVE

## 2021-07-04 LAB — RPR: RPR Ser Ql: NONREACTIVE

## 2021-07-04 MED ORDER — ONDANSETRON HCL 4 MG PO TABS: 4.0000 mg | ORAL_TABLET | ORAL | Status: DC | PRN

## 2021-07-04 MED ORDER — BENZOCAINE-MENTHOL 20-0.5 % EX AERO: 1.0000 "application " | INHALATION_SPRAY | CUTANEOUS | Status: DC | PRN

## 2021-07-04 MED ORDER — ONDANSETRON HCL 4 MG/2ML IJ SOLN: 4.0000 mg | INTRAMUSCULAR | Status: DC | PRN

## 2021-07-04 MED ORDER — DIBUCAINE (PERIANAL) 1 % EX OINT: 1.0000 "application " | TOPICAL_OINTMENT | CUTANEOUS | Status: DC | PRN

## 2021-07-04 MED ORDER — OXYCODONE HCL 5 MG PO TABS
5.0000 mg | ORAL_TABLET | Freq: Once | ORAL | Status: AC
  Administered 2021-07-04: 5 mg via ORAL
  Filled 2021-07-04: qty 1

## 2021-07-04 MED ORDER — DIPHENHYDRAMINE HCL 25 MG PO CAPS: 25.0000 mg | ORAL_CAPSULE | Freq: Four times a day (QID) | ORAL | Status: DC | PRN

## 2021-07-04 MED ORDER — ACETAMINOPHEN 325 MG PO TABS
650.0000 mg | ORAL_TABLET | ORAL | Status: DC | PRN
  Administered 2021-07-04 – 2021-07-05 (×5): 650 mg via ORAL
  Filled 2021-07-04 (×6): qty 2

## 2021-07-04 MED ORDER — SIMETHICONE 80 MG PO CHEW: 80.0000 mg | CHEWABLE_TABLET | ORAL | Status: DC | PRN

## 2021-07-04 MED ORDER — PRENATAL MULTIVITAMIN CH
1.0000 | ORAL_TABLET | Freq: Every day | ORAL | Status: DC
  Administered 2021-07-04 – 2021-07-05 (×2): 1 via ORAL
  Filled 2021-07-04 (×2): qty 1

## 2021-07-04 MED ORDER — TETANUS-DIPHTH-ACELL PERTUSSIS 5-2.5-18.5 LF-MCG/0.5 IM SUSY: 0.5000 mL | PREFILLED_SYRINGE | Freq: Once | INTRAMUSCULAR | Status: DC

## 2021-07-04 MED ORDER — IBUPROFEN 600 MG PO TABS
600.0000 mg | ORAL_TABLET | Freq: Four times a day (QID) | ORAL | Status: DC
  Administered 2021-07-04 – 2021-07-05 (×3): 600 mg via ORAL
  Filled 2021-07-04 (×4): qty 1

## 2021-07-04 MED ORDER — FAMOTIDINE 20 MG PO TABS
20.0000 mg | ORAL_TABLET | Freq: Two times a day (BID) | ORAL | Status: DC
  Administered 2021-07-04 – 2021-07-05 (×3): 20 mg via ORAL
  Filled 2021-07-04 (×3): qty 1

## 2021-07-04 MED ORDER — COCONUT OIL OIL: 1.0000 "application " | TOPICAL_OIL | Status: DC | PRN

## 2021-07-04 MED ORDER — WITCH HAZEL-GLYCERIN EX PADS: 1.0000 "application " | MEDICATED_PAD | CUTANEOUS | Status: DC | PRN

## 2021-07-04 MED ORDER — SENNOSIDES-DOCUSATE SODIUM 8.6-50 MG PO TABS
2.0000 | ORAL_TABLET | Freq: Every day | ORAL | Status: DC
  Administered 2021-07-05: 2 via ORAL
  Filled 2021-07-04: qty 2

## 2021-07-04 MED ORDER — ZOLPIDEM TARTRATE 5 MG PO TABS: 5.0000 mg | ORAL_TABLET | Freq: Every evening | ORAL | Status: DC | PRN

## 2021-07-04 NOTE — Progress Notes (Signed)
Patient ID: [name], female   DOB: 06-Aug-1985, 36 y.o.   MRN: [id number]   [name] is a 36 y.o. [id number] at [redacted]w[redacted]d  admitted for SOL  Subjective:  Doing well, feeling some pressure with contractions but no urge to push yet.   Objective: Vitals:   07/04/21 0137 07/04/21 0203 07/04/21 0230 07/04/21 0300  BP:  108/63 107/61 97/60  Pulse:  76 79 85  Resp:      Temp: 97.6 F (36.4 C)     TempSrc: Oral     SpO2:       No intake/output data recorded.  FHT:  FHR: 130 bpm, variability: moderate,  accelerations:  Present,  decelerations:  Absent UC:   irregular, every 1-3 minutes SVE:   Dilation: Lip/rim Effacement (%): 80 Station: Plus 1 Exam by:: katherine, CNM Pitocin @ 0   Labs: Lab Results  Component Value Date   WBC 11.7 (H) 07/03/2021   HGB 9.7 (L) 07/03/2021   HCT 28.6 (L) 07/03/2021   MCV 91.7 07/03/2021   PLT 297 07/03/2021    Assessment / Plan: Spontaneous labor, progressing normally Laboring down for now Labor: Progressing normally Fetal Wellbeing:  Category I Pain Control:  Epidural Anticipated MOD:  NSVD  Charlesetta Garibaldi [name] 07/04/2021, 3:22 AM

## 2021-07-04 NOTE — Discharge Summary (Addendum)
Postpartum Discharge Summary      Patient Name: [name] DOB: 10/15/1985 MRN: [id number]  Date of admission: 07/03/2021 Delivery date:07/04/2021  Delivering provider: Starr Lake  Date of discharge: 07/05/2021  Admitting diagnosis: Indication for care or intervention in labor or delivery [O75.9] Intrauterine pregnancy: [redacted]w[redacted]d    Secondary diagnosis:  Principal Problem:   Indication for care or intervention in labor or delivery  Additional problems: anemia of pregnancy    Discharge diagnosis: Term Pregnancy Delivered                                              Post partum procedures: NA Augmentation: AROM Complications: None  Hospital course: Onset of Labor With Vaginal Delivery      36y.o. yo [id number] 49w0das admitted in Active Labor on 07/03/2021. Patient had an uncomplicated labor course as follows:  Membrane Rupture Time/Date: 1:23 AM ,07/04/2021   Delivery Method:Vaginal, Spontaneous  Episiotomy: None  Lacerations:  None  Patient had an uncomplicated postpartum course.  She is ambulating, tolerating a regular diet, passing flatus, and urinating well. Patient is discharged home in stable condition on 07/05/21.  Newborn Data: Birth date:07/04/2021  Birth time:4:41 AM  Gender:Female  Living status:Living  Apgars:8 ,  Weight:3030 g   Magnesium Sulfate received: No BMZ received: No Rhophylac:No MMR: T-DaP: Flu:  Transfusion:No  Physical exam  Vitals:   07/04/21 1137 07/04/21 1700 07/04/21 2156 07/05/21 0605  BP: 115/71 118/69 127/70 119/71  Pulse: 85 80 86 76  Resp: _0 Temp: 98 F (36.7 C) 98 F (36.7 C) 98.1 F (36.7 C) 98 F (36.7 C)  TempSrc:   Oral Oral  SpO2:  99% 100% 100%   General: alert, cooperative, and no distress Lochia: appropriate Uterine Fundus: firm Incision: N/A DVT Evaluation: No evidence of DVT seen on physical exam. Negative Homan's sign. No cords or calf tenderness. No significant  calf/ankle edema. Labs: Lab Results  Component Value Date   WBC 12.1 (H) 07/05/2021   HGB 8.4 (L) 07/05/2021   HCT 25.5 (L) 07/05/2021   MCV 91.7 07/05/2021   PLT 251 07/05/2021      Latest Ref Rng & Units 10/12/2020   12:37 AM  CMP  Glucose 70 - 99 mg/dL 95   BUN 6 - 20 mg/dL 7   Creatinine 0.44 - 1.00 mg/dL 0.54   Sodium 135 - 145 mmol/L 137   Potassium 3.5 - 5.1 mmol/L 3.5   Chloride 98 - 111 mmol/L 107   CO2 22 - 32 mmol/L 28   Calcium 8.9 - 10.3 mg/dL 8.1   Total Protein 6.5 - 8.1 g/dL 7.4   Total Bilirubin 0.3 - 1.2 mg/dL 0.4   Alkaline Phos 38 - 126 U/L 62   AST 15 - 41 U/L 52   ALT 0 - 44 U/L 42    Edinburgh Score:    07/04/2021    9:56 PM  Edinburgh Postnatal Depression Scale Screening Tool  I have been able to laugh and see the funny side of things. 0  I have looked forward with enjoyment to things. 0  I have blamed myself unnecessarily when things went wrong. 1  I have been anxious or worried for no good reason. 3  I have felt scared or panicky for no good reason. 3  Things have been getting on top of me. 0  I have been so unhappy that I have had difficulty sleeping. 0  I have felt sad or miserable. 0  I have been so unhappy that I have been crying. 0  The thought of harming myself has occurred to me. 0  Edinburgh Postnatal Depression Scale Total 7     After visit meds:  Allergies as of 07/05/2021       Reactions   Strawberry Extract Hives   Latex Itching, Rash        Medication List     STOP taking these medications    BENADRYL PO   Blood Pressure Monitor Misc   famotidine 10 MG tablet Commonly known as: PEPCID   famotidine 20 MG tablet Commonly known as: Pepcid       TAKE these medications    acetaminophen 325 MG tablet Commonly known as: Tylenol Take 2 tablets (650 mg total) by mouth every 4 (four) hours as needed (for pain scale < 4). What changed:  medication strength how much to take when to take this reasons to take  this   acetaminophen 325 MG tablet Commonly known as: TYLENOL Take 2 tablets (650 mg total) by mouth every 6 (six) hours as needed. What changed: You were already taking a medication with the same name, and this prescription was added. Make sure you understand how and when to take each.   ferrous sulfate 325 (65 FE) MG tablet Take 1 tablet (325 mg total) by mouth every other day.   ibuprofen 600 MG tablet Commonly known as: ADVIL Take 1 tablet (600 mg total) by mouth every 6 (six) hours.   PrePLUS 27-1 MG Tabs Take 1 tablet by mouth daily.   sertraline 25 MG tablet Commonly known as: ZOLOFT Take 1 tablet (25 mg total) by mouth daily.          Discharge home in stable condition Infant Feeding: Breast Infant Disposition:home with mother Discharge instruction: per After Visit Summary and Postpartum booklet. Activity: Advance as tolerated. Pelvic rest for 6 weeks.  Diet: routine diet Future Appointments: Future Appointments  Date Time Provider Department Center  08/01/2021  8:50 AM [name], Kimberly R, CNM [facility]   Follow up Visit:   Please schedule this patient for a In person postpartum visit in 4 weeks with the following provider: Any provider. Additional Postpartum F/U: NA   Low risk pregnancy complicated by:  Nothing Delivery mode:  Vaginal, Spontaneous  Anticipated Birth Control:  Unsure   07/05/2021  Cresenzo-Dishmon, CNM    

## 2021-07-04 NOTE — Progress Notes (Signed)
Plan of care told to patient. Patient told to call for help getting up to void. Patient wanted tylenol and pepcid however when I brought it in she was too tired to take it . I told her to call when she wanted meds. Patient seemed very tired.

## 2021-07-04 NOTE — Discharge Summary (Incomplete)
Postpartum Discharge Summary  Date of Service updated***     Patient Name: [name] DOB: 1985-12-17 MRN: [id number]  Date of admission: 07/03/2021 Delivery date:07/04/2021  Delivering provider: Starr Lake  Date of discharge: 07/04/2021  Admitting diagnosis: Indication for care or intervention in labor or delivery [O75.9] Intrauterine pregnancy: [redacted]w[redacted]d     Secondary diagnosis:  Principal Problem:   Indication for care or intervention in labor or delivery  Additional problems: ***    Discharge diagnosis: {DX.:23714}                                              Post partum procedures: NA Augmentation: AROM Complications: None  Hospital course: Onset of Labor With Vaginal Delivery      36 y.o. yo [id number] at [redacted]w[redacted]d was admitted in Active Labor on 07/03/2021. Patient had an uncomplicated labor course as follows:  Membrane Rupture Time/Date: 1:23 AM ,07/04/2021   Delivery Method:Vaginal, Spontaneous  Episiotomy: None  Lacerations:  None  Patient had an uncomplicated postpartum course.  She is ambulating, tolerating a regular diet, passing flatus, and urinating well. Patient is discharged home in stable condition on 07/04/21.  Newborn Data: Birth date:07/04/2021  Birth time:4:41 AM  Gender:Female  Living status:Living  Apgars:8 ,  Weight:3030 g   Magnesium Sulfate received: No BMZ received: No Rhophylac:No MMR: T-DaP: Flu:  Transfusion:{Transfusion received:30440034}  Physical exam  Vitals:   07/04/21 0518 07/04/21 0531 07/04/21 0545 07/04/21 0601  BP: 118/64 115/61 (!) 118/58 (!) 126/58  Pulse: 89 84 83 86  Resp:    18  Temp:      TempSrc:      SpO2:       General: {Exam; general:21111117} Lochia: {Desc; appropriate/inappropriate:30686::"appropriate"} Uterine Fundus: {Desc; firm/soft:30687} Incision: {Exam; incision:21111123} DVT Evaluation: {Exam; dvt:2111122} Labs: Lab Results  Component Value Date   WBC 11.7 (H) 07/03/2021   HGB  9.7 (L) 07/03/2021   HCT 28.6 (L) 07/03/2021   MCV 91.7 07/03/2021   PLT 297 07/03/2021      Latest Ref Rng & Units 10/12/2020   12:37 AM  CMP  Glucose 70 - 99 mg/dL 95   BUN 6 - 20 mg/dL 7   Creatinine 0.44 - 1.00 mg/dL 0.54   Sodium 135 - 145 mmol/L 137   Potassium 3.5 - 5.1 mmol/L 3.5   Chloride 98 - 111 mmol/L 107   CO2 22 - 32 mmol/L 28   Calcium 8.9 - 10.3 mg/dL 8.1   Total Protein 6.5 - 8.1 g/dL 7.4   Total Bilirubin 0.3 - 1.2 mg/dL 0.4   Alkaline Phos 38 - 126 U/L 62   AST 15 - 41 U/L 52   ALT 0 - 44 U/L 42    Edinburgh Score:    11/16/2017    1:09 PM  Edinburgh Postnatal Depression Scale Screening Tool  I have been able to laugh and see the funny side of things. 0  I have looked forward with enjoyment to things. 0  I have blamed myself unnecessarily when things went wrong. 0  I have been anxious or worried for no good reason. 2  I have felt scared or panicky for no good reason. 0  Things have been getting on top of me. 2  I have been so unhappy that I have had difficulty sleeping. 0  I have felt sad  or miserable. 0  I have been so unhappy that I have been crying. 0  The thought of harming myself has occurred to me. 0  Edinburgh Postnatal Depression Scale Total 4     After visit meds:  Allergies as of 07/04/2021       Reactions   Strawberry Extract Hives   Latex Itching, Rash     Med Rec must be completed prior to using this [facility]***        Discharge home in stable condition Infant Feeding: {Baby feeding:23562} Infant Disposition:{CHL IP OB HOME WITH HCOBTV:49971} Discharge instruction: per After Visit Summary and Postpartum booklet. Activity: Advance as tolerated. Pelvic rest for 6 weeks.  Diet: {OB KEUV:90689340} Future Appointments: Future Appointments  Date Time Provider [place]  07/06/2021  2:30 PM [facility] NURSE [facility]  07/06/2021  2:50 PM Janyth Pupa, DO [facility]   Follow up Visit:   Please schedule  this patient for a In person postpartum visit in 4 weeks with the following provider: Any provider. Additional Postpartum F/U: NA   Low risk pregnancy complicated by:  Nothing Delivery mode:  Vaginal, Spontaneous  Anticipated Birth Control:  Unsure   07/04/2021 Starr Lake, CNM

## 2021-07-04 NOTE — Anesthesia Postprocedure Evaluation (Signed)
Anesthesia Post Note  Patient: [name]  Procedure(s) Performed: AN AD HOC LABOR EPIDURAL     Patient location during evaluation: Mother Baby Anesthesia Type: Epidural Level of consciousness: awake Pain management: satisfactory to patient Vital Signs Assessment: post-procedure vital signs reviewed and stable Respiratory status: spontaneous breathing Cardiovascular status: stable Anesthetic complications: no   No notable events documented.  Last Vitals:  Vitals:   07/04/21 0630 07/04/21 0644  BP: 130/75 125/71  Pulse: 85 86  Resp:    Temp:  36.9 C  SpO2:  100%    Last Pain:  Vitals:   07/04/21 0500  TempSrc:   PainSc: 0-No pain   Pain Goal:                   [employer]

## 2021-07-04 NOTE — Progress Notes (Signed)
[name] called to be notified that the patient did not  have a current Hep B status, she was in procedure.

## 2021-07-05 LAB — CBC
HCT: 25.5 % — ABNORMAL LOW (ref 36.0–46.0)
Hemoglobin: 8.4 g/dL — ABNORMAL LOW (ref 12.0–15.0)
MCH: 30.2 pg (ref 26.0–34.0)
MCHC: 32.9 g/dL (ref 30.0–36.0)
MCV: 91.7 fL (ref 80.0–100.0)
Platelets: 251 10*3/uL (ref 150–400)
RBC: 2.78 MIL/uL — ABNORMAL LOW (ref 3.87–5.11)
RDW: 12.9 % (ref 11.5–15.5)
WBC: 12.1 10*3/uL — ABNORMAL HIGH (ref 4.0–10.5)
nRBC: 0 % (ref 0.0–0.2)

## 2021-07-05 LAB — RUBELLA SCREEN: Rubella: 5.4 index (ref >0.99)

## 2021-07-05 MED ORDER — ACETAMINOPHEN 325 MG PO TABS: 650.0000 mg | ORAL_TABLET | ORAL | Status: DC | PRN

## 2021-07-05 MED ORDER — OXYCODONE HCL 5 MG PO TABS
5.0000 mg | ORAL_TABLET | Freq: Once | ORAL | Status: AC
  Administered 2021-07-05: 5 mg via ORAL
  Filled 2021-07-05: qty 1

## 2021-07-05 MED ORDER — ACETAMINOPHEN 500 MG PO TABS
1000.0000 mg | ORAL_TABLET | Freq: Once | ORAL | Status: AC
  Filled 2021-07-05: qty 2

## 2021-07-05 MED ORDER — ACETAMINOPHEN 325 MG PO TABS: 650.0000 mg | ORAL_TABLET | Freq: Four times a day (QID) | ORAL | 0 refills | Status: DC | PRN

## 2021-07-05 MED ORDER — IBUPROFEN 600 MG PO TABS: 600.0000 mg | ORAL_TABLET | Freq: Four times a day (QID) | ORAL | 0 refills | Status: DC

## 2021-07-05 MED ORDER — ACETAMINOPHEN 500 MG PO TABS
ORAL_TABLET | ORAL | Status: AC
  Administered 2021-07-05: 1000 mg via ORAL
  Filled 2021-07-05: qty 2
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
## 2021-07-06 ENCOUNTER — Other Ambulatory Visit: Payer: Medicaid Other | Admitting: Obstetrics & Gynecology

## 2021-07-06 ENCOUNTER — Other Ambulatory Visit: Payer: Medicaid Other

## 2021-07-13 ENCOUNTER — Telehealth (HOSPITAL_COMMUNITY): Payer: Self-pay | Admitting: *Deleted

## 2021-07-13 NOTE — Telephone Encounter (Signed)
Voicemail not setup. Unable to leave message.  Duffy Rhody, RN 07-13-2021 at 9:52am

## 2021-08-01 ENCOUNTER — Ambulatory Visit: Payer: Medicaid Other | Admitting: Women's Health
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
## 2022-01-11 ENCOUNTER — Ambulatory Visit: Payer: Medicaid Other | Admitting: Adult Health

## 2022-01-24 ENCOUNTER — Ambulatory Visit: Payer: Medicaid Other | Admitting: Obstetrics & Gynecology

## 2022-01-25 ENCOUNTER — Ambulatory Visit: Payer: Medicaid Other | Admitting: Family Medicine

## 2022-01-30 ENCOUNTER — Ambulatory Visit (INDEPENDENT_AMBULATORY_CARE_PROVIDER_SITE_OTHER): Payer: Medicaid Other | Admitting: Women's Health

## 2022-01-30 ENCOUNTER — Ambulatory Visit: Payer: Medicaid Other | Admitting: Women's Health

## 2022-01-30 ENCOUNTER — Other Ambulatory Visit (HOSPITAL_COMMUNITY)
Admission: RE | Admit: 2022-01-30 | Discharge: 2022-01-30 | Disposition: A | Discharge status: Home / Self Care | Payer: Medicaid Other | Source: Ambulatory Visit | Attending: Women's Health | Admitting: Women's Health

## 2022-01-30 ENCOUNTER — Encounter: Payer: Self-pay | Admitting: Women's Health

## 2022-01-30 VITALS — BP 147/87 | HR 99

## 2022-01-30 DIAGNOSIS — N926 Irregular menstruation, unspecified: Secondary | ICD-10-CM | POA: Diagnosis not present

## 2022-01-30 DIAGNOSIS — F419 Anxiety disorder, unspecified: Secondary | ICD-10-CM

## 2022-01-30 DIAGNOSIS — Z3202 Encounter for pregnancy test, result negative: Secondary | ICD-10-CM

## 2022-01-30 DIAGNOSIS — R030 Elevated blood-pressure reading, without diagnosis of hypertension: Secondary | ICD-10-CM | POA: Diagnosis not present

## 2022-01-30 LAB — POCT URINE PREGNANCY: Preg Test, Ur: NEGATIVE

## 2022-01-30 NOTE — Progress Notes (Signed)
Work-in GYN VISIT Patient name: [name] MRN [id number]  Date of birth: [birth year] Chief Complaint:   had a long period in December  History of Present Illness:   [name] is a 37 y.o. [phone] African-American female being seen today as a work-in for prolonged period. 12/7-12/30, was heavier than normal as well, reports changing pad q1hr, bad cramps. Have been normal since birth of her child in June. Family h/o fibroids. None noted during her OB u/s in 2023. Bottlefeeding. BP high, no h/o HTN, reports a lot of anxiety. Using condoms, wants to stick w/ this method.     Patient's last menstrual period was 12/29/2021. The current method of family planning is condoms.  Last pap 03/16/21. Results were: NILM w/ HRHPV negative     04/07/2021   12:17 PM 03/16/2021    3:19 PM 03/07/2017    3:55 PM 02/06/2017    4:07 PM  Depression screen PHQ 2/9  Decreased Interest 0 0 1 0  Down, Depressed, Hopeless 0 0 0 1  PHQ - 2 Score 0 0 1 1  Altered sleeping 0 0 3 2  Tired, decreased energy 0 0 3 1  Change in appetite 0 0 1 1  Feeling bad or failure about yourself  0 0 0 0  Trouble concentrating 0 0 0 0  Moving slowly or fidgety/restless 0 0 0 0  Suicidal thoughts 0 0 0 0  PHQ-9 Score 0 0 8 5  Difficult doing work/chores   Not difficult at all Not difficult at all        04/07/2021   12:18 PM 03/16/2021    3:19 PM  GAD 7 : Generalized Anxiety Score  Nervous, Anxious, on Edge 0 3  Control/stop worrying 3 3  Worry too much - different things 3 3  Trouble relaxing 0 0  Restless 0 0  Easily annoyed or irritable 0 0  Afraid - awful might happen 0 0  Total GAD 7 Score 6 9     Review of Systems:   Pertinent items are noted in HPI Denies fever/chills, dizziness, headaches, visual disturbances, fatigue, shortness of breath, chest pain, abdominal pain, vomiting, abnormal vaginal discharge/itching/odor/irritation, problems with periods, bowel movements, urination, or intercourse  unless otherwise stated above.  Pertinent History Reviewed:  Reviewed past medical,surgical, social, obstetrical and family history.  Reviewed problem list, medications and allergies. Physical Assessment:   Vitals:   01/30/22 1625 01/30/22 1631  BP: (!) 166/91 (!) 147/87  Pulse: (!) 110 99  There is no height or weight on file to calculate BMI.       Physical Examination:   General appearance: alert, well appearing, and in no distress  Mental status: alert, oriented to person, place, and time  Skin: warm & dry   Cardiovascular: normal heart rate noted  Respiratory: normal respiratory effort, no distress  Abdomen: soft, non-tender   Pelvic: VULVA: normal appearing vulva with no masses, tenderness or lesions, VAGINA: normal appearing vagina with normal color and discharge, no lesions, CERVIX: normal appearing cervix without discharge or lesions, UTERUS: uterus is normal size, shape, consistency and nontender, ADNEXA: normal adnexa in size, nontender and no masses  Extremities: no edema   Chaperone: N/A    Results for orders placed or performed in visit on 01/30/22 (from the past 24 hour(s))  POCT urine pregnancy   Collection Time: 01/30/22  4:30 PM  Result Value Ref Range   Preg Test, Ur Negative Negative  Assessment & Plan:  1) Prolonged heavy period x 1> CV swab sent, if neg can wait to see if happens again- if so, will get pelvic u/s. Declines contraception from [place] at this time, ok w/ using condoms.   2) Elevated bp> no h/o HTN, states she has a lot of anxiety lately and thinks it may be up d/t this. Discussed f/u w/ PCP, would rather f/u w/ [place]. To return in 1wk for bp check and make sure she was able to establish care w/ [facility]  3) Anxiety> Has tried multiple meds in past, none that worked well. Discussed [facility], wants to try, card given to scan QR code and establish mental health care w/ them.   Meds: No orders of the defined types were placed in this  encounter.   Orders Placed This Encounter  Procedures   POCT urine pregnancy    Return for next week bp & mood check w/ any provider.  [place], [facility] 01/30/2022 5:16 PM

## 2022-02-01 LAB — CERVICOVAGINAL ANCILLARY ONLY
Bacterial Vaginitis (gardnerella): POSITIVE — AB
Candida Glabrata: NEGATIVE
Candida Vaginitis: NEGATIVE
Chlamydia: NEGATIVE
Comment: NEGATIVE
Comment: NEGATIVE
Comment: NEGATIVE
Comment: NEGATIVE
Comment: NEGATIVE
Comment: NORMAL
Neisseria Gonorrhea: NEGATIVE
Trichomonas: POSITIVE — AB

## 2022-02-06 ENCOUNTER — Ambulatory Visit: Payer: Medicaid Other | Admitting: Adult Health

## 2022-02-06 ENCOUNTER — Encounter: Payer: Self-pay | Admitting: Women's Health

## 2022-02-06 DIAGNOSIS — A599 Trichomoniasis, unspecified: Secondary | ICD-10-CM | POA: Insufficient documentation

## 2022-02-06 MED ORDER — METRONIDAZOLE 500 MG PO TABS: 500.0000 mg | ORAL_TABLET | Freq: Two times a day (BID) | ORAL | 0 refills | Status: DC

## 2022-02-06 NOTE — Addendum Note (Signed)
Addended by: Wells Guiles R on: 02/06/2022 01:14 PM   Modules accepted: Orders

## 2022-03-23 ENCOUNTER — Encounter: Payer: Self-pay | Admitting: Radiology

## 2022-12-25 DIAGNOSIS — J020 Streptococcal pharyngitis: Secondary | ICD-10-CM | POA: Diagnosis not present

## 2023-01-04 ENCOUNTER — Other Ambulatory Visit: Payer: Self-pay

## 2023-01-04 ENCOUNTER — Emergency Department (HOSPITAL_COMMUNITY)
Admission: EM | Admit: 2023-01-04 | Discharge: 2023-01-04 | Disposition: A | Discharge status: Home / Self Care | Payer: 59 | Attending: Emergency Medicine | Admitting: Emergency Medicine

## 2023-01-04 DIAGNOSIS — J029 Acute pharyngitis, unspecified: Secondary | ICD-10-CM | POA: Diagnosis not present

## 2023-01-04 DIAGNOSIS — Z91040 Latex allergy status: Secondary | ICD-10-CM | POA: Insufficient documentation

## 2023-01-04 DIAGNOSIS — Z20822 Contact with and (suspected) exposure to covid-19: Secondary | ICD-10-CM | POA: Diagnosis not present

## 2023-01-04 DIAGNOSIS — J020 Streptococcal pharyngitis: Secondary | ICD-10-CM | POA: Diagnosis not present

## 2023-01-04 LAB — RESP PANEL BY RT-PCR (RSV, FLU A&B, COVID)  RVPGX2
Influenza A by PCR: NEGATIVE
Influenza B by PCR: NEGATIVE
Resp Syncytial Virus by PCR: NEGATIVE
SARS Coronavirus 2 by RT PCR: NEGATIVE

## 2023-01-04 LAB — GROUP A STREP BY PCR: Group A Strep by PCR: DETECTED — AB

## 2023-01-04 MED ORDER — IBUPROFEN 800 MG PO TABS: 800.0000 mg | ORAL_TABLET | Freq: Three times a day (TID) | ORAL | 0 refills | Status: AC

## 2023-01-04 MED ORDER — PENICILLIN G BENZATHINE 1200000 UNIT/2ML IM SUSY
1.2000 10*6.[IU] | PREFILLED_SYRINGE | Freq: Once | INTRAMUSCULAR | Status: AC
  Administered 2023-01-04: 1.2 10*6.[IU] via INTRAMUSCULAR
  Filled 2023-01-04: qty 2

## 2023-01-04 MED ORDER — LIDOCAINE VISCOUS HCL 2 % MT SOLN
5.0000 mL | Freq: Three times a day (TID) | ORAL | 0 refills | Status: AC | PRN
Start: 2023-01-04 — End: ?

## 2023-01-04 MED ORDER — BENZONATATE 200 MG PO CAPS: 200.0000 mg | ORAL_CAPSULE | Freq: Three times a day (TID) | ORAL | 0 refills | Status: AC | PRN

## 2023-01-04 NOTE — ED Triage Notes (Signed)
Pt has had a fever, sore throat, and cough for one week. Pt denies nausea and vomiting.

## 2023-01-04 NOTE — Discharge Instructions (Signed)
Your strep test today was positive.  You have been treated with a one-time medication.  I have prescribed a mouthwash to help with your sore throat pain.  Ibuprofen and cough medication.  Drink plenty of fluids.  Follow-up with your primary care provider for recheck if needed.

## 2023-01-04 NOTE — ED Provider Notes (Signed)
[place] EMERGENCY DEPARTMENT AT [facility] Provider Note   CSN: [id number] Arrival date & time: 01/04/23  4098     History  Chief Complaint  Patient presents with   Sore Throat   Cough   Fever    [name] is a 37 y.o. female.   Sore Throat Pertinent negatives include no chest pain, no abdominal pain and no shortness of breath.  Cough Associated symptoms: fever, rhinorrhea and sore throat   Associated symptoms: no chest pain, no rash and no shortness of breath   Fever Associated symptoms: congestion, cough, rhinorrhea and sore throat   Associated symptoms: no chest pain, no diarrhea, no nausea, no rash and no vomiting        [name] is a 37 y.o. female who presents to the Emergency Department complaining of sore throat fever and cough with nasal congestion rhinorrhea.  Symptoms present for a week and a half.  States that she went to a local urgent care and was diagnosed with strep throat.  She had a negative COVID test.  States she was prescribed antibiotics but only took 2 to 3 days of the medications.  Continues to have sore throat.  Cough occasionally productive.  Subjective fever denies any abdominal pain, chest pain, shortness of breath, vomiting or diarrhea.  Her child is also here for evaluation with similar symptoms.  Home Medications Prior to Admission medications   Medication Sig Start Date End Date Taking? Authorizing Provider  Aspirin-Acetaminophen-Caffeine (GOODY HEADACHE PO) Take by mouth.    [provider]  ibuprofen (ADVIL) 600 MG tablet Take 1 tablet (600 mg total) by mouth every 6 (six) hours. 07/05/21   Cresenzo-Dishmon, Scarlette Calico, CNM  metroNIDAZOLE (FLAGYL) 500 MG tablet Take 1 tablet (500 mg total) by mouth 2 (two) times daily. 02/06/22   Cheral Marker, CNM  albuterol (PROVENTIL HFA;VENTOLIN HFA) 108 (90 Base) MCG/ACT inhaler Inhale 2 puffs into the lungs every 6 (six) hours as needed for wheezing or shortness  of breath. Patient not taking: Reported on 12/13/2017 02/06/17 03/14/19  Adline Potter, NP  omeprazole (PRILOSEC) 20 MG capsule Take 1 capsule (20 mg total) by mouth daily. Patient not taking: Reported on 11/16/2017 04/04/17 03/14/19  Cresenzo-Dishmon, Scarlette Calico, CNM      Allergies    Strawberry extract and Latex    Review of Systems   Review of Systems  Constitutional:  Positive for fever. Negative for appetite change.  HENT:  Positive for congestion, rhinorrhea and sore throat. Negative for trouble swallowing and voice change.   Respiratory:  Positive for cough. Negative for shortness of breath.   Cardiovascular:  Negative for chest pain.  Gastrointestinal:  Negative for abdominal pain, diarrhea, nausea and vomiting.  Genitourinary:  Negative for flank pain.  Musculoskeletal:  Negative for arthralgias.  Skin:  Negative for rash.  Neurological:  Negative for dizziness, syncope and weakness.    Physical Exam Updated Vital Signs BP (!) 133/90 (BP Location: Right Arm)   Pulse 94   Temp 98.3 F (36.8 C) (Oral)   Resp 17   SpO2 100%  Physical Exam Vitals and nursing note reviewed.  Constitutional:      General: She is not in acute distress.    Appearance: She is not ill-appearing or toxic-appearing.  HENT:     Nose: Rhinorrhea present.     Mouth/Throat:     Mouth: Mucous membranes are moist.     Pharynx: Posterior oropharyngeal erythema present.  Comments: Erythema of the oropharynx.  No petechiae or exudates.  Uvula midline nonedematous. Cardiovascular:     Rate and Rhythm: Normal rate and regular rhythm.     Pulses: Normal pulses.  Pulmonary:     Effort: Pulmonary effort is normal.  Abdominal:     Palpations: Abdomen is soft.  Musculoskeletal:        General: Normal range of motion.     Cervical back: Normal range of motion. No rigidity.  Lymphadenopathy:     Cervical: Cervical adenopathy present.  Skin:    General: Skin is warm.     Capillary Refill: Capillary  refill takes less than 2 seconds.  Neurological:     General: No focal deficit present.     Mental Status: She is alert.     Sensory: No sensory deficit.     Motor: No weakness.     ED Results / Procedures / Treatments   Labs (all labs ordered are listed, but only abnormal results are displayed) Labs Reviewed  GROUP A STREP BY PCR - Abnormal; Notable for the following components:      Result Value   Group A Strep by PCR DETECTED (*)    All other components within normal limits  RESP PANEL BY RT-PCR (RSV, FLU A&B, COVID)  [id number]    EKG None  Radiology No results found.  Procedures Procedures    Medications Ordered in ED Medications - No data to display  ED Course/ Medical Decision Making/ A&P                                 Medical Decision Making Patient with recent diagnosis of strep throat.  Symptoms for a week and a half.  Subjective fever at home also complains of cough nasal congestion and rhinorrhea.  Similar symptoms have passed through the household.  She also has her son here for evaluation of similar symptoms.  Patient was given prescription for antibiotics for her strep throat but has not taken them as directed.   I suspect symptoms are persistent due to lack of proper treatment.  Patient is requesting to be retested for COVID flu and repeat strep testing.  Lungs are clear to auscultation on my exam, vital signs reassuring.  No indication for need for x-ray at this time.  Amount and/or Complexity of Data Reviewed Labs: ordered.    Details: Respiratory panel negative  Strep PCR positive Discussion of management or test interpretation with external provider(s): Pt prefers IM Bicillin over prescription.  Will continue tylenol ibuprofen if needed and rx for magic mouthwash given.  Appears appropriate for d/c home.    Risk Prescription drug management.           Final Clinical Impression(s) / ED Diagnoses Final diagnoses:  Strep pharyngitis     Rx / DC Orders ED Discharge Orders     None         Pauline Aus, PA-C 01/05/23 1420    Terrilee Files, MD 01/05/23 1724

## 2023-02-14 ENCOUNTER — Ambulatory Visit: Payer: 59 | Admitting: Advanced Practice Midwife

## 2023-03-13 ENCOUNTER — Ambulatory Visit: Payer: 59 | Admitting: Obstetrics & Gynecology

## 2023-04-06 ENCOUNTER — Ambulatory Visit: Payer: 59 | Admitting: Obstetrics & Gynecology

## 2023-04-12 ENCOUNTER — Ambulatory Visit: Admitting: Obstetrics and Gynecology

## 2023-04-17 ENCOUNTER — Encounter: Payer: Self-pay | Admitting: Obstetrics and Gynecology

## 2023-04-17 ENCOUNTER — Other Ambulatory Visit: Payer: Self-pay | Admitting: Obstetrics and Gynecology

## 2023-04-17 ENCOUNTER — Ambulatory Visit: Admitting: Obstetrics and Gynecology

## 2023-04-17 ENCOUNTER — Other Ambulatory Visit (HOSPITAL_COMMUNITY)
Admission: RE | Admit: 2023-04-17 | Discharge: 2023-04-17 | Disposition: A | Discharge status: Home / Self Care | Source: Ambulatory Visit | Attending: Obstetrics and Gynecology | Admitting: Obstetrics and Gynecology

## 2023-04-17 VITALS — BP 127/83 | HR 88 | Ht 62.0 in | Wt 201.0 lb

## 2023-04-17 DIAGNOSIS — N898 Other specified noninflammatory disorders of vagina: Secondary | ICD-10-CM | POA: Diagnosis not present

## 2023-04-17 DIAGNOSIS — Z113 Encounter for screening for infections with a predominantly sexual mode of transmission: Secondary | ICD-10-CM

## 2023-04-17 DIAGNOSIS — Z01419 Encounter for gynecological examination (general) (routine) without abnormal findings: Secondary | ICD-10-CM

## 2023-04-17 MED ORDER — NYSTATIN 100000 UNIT/GM EX CREA: TOPICAL_CREAM | CUTANEOUS | 0 refills | Status: AC

## 2023-04-17 NOTE — Progress Notes (Signed)
 ANNUAL EXAM Patient name: [name] MRN [id number]  Date of birth: [birth year] Chief Complaint:   Gynecologic Exam (Pap 03-16-21 normal. Want check everything, irrtation, itching)  History of Present Illness:   [name] is a 38 y.o. [phone]  female being seen today for a routine annual exam.  Current complaints: Reports vaginal itching and discharge. Normal cycles. Does not have PCP   Patient's last menstrual period was 04/02/2023.   Upstream - 04/17/23 1112       Pregnancy Intention Screening   Does the patient's partner want to become pregnant in the next year? No    Would the patient like to discuss contraceptive options today? No      Contraception Wrap Up   Current Method No Contraceptive Precautions    End Method No Contraception Precautions    Contraception Counseling Provided No            The pregnancy intention screening data noted above was reviewed. Potential methods of contraception were discussed. The patient elected to proceed with No Contraception Precautions.   Last pap 03/16/21. Results were: NILM w/ HRHPV negative. H/O abnormal pap: no Last mammogram: n/a. Results were: N/A. Family h/o breast cancer: yes maternal aunt      04/17/2023   11:13 AM 04/07/2021   12:17 PM 03/16/2021    3:19 PM 03/07/2017    3:55 PM 02/06/2017    4:07 PM  Depression screen PHQ 2/9  Decreased Interest 0 0 0 1 0  Down, Depressed, Hopeless 3 0 0 0 1  PHQ - 2 Score 3 0 0 1 1  Altered sleeping 3 0 0 3 2  Tired, decreased energy 1 0 0 3 1  Change in appetite  0 0 1 1  Feeling bad or failure about yourself  0 0 0 0 0  Trouble concentrating 0 0 0 0 0  Moving slowly or fidgety/restless 0 0 0 0 0  Suicidal thoughts 0 0 0 0 0  PHQ-9 Score 7 0 0 8 5  Difficult doing work/chores    Not difficult at all Not difficult at all        04/17/2023   11:13 AM 04/07/2021   12:18 PM 03/16/2021    3:19 PM  GAD 7 : Generalized Anxiety Score  Nervous, Anxious, on Edge 3 0 3   Control/stop worrying 3 3 3   Worry too much - different things 3 3 3   Trouble relaxing 2 0 0  Restless 2 0 0  Easily annoyed or irritable 3 0 0  Afraid - awful might happen 0 0 0  Total GAD 7 Score 16 6 9      Review of Systems:   Pertinent items are noted in HPI Denies any headaches, blurred vision, fatigue, shortness of breath, chest pain, abdominal pain, abnormal vaginal discharge/itching/odor/irritation, problems with periods, bowel movements, urination, or intercourse unless otherwise stated above. Pertinent History Reviewed:  Reviewed past medical,surgical, social and family history.  Reviewed problem list, medications and allergies. Physical Assessment:   Vitals:   04/17/23 1107 04/17/23 1115  BP: (!) 137/90 127/83  Pulse: 86 88  Weight: 201 lb (91.2 kg)   Height: 5\' 2"  (1.575 m)   Body mass index is 36.76 kg/m.        Physical Examination:   General appearance - well appearing, and in no distress  Mental status - alert, oriented  Psych:  She has a normal mood and affect  Skin - warm and dry,  normal color,  Chest - effort normal, all lung fields clear to auscultation bilaterally  Heart - normal rate and regular rhythm  Neck:  midline trachea, no thyromegaly   Breasts - breasts appear normal, no suspicious masses, no skin or nipple changes or  axillary nodes  Abdomen - soft, nontender  Pelvic - VULVA: normal appearing vulva with no masses, tenderness or lesions  VAGINA: normal appearing vagina with normal color and discharge, no lesions  CERVIX: normal appearing cervix without discharge or lesions, no CMT   UTERUS: uterus is felt to be normal size, shape, consistency and nontender   ADNEXA: No adnexal masses or tenderness noted.  Chaperone present for exam  No results found for this or any previous visit (from the past 24 hours).  Assessment & Plan:  1. Encounter for annual routine gynecological examination (Primary) Not established with PCP, routine blood work  today UTD on pap Hx anxiety, does not desire meds at this time  - CBC - TSH - HgB A1c - Lipid panel - Comp Met (CMET)  2. Screen for STD (sexually transmitted disease)  - RPR+HBsAg+HCVAb+... - Cervicovaginal ancillary only nal ancillary only  3. Vaginal irritation  - nystatin cream (MYCOSTATIN); Apply thin layer externally two times a day as needed  Dispense: 30 g; Refill: 0     Labs/procedures today:   Mammogram: @ 38yo, or sooner if problems   Orders Placed This Encounter  Procedures   RPR+HBsAg+HCVAb+...   CBC   TSH   HgB A1c   Lipid panel   Comp Met (CMET)    Meds:  Meds ordered this encounter  Medications   nystatin cream (MYCOSTATIN)    Sig: Apply thin layer externally two times a day as needed    Dispense:  30 g    Refill:  0    Follow-up: Return in about 1 year (around 04/16/2024) for Thayer Jew, FNP

## 2023-04-18 LAB — COMPREHENSIVE METABOLIC PANEL
ALT: 107 IU/L — ABNORMAL HIGH (ref 0–32)
AST: 107 IU/L — ABNORMAL HIGH (ref 0–40)
Albumin: 4.6 g/dL (ref 3.9–4.9)
Alkaline Phosphatase: 82 IU/L (ref 44–121)
BUN/Creatinine Ratio: 10 (ref 9–23)
BUN: 6 mg/dL (ref 6–20)
Bilirubin Total: 0.5 mg/dL (ref 0.0–1.2)
CO2: 16 mmol/L — ABNORMAL LOW (ref 20–29)
Calcium: 9.5 mg/dL (ref 8.7–10.2)
Chloride: 104 mmol/L (ref 96–106)
Creatinine, Ser: 0.59 mg/dL (ref 0.57–1.00)
Globulin, Total: 3.2 g/dL (ref 1.5–4.5)
Glucose: 106 mg/dL — ABNORMAL HIGH (ref 70–99)
Potassium: 4.1 mmol/L (ref 3.5–5.2)
Sodium: 145 mmol/L — ABNORMAL HIGH (ref 134–144)
Total Protein: 7.8 g/dL (ref 6.0–8.5)
eGFR: 120 mL/min/{1.73_m2} (ref >59)

## 2023-04-18 LAB — LIPID PANEL W/O CHOL/HDL RATIO
Cholesterol, Total: 249 mg/dL — ABNORMAL HIGH (ref 100–199)
HDL: 56 mg/dL (ref >39)
LDL Chol Calc (NIH): 174 mg/dL — ABNORMAL HIGH (ref 0–99)
Triglycerides: 110 mg/dL (ref 0–149)
VLDL Cholesterol Cal: 19 mg/dL (ref 5–40)

## 2023-04-18 LAB — CERVICOVAGINAL ANCILLARY ONLY
Bacterial Vaginitis (gardnerella): POSITIVE — AB
Candida Glabrata: NEGATIVE
Candida Vaginitis: POSITIVE — AB
Chlamydia: NEGATIVE
Comment: NEGATIVE
Comment: NEGATIVE
Comment: NEGATIVE
Comment: NEGATIVE
Comment: NEGATIVE
Comment: NORMAL
Neisseria Gonorrhea: NEGATIVE
Trichomonas: NEGATIVE

## 2023-04-18 LAB — CBC
Hematocrit: 40.2 % (ref 34.0–46.6)
Hemoglobin: 12.9 g/dL (ref 11.1–15.9)
MCH: 27.3 pg (ref 26.6–33.0)
MCHC: 32.1 g/dL (ref 31.5–35.7)
MCV: 85 fL (ref 79–97)
Platelets: 205 10*3/uL (ref 150–450)
RBC: 4.73 x10E6/uL (ref 3.77–5.28)
RDW: 14.4 % (ref 11.7–15.4)
WBC: 3.1 10*3/uL — ABNORMAL LOW (ref 3.4–10.8)

## 2023-04-18 LAB — HEMOGLOBIN A1C
Est. average glucose Bld gHb Est-mCnc: 111 mg/dL
Hgb A1c MFr Bld: 5.5 % (ref 4.8–5.6)

## 2023-04-18 LAB — SPECIMEN STATUS REPORT

## 2023-04-18 LAB — TSH: TSH: 1.13 u[IU]/mL (ref 0.450–4.500)

## 2023-04-19 ENCOUNTER — Other Ambulatory Visit: Payer: Self-pay | Admitting: Obstetrics and Gynecology

## 2023-04-19 MED ORDER — FLUCONAZOLE 150 MG PO TABS: 150.0000 mg | ORAL_TABLET | Freq: Once | ORAL | 1 refills | Status: AC

## 2023-04-19 MED ORDER — METRONIDAZOLE 500 MG PO TABS: 500.0000 mg | ORAL_TABLET | Freq: Two times a day (BID) | ORAL | 0 refills | Status: AC

## 2023-04-23 ENCOUNTER — Ambulatory Visit: Admitting: Women's Health

## 2023-05-08 ENCOUNTER — Other Ambulatory Visit: Payer: Self-pay

## 2023-05-08 ENCOUNTER — Emergency Department (HOSPITAL_COMMUNITY)

## 2023-05-08 ENCOUNTER — Encounter (HOSPITAL_COMMUNITY): Payer: Self-pay | Admitting: *Deleted

## 2023-05-08 ENCOUNTER — Emergency Department (HOSPITAL_COMMUNITY)
Admission: EM | Admit: 2023-05-08 | Discharge: 2023-05-08 | Disposition: A | Discharge status: Home / Self Care | Attending: Emergency Medicine | Admitting: Emergency Medicine

## 2023-05-08 DIAGNOSIS — R0789 Other chest pain: Secondary | ICD-10-CM | POA: Diagnosis not present

## 2023-05-08 DIAGNOSIS — Z91040 Latex allergy status: Secondary | ICD-10-CM | POA: Insufficient documentation

## 2023-05-08 MED ORDER — IBUPROFEN 400 MG PO TABS
600.0000 mg | ORAL_TABLET | Freq: Once | ORAL | Status: AC
  Administered 2023-05-08: 600 mg via ORAL
  Filled 2023-05-08: qty 2

## 2023-05-08 NOTE — ED Provider Notes (Signed)
 [place] EMERGENCY DEPARTMENT AT [facility] Provider Note   CSN: [id number] Arrival date & time: 05/08/23  4098     History  Chief Complaint  Patient presents with   Chest Pain    [name] is a 38 y.o. female.  Patient is a 38 year old female with past medical history of hyperlipidemia, PTSD, and anxiety.  Patient presenting today for evaluation of chest pain.  She describes a sharp pain to the left lateral chest just behind her axilla that began last night.  This started in the absence of any injury or trauma.  She denies any shortness of breath, nausea, diaphoresis, or radiation down the arm or jaw.  No fevers or chills.  She has not tried taking anything for the discomfort.  Patient has no prior cardiac history.  Only cardiac risk factors are hyperlipidemia and tobacco use.       Home Medications Prior to Admission medications   Medication Sig Start Date End Date Taking? Authorizing Provider  benzonatate (TESSALON) 200 MG capsule Take 1 capsule (200 mg total) by mouth 3 (three) times daily as needed for cough. Swallow whole, do not chew Patient not taking: Reported on 04/17/2023 01/04/23   Triplett, Tammy, PA-C  ibuprofen (ADVIL) 800 MG tablet Take 1 tablet (800 mg total) by mouth 3 (three) times daily. Patient not taking: Reported on 04/17/2023 01/04/23   Pauline Aus, PA-C  magic mouthwash (lidocaine, diphenhydrAMINE, alum & mag hydroxide) suspension Swish and spit 5 mLs 3 (three) times daily as needed for mouth pain. Patient not taking: Reported on 04/17/2023 01/04/23   Pauline Aus, PA-C  nystatin cream (MYCOSTATIN) Apply thin layer externally two times a day as needed 04/17/23   Sue Lush, FNP  albuterol (PROVENTIL HFA;VENTOLIN HFA) 108 (90 Base) MCG/ACT inhaler Inhale 2 puffs into the lungs every 6 (six) hours as needed for wheezing or shortness of breath. Patient not taking: Reported on 12/13/2017 02/06/17 03/14/19  Adline Potter, NP   omeprazole (PRILOSEC) 20 MG capsule Take 1 capsule (20 mg total) by mouth daily. Patient not taking: Reported on 11/16/2017 04/04/17 03/14/19  Cresenzo-Dishmon, Scarlette Calico, CNM      Allergies    Strawberry extract and Latex    Review of Systems   Review of Systems  All other systems reviewed and are negative.   Physical Exam Updated Vital Signs BP 123/79   Pulse 72   Temp 98.3 F (36.8 C)   Resp 12   Ht 5\' 2"  (1.575 m)   Wt 91.2 kg   LMP 05/04/2023   SpO2 97%   BMI 36.76 kg/m  Physical Exam Vitals and nursing note reviewed.  Constitutional:      General: She is not in acute distress.    Appearance: She is well-developed. She is not diaphoretic.  HENT:     Head: Normocephalic and atraumatic.  Cardiovascular:     Rate and Rhythm: Normal rate and regular rhythm.     Heart sounds: No murmur heard.    No friction rub. No gallop.  Pulmonary:     Effort: Pulmonary effort is normal. No respiratory distress.     Breath sounds: Normal breath sounds. No wheezing.  Abdominal:     General: Bowel sounds are normal. There is no distension.     Palpations: Abdomen is soft.     Tenderness: There is no abdominal tenderness.  Musculoskeletal:        General: Normal range of motion.     Cervical  back: Normal range of motion and neck supple.  Skin:    General: Skin is warm and dry.  Neurological:     General: No focal deficit present.     Mental Status: She is alert and oriented to person, place, and time.     ED Results / Procedures / Treatments   Labs (all labs ordered are listed, but only abnormal results are displayed) Labs Reviewed - No data to display  EKG EKG Interpretation Date/Time:  Tuesday May 08 2023 03:44:02 EDT Ventricular Rate:  71 PR Interval:  166 QRS Duration:  84 QT Interval:  379 QTC Calculation: 412 R Axis:   49  Text Interpretation: Sinus rhythm Normal ECG Confirmed by Orvilla Blander ([id number]) on 05/08/2023 3:47:02 AM  Radiology No results  found.  Procedures Procedures    Medications Ordered in ED Medications  ibuprofen (ADVIL) tablet 600 mg (has no administration in time range)    ED Course/ Medical Decision Making/ A&P  Patient is a 38 year old female with past medical history as per HPI presenting with chest pain.  She describes a sharp pain to the lateral aspect of the left side of her chest that is worse with movement and palpation.  She arrives with stable vital signs and is afebrile.  Chest x-ray shows no acute process.  Her EKG is normal.  Symptoms are extremely atypical for cardiac pain and I do not feel as though any laboratory studies are necessary.  I highly suspect a musculoskeletal etiology and will recommend NSAIDs, rest, and follow-up as needed.  Final Clinical Impression(s) / ED Diagnoses Final diagnoses:  None    Rx / DC Orders ED Discharge Orders     None         Orvilla Blander, MD 05/08/23 [id number]

## 2023-05-08 NOTE — ED Triage Notes (Signed)
 Pt c/o chest pain that started 3-4 hours pta  Pt c/o some sob  Pt states she had some blurry vision yesterday but since then has resolved

## 2023-05-08 NOTE — Discharge Instructions (Signed)
 Your EKG and chest x-ray are normal.  Your symptoms are most likely musculoskeletal and should improve with anti-inflammatory medications.  Begin taking ibuprofen 600 mg 3 times daily for the next 5 days.  Follow-up with primary doctor if not improving in the next week, and return to the ER if symptoms significantly worsen or change.

## 2023-06-01 ENCOUNTER — Ambulatory Visit

## 2023-06-21 ENCOUNTER — Ambulatory Visit: Admitting: Women's Health

## 2023-06-23 ENCOUNTER — Encounter (HOSPITAL_COMMUNITY): Payer: Self-pay

## 2023-06-23 ENCOUNTER — Other Ambulatory Visit: Payer: Self-pay

## 2023-06-23 ENCOUNTER — Emergency Department (HOSPITAL_COMMUNITY)
Admission: EM | Admit: 2023-06-23 | Discharge: 2023-06-23 | Disposition: A | Discharge status: Home / Self Care | Attending: Emergency Medicine | Admitting: Emergency Medicine

## 2023-06-23 DIAGNOSIS — Z91040 Latex allergy status: Secondary | ICD-10-CM | POA: Diagnosis not present

## 2023-06-23 DIAGNOSIS — K0889 Other specified disorders of teeth and supporting structures: Secondary | ICD-10-CM | POA: Insufficient documentation

## 2023-06-23 MED ORDER — PENICILLIN V POTASSIUM 250 MG PO TABS
500.0000 mg | ORAL_TABLET | Freq: Once | ORAL | Status: AC
  Administered 2023-06-23: 500 mg via ORAL
  Filled 2023-06-23: qty 2

## 2023-06-23 MED ORDER — OXYCODONE-ACETAMINOPHEN 5-325 MG PO TABS
1.0000 | ORAL_TABLET | Freq: Once | ORAL | Status: AC
  Administered 2023-06-23: 1 via ORAL
  Filled 2023-06-23: qty 1

## 2023-06-23 MED ORDER — HYDROCODONE-ACETAMINOPHEN 5-325 MG PO TABS: ORAL_TABLET | ORAL | 0 refills | Status: AC

## 2023-06-23 MED ORDER — PENICILLIN V POTASSIUM 500 MG PO TABS: 500.0000 mg | ORAL_TABLET | Freq: Three times a day (TID) | ORAL | 0 refills | Status: AC

## 2023-06-23 NOTE — ED Provider Notes (Signed)
 [place] EMERGENCY DEPARTMENT AT [facility] Provider Note   CSN: [id number] Arrival date & time: 06/23/23  0940     History  Chief Complaint  Patient presents with   Dental Pain    [name] is a 38 y.o. female.   Dental Pain Associated symptoms: no facial swelling, no fever and no neck pain        [name] is a 38 y.o. female who presents to the Emergency Department complaining of left lower dental pain for 1 day.  She reports dental caries for some time, began having significant pain to the left lower tooth 2 days ago, that worsened this morning.  Significant pain with chewing or exposure to cold liquids or cold air.  Denies any facial swelling, neck pain, fever, chills, difficulty swallowing.  She does have a dentist that she can follow-up with.  Took over-the-counter pain medicine this morning without relief  Home Medications Prior to Admission medications   Medication Sig Start Date End Date Taking? Authorizing Provider  benzonatate  (TESSALON ) 200 MG capsule Take 1 capsule (200 mg total) by mouth 3 (three) times daily as needed for cough. Swallow whole, do not chew Patient not taking: Reported on 04/17/2023 01/04/23   Tacy Chavis, PA-C  ibuprofen  (ADVIL ) 800 MG tablet Take 1 tablet (800 mg total) by mouth 3 (three) times daily. Patient not taking: Reported on 04/17/2023 01/04/23   Catherne Clubs, PA-C  magic mouthwash (lidocaine , diphenhydrAMINE , alum & mag hydroxide) suspension Swish and spit 5 mLs 3 (three) times daily as needed for mouth pain. Patient not taking: Reported on 04/17/2023 01/04/23   Catherne Clubs, PA-C  nystatin  cream (MYCOSTATIN ) Apply thin layer externally two times a day as needed 04/17/23   Zelma Hidden, FNP  albuterol  (PROVENTIL  HFA;VENTOLIN  HFA) 108 (90 Base) MCG/ACT inhaler Inhale 2 puffs into the lungs every 6 (six) hours as needed for wheezing or shortness of breath. Patient not taking: Reported on 12/13/2017  02/06/17 03/14/19  Javan Messing, NP  omeprazole  (PRILOSEC) 20 MG capsule Take 1 capsule (20 mg total) by mouth daily. Patient not taking: Reported on 11/16/2017 04/04/17 03/14/19  Cresenzo-Dishmon, Blanca Bunch, CNM      Allergies    Strawberry extract and Latex    Review of Systems   Review of Systems  Constitutional:  Negative for appetite change, chills and fever.  HENT:  Positive for dental problem and ear pain. Negative for facial swelling, sore throat and trouble swallowing.   Respiratory:  Negative for shortness of breath.   Cardiovascular:  Negative for chest pain.  Gastrointestinal:  Negative for abdominal pain, nausea and vomiting.  Musculoskeletal:  Negative for neck pain.  Neurological:  Negative for dizziness, weakness and numbness.    Physical Exam Updated Vital Signs BP (!) 149/100 (BP Location: Right Arm)   Pulse 87   Temp 97.9 F (36.6 C)   Resp 16   Ht 5\' 2"  (1.575 m)   Wt 91.2 kg   LMP 05/30/2023   SpO2 100%   BMI 36.76 kg/m  Physical Exam Vitals and nursing note reviewed.  Constitutional:      General: She is not in acute distress.    Appearance: Normal appearance. She is not ill-appearing or toxic-appearing.  HENT:     Right Ear: Tympanic membrane and ear canal normal.     Left Ear: Tympanic membrane and ear canal normal.     Mouth/Throat:     Mouth: Mucous membranes are moist.  Pharynx: Oropharynx is clear.      Comments: Several dental caries, tenderness to palpation left lower #22.  Tooth is decayed to the gumline.  Mild area edema of the surrounding gingiva without edema or fluctuance.  No facial edema.  Uvula midline nonedematous.  No trismus, no sublingual abnormality Cardiovascular:     Rate and Rhythm: Normal rate and regular rhythm.     Pulses: Normal pulses.  Pulmonary:     Effort: Pulmonary effort is normal.  Musculoskeletal:        General: Normal range of motion.  Skin:    General: Skin is warm.     Capillary Refill: Capillary  refill takes less than 2 seconds.  Neurological:     Mental Status: She is alert.     ED Results / Procedures / Treatments   Labs (all labs ordered are listed, but only abnormal results are displayed) Labs Reviewed - No data to display  EKG None  Radiology No results found.  Procedures Procedures    Medications Ordered in ED Medications  oxyCODONE -acetaminophen  (PERCOCET/ROXICET) 5-325 MG per tablet 1 tablet (has no administration in time range)  penicillin  v potassium (VEETID) tablet 500 mg (has no administration in time range)    ED Course/ Medical Decision Making/ A&P                                 Medical Decision Making Patient here with dental pain for 2 days worse since this morning.  History of dental caries, no facial swelling difficulty swallowing neck pain fever or chills.  There is tenderness of #22 tooth on exam no fluctuance or obvious dental abscess.  There is no abnormalities to the floor of the mouth and her pain radiates toward her left ear.  No evidence on exam for otitis.  Likely secondary to her dental pain.  No exam findings suggestive of dental abscess at this time, Ludewig's also considered but felt less likely.  No indication for imaging at this time.  Will address her pain and prescribe antibiotics.  She does have a dentist that she can follow-up with next week.  Amount and/or Complexity of Data Reviewed Discussion of management or test interpretation with external provider(s): Dental pain without evidence of abscess or Ludewig's.  Will treat with antibiotics and short course of pain medication, database reviewed.  She will follow-up with her dentist next week.  Risk Prescription drug management.           Final Clinical Impression(s) / ED Diagnoses Final diagnoses:  Pain, dental    Rx / DC Orders ED Discharge Orders     None         Catherne Clubs, PA-C 06/23/23 1125    Deatra Face, MD 06/24/23 0730

## 2023-06-23 NOTE — Discharge Instructions (Signed)
 Take the antibiotic as directed until finished, you have been prescribed pain medication that may help with your symptoms as well.  You may also take over-the-counter ibuprofen  as directed if needed.  Warm salt water rinses to your mouth, you may also use over-the-counter Orajel as we discussed.  Please call your dentist on Monday to arrange follow-up appointment.

## 2023-06-23 NOTE — ED Triage Notes (Signed)
 Pt reports left lower tooth pain and swelling x 2 days.

## 2023-06-25 ENCOUNTER — Ambulatory Visit

## 2023-06-25 DIAGNOSIS — K0381 Cracked tooth: Secondary | ICD-10-CM | POA: Diagnosis not present

## 2023-06-25 DIAGNOSIS — K122 Cellulitis and abscess of mouth: Secondary | ICD-10-CM | POA: Diagnosis not present

## 2023-06-25 DIAGNOSIS — R030 Elevated blood-pressure reading, without diagnosis of hypertension: Secondary | ICD-10-CM | POA: Diagnosis not present

## 2023-07-10 ENCOUNTER — Other Ambulatory Visit (HOSPITAL_COMMUNITY)
Admission: RE | Admit: 2023-07-10 | Discharge: 2023-07-10 | Disposition: A | Discharge status: Home / Self Care | Source: Ambulatory Visit | Attending: Obstetrics & Gynecology | Admitting: Obstetrics & Gynecology

## 2023-07-10 ENCOUNTER — Ambulatory Visit

## 2023-07-10 DIAGNOSIS — N898 Other specified noninflammatory disorders of vagina: Secondary | ICD-10-CM

## 2023-07-10 DIAGNOSIS — R102 Pelvic and perineal pain: Secondary | ICD-10-CM | POA: Diagnosis not present

## 2023-07-10 NOTE — Progress Notes (Signed)
   NURSE VISIT- VAGINITIS/STD/POC  SUBJECTIVE:  [name] is a 38 y.o. [id number] GYN patientfemale here for a vaginal swab for vaginitis screening, STD screen.  She reports the following symptoms: local irritation for several days. Denies abnormal vaginal bleeding, significant pelvic pain, fever, or UTI symptoms.  OBJECTIVE:  LMP 05/30/2023   Appears well, in no apparent distress  ASSESSMENT: Vaginal swab for vaginitis screening  PLAN: Self-collected vaginal probe for Gonorrhea, Chlamydia, Trichomonas, Bacterial Vaginosis, Yeast sent to lab Treatment: to be determined once results are received Follow-up as needed if symptoms persist/worsen, or new symptoms develop  Also sent urine for culture.   Kerrie Peek  07/10/2023 11:59 AM

## 2023-07-11 ENCOUNTER — Ambulatory Visit: Payer: Self-pay | Admitting: Obstetrics & Gynecology

## 2023-07-11 DIAGNOSIS — B9689 Other specified bacterial agents as the cause of diseases classified elsewhere: Secondary | ICD-10-CM

## 2023-07-11 LAB — CERVICOVAGINAL ANCILLARY ONLY
Bacterial Vaginitis (gardnerella): POSITIVE — AB
Candida Glabrata: NEGATIVE
Candida Vaginitis: NEGATIVE
Chlamydia: NEGATIVE
Comment: NEGATIVE
Comment: NEGATIVE
Comment: NEGATIVE
Comment: NEGATIVE
Comment: NEGATIVE
Comment: NORMAL
Neisseria Gonorrhea: NEGATIVE
Trichomonas: NEGATIVE

## 2023-07-11 MED ORDER — METRONIDAZOLE 500 MG PO TABS: 500.0000 mg | ORAL_TABLET | Freq: Two times a day (BID) | ORAL | 2 refills | Status: AC

## 2023-07-12 LAB — URINE CULTURE

## 2023-09-01 DIAGNOSIS — F419 Anxiety disorder, unspecified: Secondary | ICD-10-CM | POA: Diagnosis not present

## 2023-09-01 DIAGNOSIS — A084 Viral intestinal infection, unspecified: Secondary | ICD-10-CM | POA: Diagnosis not present

## 2023-09-01 DIAGNOSIS — R197 Diarrhea, unspecified: Secondary | ICD-10-CM | POA: Diagnosis not present

## 2023-11-27 ENCOUNTER — Telehealth (HOSPITAL_BASED_OUTPATIENT_CLINIC_OR_DEPARTMENT_OTHER): Payer: Self-pay

## 2023-11-27 NOTE — Telephone Encounter (Signed)
 done

## 2023-12-31 ENCOUNTER — Ambulatory Visit

## 2024-01-04 ENCOUNTER — Other Ambulatory Visit (HOSPITAL_COMMUNITY)
Admission: RE | Admit: 2024-01-04 | Discharge: 2024-01-04 | Disposition: A | Source: Ambulatory Visit | Attending: Obstetrics & Gynecology | Admitting: Obstetrics & Gynecology

## 2024-01-04 ENCOUNTER — Ambulatory Visit

## 2024-01-04 DIAGNOSIS — Z113 Encounter for screening for infections with a predominantly sexual mode of transmission: Secondary | ICD-10-CM

## 2024-01-04 DIAGNOSIS — R350 Frequency of micturition: Secondary | ICD-10-CM | POA: Diagnosis not present

## 2024-01-04 DIAGNOSIS — N9489 Other specified conditions associated with female genital organs and menstrual cycle: Secondary | ICD-10-CM

## 2024-01-04 DIAGNOSIS — R300 Dysuria: Secondary | ICD-10-CM | POA: Diagnosis not present

## 2024-01-04 NOTE — Progress Notes (Signed)
° °  NURSE VISIT- UTI SYMPTOMS   SUBJECTIVE:  [name] is a 38 y.o. [phone] female here for UTI symptoms. She is a GYN patient. She reports dysuria and urinary frequency. Patient also wanted to do a vaginal swab. Wants to be tested for everything.   OBJECTIVE:  There were no vitals taken for this visit.  Appears well, in no apparent distress  No results found for this or any previous visit (from the past 24 hours).  ASSESSMENT: GYN patient with UTI symptoms and negative nitrites  PLAN: Note routed to Delon Lewis, NP   Rx sent by provider today: No Urine culture sent Swab sent as well Call or return to clinic prn if these symptoms worsen or fail to improve as anticipated. Follow-up: as needed   [name]  01/04/2024 10:38 AM

## 2024-01-05 LAB — URINALYSIS, ROUTINE W REFLEX MICROSCOPIC
Bilirubin, UA: NEGATIVE
Glucose, UA: NEGATIVE
Ketones, UA: NEGATIVE
Nitrite, UA: NEGATIVE
Protein,UA: NEGATIVE
RBC, UA: NEGATIVE
Specific Gravity, UA: 1.008 (ref 1.005–1.030)
Urobilinogen, Ur: 0.2 mg/dL (ref 0.2–1.0)
pH, UA: 6.5 (ref 5.0–7.5)

## 2024-01-05 LAB — MICROSCOPIC EXAMINATION: Casts: NONE SEEN /LPF

## 2024-01-06 LAB — URINE CULTURE

## 2024-01-07 ENCOUNTER — Ambulatory Visit: Payer: Self-pay | Admitting: Adult Health

## 2024-01-07 LAB — CERVICOVAGINAL ANCILLARY ONLY
Bacterial Vaginitis (gardnerella): POSITIVE — AB
Candida Glabrata: NEGATIVE
Candida Vaginitis: POSITIVE — AB
Chlamydia: NEGATIVE
Comment: NEGATIVE
Comment: NEGATIVE
Comment: NEGATIVE
Comment: NEGATIVE
Comment: NEGATIVE
Comment: NORMAL
Neisseria Gonorrhea: NEGATIVE
Trichomonas: NEGATIVE

## 2024-01-07 MED ORDER — FLUCONAZOLE 150 MG PO TABS: ORAL_TABLET | ORAL | 1 refills | Status: AC

## 2024-01-07 MED ORDER — METRONIDAZOLE 500 MG PO TABS: 500.0000 mg | ORAL_TABLET | Freq: Two times a day (BID) | ORAL | 0 refills | Status: AC

## 2024-01-31 ENCOUNTER — Other Ambulatory Visit: Payer: Self-pay | Admitting: Obstetrics and Gynecology
